# Patient Record
Sex: Male | Born: 1945 | Race: White | Hispanic: No | Marital: Married | Smoking: Former smoker
Health system: Southern US, Community
[De-identification: ages and names within clinical notes are randomized; demographics above are authoritative.]

## PROBLEM LIST (undated history)

## (undated) DIAGNOSIS — I2510 Atherosclerotic heart disease of native coronary artery without angina pectoris: Secondary | ICD-10-CM

## (undated) DIAGNOSIS — I10 Essential (primary) hypertension: Secondary | ICD-10-CM

## (undated) DIAGNOSIS — M889 Osteitis deformans of unspecified bone: Secondary | ICD-10-CM

## (undated) DIAGNOSIS — R42 Dizziness and giddiness: Secondary | ICD-10-CM

## (undated) DIAGNOSIS — Z860100 Personal history of colon polyps, unspecified: Secondary | ICD-10-CM

## (undated) DIAGNOSIS — D649 Anemia, unspecified: Secondary | ICD-10-CM

## (undated) DIAGNOSIS — K529 Noninfective gastroenteritis and colitis, unspecified: Secondary | ICD-10-CM

## (undated) DIAGNOSIS — K219 Gastro-esophageal reflux disease without esophagitis: Secondary | ICD-10-CM

## (undated) DIAGNOSIS — M549 Dorsalgia, unspecified: Secondary | ICD-10-CM

## (undated) DIAGNOSIS — M2550 Pain in unspecified joint: Secondary | ICD-10-CM

## (undated) DIAGNOSIS — E785 Hyperlipidemia, unspecified: Secondary | ICD-10-CM

## (undated) DIAGNOSIS — I219 Acute myocardial infarction, unspecified: Secondary | ICD-10-CM

## (undated) DIAGNOSIS — Z8709 Personal history of other diseases of the respiratory system: Secondary | ICD-10-CM

## (undated) DIAGNOSIS — J45909 Unspecified asthma, uncomplicated: Secondary | ICD-10-CM

## (undated) DIAGNOSIS — K649 Unspecified hemorrhoids: Secondary | ICD-10-CM

## (undated) DIAGNOSIS — H269 Unspecified cataract: Secondary | ICD-10-CM

## (undated) DIAGNOSIS — J069 Acute upper respiratory infection, unspecified: Secondary | ICD-10-CM

## (undated) DIAGNOSIS — R0602 Shortness of breath: Secondary | ICD-10-CM

## (undated) DIAGNOSIS — M1990 Unspecified osteoarthritis, unspecified site: Secondary | ICD-10-CM

## (undated) DIAGNOSIS — J449 Chronic obstructive pulmonary disease, unspecified: Secondary | ICD-10-CM

## (undated) DIAGNOSIS — K5790 Diverticulosis of intestine, part unspecified, without perforation or abscess without bleeding: Secondary | ICD-10-CM

## (undated) HISTORY — PX: BACK SURGERY: SHX140

## (undated) HISTORY — PX: OTHER SURGICAL HISTORY: SHX169

## (undated) HISTORY — PX: CARPAL TUNNEL RELEASE: SHX101

## (undated) HISTORY — PX: ESOPHAGOGASTRODUODENOSCOPY: SHX1529

## (undated) HISTORY — PX: JOINT REPLACEMENT: SHX530

## (undated) HISTORY — PX: APPENDECTOMY: SHX54

## (undated) HISTORY — PX: ABDOMINAL EXPLORATION SURGERY: SHX538

## (undated) HISTORY — PX: CORONARY ANGIOPLASTY: SHX604

## (undated) HISTORY — PX: HERNIA REPAIR: SHX51

## (undated) HISTORY — DX: Atherosclerotic heart disease of native coronary artery without angina pectoris: I25.10

## (undated) HISTORY — PX: COLONOSCOPY: SHX174

## (undated) HISTORY — PX: DENTAL SURGERY: SHX609

---

## 2000-05-30 ENCOUNTER — Encounter: Payer: Self-pay | Admitting: Oral Surgery

## 2000-05-30 ENCOUNTER — Ambulatory Visit (HOSPITAL_COMMUNITY): Admission: RE | Admit: 2000-05-30 | Discharge: 2000-05-30 | Payer: Self-pay | Admitting: Oral Surgery

## 2000-11-27 ENCOUNTER — Ambulatory Visit (HOSPITAL_BASED_OUTPATIENT_CLINIC_OR_DEPARTMENT_OTHER): Admission: RE | Admit: 2000-11-27 | Discharge: 2000-11-27 | Payer: Self-pay | Admitting: Orthopedic Surgery

## 2001-06-02 ENCOUNTER — Ambulatory Visit (HOSPITAL_BASED_OUTPATIENT_CLINIC_OR_DEPARTMENT_OTHER): Admission: RE | Admit: 2001-06-02 | Discharge: 2001-06-02 | Payer: Self-pay | Admitting: Orthopedic Surgery

## 2002-01-07 ENCOUNTER — Encounter: Payer: Self-pay | Admitting: Orthopedic Surgery

## 2002-01-12 ENCOUNTER — Inpatient Hospital Stay (HOSPITAL_COMMUNITY): Admission: RE | Admit: 2002-01-12 | Discharge: 2002-01-16 | Payer: Self-pay | Admitting: Orthopedic Surgery

## 2002-01-12 HISTORY — PX: TOTAL KNEE ARTHROPLASTY: SHX125

## 2002-01-20 ENCOUNTER — Emergency Department (HOSPITAL_COMMUNITY): Admission: EM | Admit: 2002-01-20 | Discharge: 2002-01-20 | Payer: Self-pay | Admitting: Emergency Medicine

## 2002-01-20 ENCOUNTER — Encounter: Payer: Self-pay | Admitting: Emergency Medicine

## 2002-09-17 DIAGNOSIS — I219 Acute myocardial infarction, unspecified: Secondary | ICD-10-CM

## 2002-09-17 HISTORY — DX: Acute myocardial infarction, unspecified: I21.9

## 2003-08-07 ENCOUNTER — Inpatient Hospital Stay (HOSPITAL_COMMUNITY): Admission: EM | Admit: 2003-08-07 | Discharge: 2003-08-10 | Payer: Self-pay | Admitting: Emergency Medicine

## 2003-09-01 ENCOUNTER — Ambulatory Visit (HOSPITAL_COMMUNITY): Admission: RE | Admit: 2003-09-01 | Discharge: 2003-09-02 | Payer: Self-pay | Admitting: *Deleted

## 2004-09-22 ENCOUNTER — Ambulatory Visit: Payer: Self-pay | Admitting: *Deleted

## 2005-05-23 ENCOUNTER — Ambulatory Visit: Payer: Self-pay | Admitting: Cardiology

## 2005-05-29 ENCOUNTER — Ambulatory Visit: Payer: Self-pay

## 2006-05-22 ENCOUNTER — Ambulatory Visit: Payer: Self-pay | Admitting: Cardiology

## 2006-05-23 ENCOUNTER — Ambulatory Visit: Payer: Self-pay | Admitting: Cardiology

## 2007-05-21 ENCOUNTER — Ambulatory Visit: Payer: Self-pay | Admitting: Cardiology

## 2007-05-29 ENCOUNTER — Ambulatory Visit: Payer: Self-pay | Admitting: Cardiology

## 2007-06-03 ENCOUNTER — Ambulatory Visit: Payer: Self-pay

## 2007-06-03 ENCOUNTER — Ambulatory Visit: Payer: Self-pay | Admitting: Cardiology

## 2007-06-09 ENCOUNTER — Inpatient Hospital Stay (HOSPITAL_BASED_OUTPATIENT_CLINIC_OR_DEPARTMENT_OTHER): Admission: RE | Admit: 2007-06-09 | Discharge: 2007-06-09 | Payer: Self-pay | Admitting: Cardiology

## 2007-06-09 ENCOUNTER — Ambulatory Visit: Payer: Self-pay | Admitting: Cardiology

## 2007-06-16 ENCOUNTER — Ambulatory Visit: Payer: Self-pay | Admitting: Cardiology

## 2008-05-19 ENCOUNTER — Ambulatory Visit: Payer: Self-pay | Admitting: Cardiology

## 2009-03-17 ENCOUNTER — Telehealth: Payer: Self-pay | Admitting: Cardiology

## 2009-03-24 ENCOUNTER — Encounter (INDEPENDENT_AMBULATORY_CARE_PROVIDER_SITE_OTHER): Payer: Self-pay | Admitting: *Deleted

## 2009-04-07 ENCOUNTER — Telehealth: Payer: Self-pay | Admitting: Cardiology

## 2009-04-08 ENCOUNTER — Encounter: Payer: Self-pay | Admitting: Cardiology

## 2009-05-09 DIAGNOSIS — M129 Arthropathy, unspecified: Secondary | ICD-10-CM | POA: Insufficient documentation

## 2009-05-09 DIAGNOSIS — K219 Gastro-esophageal reflux disease without esophagitis: Secondary | ICD-10-CM | POA: Insufficient documentation

## 2009-06-06 ENCOUNTER — Ambulatory Visit: Payer: Self-pay | Admitting: Cardiology

## 2009-09-15 ENCOUNTER — Telehealth: Payer: Self-pay | Admitting: Cardiology

## 2009-10-31 ENCOUNTER — Ambulatory Visit: Payer: Self-pay | Admitting: Cardiology

## 2009-10-31 ENCOUNTER — Encounter (HOSPITAL_COMMUNITY): Admission: RE | Admit: 2009-10-31 | Discharge: 2009-11-30 | Payer: Self-pay | Admitting: Cardiology

## 2009-11-01 ENCOUNTER — Encounter (INDEPENDENT_AMBULATORY_CARE_PROVIDER_SITE_OTHER): Payer: Self-pay | Admitting: *Deleted

## 2010-05-04 ENCOUNTER — Telehealth (INDEPENDENT_AMBULATORY_CARE_PROVIDER_SITE_OTHER): Payer: Self-pay | Admitting: *Deleted

## 2010-06-12 ENCOUNTER — Telehealth (INDEPENDENT_AMBULATORY_CARE_PROVIDER_SITE_OTHER): Payer: Self-pay | Admitting: *Deleted

## 2010-07-10 ENCOUNTER — Ambulatory Visit: Payer: Self-pay | Admitting: Cardiology

## 2010-07-10 DIAGNOSIS — I2510 Atherosclerotic heart disease of native coronary artery without angina pectoris: Secondary | ICD-10-CM

## 2010-10-19 NOTE — Assessment & Plan Note (Signed)
Summary: W2N   Primary Provider:  Dr. Wyvonnia Lora   History of Present Illness: [name] comes in today for further evaluation and management of his coronary artery disease.  He developed severe bronchitis and had to be admitted to [facility] in February. This was shortly after he had a stress test here. His stress test was remarkable for good LV function, no ischemia, and no infarction. His ejection fraction was 61%. He had a gastrointestinal bug the night before and got nauseated and possibly aspirated after the study. Chest x-ray our showed no infiltrate on admission to [facility].  He is having no angina or ischemic symptoms. He may have had total knee replacement in the future.  He's had some aching and thinks it may be simvastatin. Hip problem the past with Lipitor. He would rather stay on the medication this time.  He is very compliant with his medications. His wife confirms this.  Preventive Screening-Counseling & Management  Alcohol-Tobacco     Smoking Status: quit  Current Medications (verified): 1)  Metoprolol Tartrate 25 Mg Tabs (Metoprolol Tartrate) .... Take 1/2 Tablet By Mouth Twice A Day 2)  Nitroglycerin 0.4 Mg Subl (Nitroglycerin) .... Place 1 Tablet Under Tongue As Directed 3)  Prilosec 40 Mg Cpdr (Omeprazole) .... Take 1 Tablet By Mouth Once A Day 4)  Aspirin Ec 325 Mg Tbec (Aspirin) .... Take One Tablet By Mouth Daily 5)  Multivitamins  Tabs (Multiple Vitamin) .... Take 1 Tablet By Mouth Once A Day 6)  Cozaar 50 Mg Tabs (Losartan Potassium) .... Take 1 Tablet By Mouth Once A Day' 7)  Simvastatin 40 Mg Tabs (Simvastatin) .... Take 1 Tablet By Mouth Once Daily  Allergies (verified): 1)  ! * Codiene  Past History:  Past Medical History: Last updated: May 14, 2009 Current Problems:  GERD (ICD-530.81) ARTHRITIS (ICD-716.90)  Past Surgical History: Last updated: 2009/05/14 myocardial infarction cath with stenting   Family History: Last  updated: 2009-05-14 Father:deceased cause unknown Mother:deceased cause unknown  Social History: Last updated: 14-May-2009 Full Time Married  Tobacco Use - No.  Alcohol Use - no Regular Exercise - no Drug Use - no  Risk Factors: Exercise: no (May 14, 2009)  Risk Factors: Smoking Status: quit (07/10/2010)  Social History: Smoking Status:  quit  Review of Systems       negative other than history of present illness  Vital Signs:  Patient profile:   65 year old male Height:      70 inches Weight:      208 pounds BMI:     29.95 O2 Sat:      95 % on Room air Pulse rate:   63 / minute BP sitting:   136 / 78  (left arm)  Vitals Entered ByLarita Fife Via LPN (July 10, 2010 1:51 PM)  O2 Flow:  Room air  Physical Exam  General:  Well developed, well nourished, in no acute distress. Head:  normocephalic and atraumatic Eyes:  PERRLA/EOM intact; conjunctiva and lids normal. Neck:  Neck supple, no JVD. No masses, thyromegaly or abnormal cervical nodes. Chest Wall:  no deformities or breast masses noted Lungs:  Clear bilaterally to auscultation and percussion. Heart:  PMI nondisplaced, normal S1-S2, no murmur or gallop. Carotids equal bilaterally without bruit Abdomen:  positive bowel sounds, soft, no bruit Msk:  Back normal, normal gait. Muscle strength and tone normal. Pulses:  pulses normal in all 4 extremities Extremities:  No clubbing or cyanosis. Neurologic:  Alert and oriented x 3. Skin:  Intact without lesions or rashes. Psych:  Normal affect.   Problems:  Medical Problems Added: 1)  Dx of Cad, Native Vessel  (ICD-414.01)  Impression & Recommendations:  Problem # 1:  CAD, NATIVE VESSEL (ICD-414.01) continue medical therapy, followup in a year. His updated medication list for this problem includes:    Metoprolol Tartrate 25 Mg Tabs (Metoprolol tartrate) .Marland Kitchen... Take 1/2 tablet by mouth twice a day    Nitroglycerin 0.4 Mg Subl (Nitroglycerin) .Marland Kitchen... Place 1 tablet  under tongue as directed    Aspirin Ec 325 Mg Tbec (Aspirin) .Marland Kitchen... Take one tablet by mouth daily  Patient Instructions: 1)  Your physician recommends that you schedule a follow-up appointment in: 12 months 2)  Your physician recommends that you continue on your current medications as directed. Please refer to the Current Medication list given to you today. Prescriptions: METOPROLOL TARTRATE 25 MG TABS (METOPROLOL TARTRATE) Take 1/2 tablet by mouth twice a day  #30 x 11   Entered by:   Larita Fife Via LPN   Authorized by:   Gaylord Shih, MD, [facility]   Signed by:   Larita Fife Via LPN on 16/06/9603   Method used:   Electronically to        [facility] [address] (retail)       [address]       [place], [place]  [address]       Ph: [phone] or [phone]       Fax: [phone]   RxID:   [id number] SIMVASTATIN 40 MG TABS (SIMVASTATIN) take 1 tablet by mouth once daily  #30 x 11   Entered by:   Larita Fife Via LPN   Authorized by:   Gaylord Shih, MD, [facility]   Signed by:   Larita Fife Via LPN on 40/34/7425   Method used:   Electronically to        [facility] [address] (retail)       [address]       [place], [place]  [address]       Ph: [phone] or [phone]       Fax: [phone]   RxID:   [id number]   Prevention & Chronic Care Immunizations   Influenza vaccine: Not documented    Tetanus booster: Not documented    Pneumococcal vaccine: Not documented    H. zoster vaccine: Not documented  Colorectal Screening   Hemoccult: Not documented    Colonoscopy: Not documented  Other Screening   PSA: Not documented   Smoking status: quit  (07/10/2010)    Screening comments: quit in 2000 at age 52  Lipids   Total Cholesterol: Not documented   LDL: Not documented   LDL Direct: Not documented   HDL: Not documented   Triglycerides: Not documented

## 2010-10-19 NOTE — Progress Notes (Signed)
Summary: simvastatin refill  Phone Note Call from Patient Call back at Home Phone [phone] Call back at [phone]   Caller: pt Reason for Call: Refill Medication Summary of Call: pt uses [facility] and needs his simvastatin 40mg  called in, has been trying to get from pharmacy since monday. Initial call taken by: Faythe Ghee,  May 04, 2010 2:50 PM    Prescriptions: SIMVASTATIN 40 MG TABS (SIMVASTATIN) take 1 tablet by mouth once daily  #30 x 3   Entered by:   Teressa Lower RN   Authorized by:   Gaylord Shih, MD, [facility]   Signed by:   Teressa Lower RN on 05/04/2010   Method used:   Electronically to        [facility] [address] (retail)       [address]       [place], [place]  [address]       Ph: [phone] or [phone]       Fax: [phone]   RxID:   [id number]

## 2010-10-19 NOTE — Progress Notes (Signed)
  Phone Note Call from Patient   Caller: [name] [name],wife Reason for Call: Talk to Nurse Summary of Call: [name] [name] said the pharmacy has not heard back form their request to refill "Metoprolot tarrate 25mg .  [name] [name] said that he has refills left but the date ran out.  He uses [facility] at [place], [phone].  [name] [name] can be reached at [phone]. [name] [name] can be reached at [phone].    Prescriptions: METOPROLOL TARTRATE 25 MG TABS (METOPROLOL TARTRATE) Take 1/2 tablet by mouth twice a day  #30 x 1   Entered by:   Teressa Lower RN   Authorized by:   Gaylord Shih, MD, [facility]   Signed by:   Teressa Lower RN on 06/12/2010   Method used:   Electronically to        [facility] [address] (retail)       [address]       [place], [place]  [address]       Ph: [phone] or [phone]       Fax: [phone]   RxID:   [id number]

## 2010-10-19 NOTE — Letter (Signed)
Summary: [place] [facility] at [facility]  [address] [place] [address]   Phone: [phone]  Fax: [phone]      November 01, 2009 MRN: [id number]   [name] [name] [address] [place], [place]  [address]   Dear Mr. Mabey,  Your test ordered by Selena Batten has been reviewed by your physician (or physician assistant) and was found to be normal or stable. Your physician (or physician assistant) felt no changes were needed at this time.  ____ Echocardiogram  __x__ Cardiac Stress Test  ____ Lab Work  ____ Peripheral vascular study of arms, legs or neck  ____ CT scan or X-ray  ____ Lung or Breathing test  ____ Other:  No change in medical treatment at this time, excellent per Dr. Juanito Doom.  Thank you, Caspian Deleonardis Allyne Gee RN    Chewton Bing, MD, Lenise Arena.C.Gaylord Shih, MD, F.A.C.C Lewayne Bunting, MD, F.A.C.C Nona Dell, MD, F.A.C.C Charlton Haws, MD, Lenise Arena.C.C

## 2010-11-24 ENCOUNTER — Ambulatory Visit (INDEPENDENT_AMBULATORY_CARE_PROVIDER_SITE_OTHER): Payer: Self-pay | Admitting: Emergency Medicine

## 2010-11-24 DIAGNOSIS — N452 Orchitis: Secondary | ICD-10-CM

## 2011-01-30 NOTE — Assessment & Plan Note (Signed)
[facility]                            CARDIOLOGY OFFICE NOTE   [name] [name], [name] [name]                       MRN:          [id number]  DATE:06/16/2007                            DOB:          May 28, 1946    [name] [name] comes in today post catheterization.  He was having some  dyspnea on exertion and some chest tightness.  He had a low risk Myoview  with what appeared to be some question of inferior wall ischemia.  He  had previous stenting of his circumflex and LAD.  He underwent an  outpatient cardiac cath which showed patent stents both in the  circumflex and the LAD.  He had nonobstructive plaque otherwise.  His EF  was 60% with minimal mitral regurgitation.  He feels much better knowing  that his coronaries are doing well.   MEDICATIONS:  Unchanged.   PHYSICAL EXAMINATION:  VITAL SIGNS:  His blood pressure today is 140/72,  pulse is 72 and regular, his weight is 202.  HEENT:  Unchanged.  NECK:  Carotids are full without bruits.  There is no JVD.  LUNGS:  Clear.  HEART:  Reveals a poorly appreciated PMI.  He has a normal S1 S2.  ABDOMEN:  Soft.  Cath site is stable.  EXTREMITIES:  Reveal no edema.  Pulses are intact.  NEUROLOGIC:  Intact.   [name] [name] is stable from a cardiovascular standpoint.  We made no  changes in his medical program.  We will see him back in a year.     Thomas C. Daleen Squibb, MD, [facility]  Electronically Signed    TCW/MedQ  DD: 06/16/2007  DT: 06/16/2007  Job #: [id number]   cc:   Wyvonnia Lora

## 2011-01-30 NOTE — Assessment & Plan Note (Signed)
[facility]                            CARDIOLOGY OFFICE NOTE   VIRAJ, LIBY                       MRN:          [id number]  DATE:05/21/2007                            DOB:          1945/11/18    SUBJECTIVE:  Mr. Meddings returns today for followup.  He is concerned  about the following issues:  He has had increasing dyspnea on exertion  over the last several months.  His wife has noted it and now he is  noting it.  He has had no angina per se.   PAST MEDICAL HISTORY:  1. Coronary artery disease with anterior wall infarction in November      2004.  At that time he had a drug-eluting stent to the distal LAD,      and then he had a subsequent procedure and sent to the circumflex.      His last stress test was in 2006, which showed an ejection fraction      of 63%, a small apical infarction, no ischemia.  2. Hyperlipidemia, followed by Dr. Wyvonnia Lora.  3. Hypertension, followed by Dr. Margo Common.  4. It should be noted that he had a heavy smoking history until about      eight years ago.   MEDICATIONS:  1. Aspirin 325 mg daily.  2. Nexium 40 mg daily.  3. Vytorin 10/40 mg daily.  4. Altace 2.5 mg daily.  5. Metoprolol 12.5 mg twice daily.  6. Multivitamin daily.  7. Calcium and vitamin D daily.   He has his blood work checked by Dr. Margo Common.   PHYSICAL EXAMINATION:  VITAL SIGNS:  Blood pressure 116/71, pulse 56 and  regular.  His electrocardiogram shows sinus bradycardia with no other  changes.  Weight is 202 pounds, which is stable.  HEENT:  Normocephalic and atraumatic.  Pupils equal, round, reactive to  light and accommodation.  Extraocular movements intact.  Sclerae clear.  Facial symmetry is normal.  NECK:  Supple, carotids upstrokes equal bilaterally without bruits.  No  jugular venous distention.  CHEST:  Lungs clear.  HEART:  His PMI is not displaced.  He has a normal S1 and S2.  ABDOMEN:  Soft, with good bowel sounds.  There is no  midline bruit.  There is no hepatosplenomegaly.  EXTREMITIES:  No clubbing, cyanosis or edema.  Pulses brisk.  NEUROLOGIC:  Intact exam.  SKIN:  Unremarkable.   ASSESSMENT:  Increasing dyspnea on exertion.  Some of this may be  chronic obstructive pulmonary disease, but we need to rule out  obstructive coronary disease.  We will do a stress Myoview as well.   PLAN:  1. Exercise rest stress Myoview.  2. If this shows any significant ischemia,  he will be referred for a      cardiac catheterization.  3. He is to continue the current medications.     Thomas C. Daleen Squibb, MD, [facility]  Electronically Signed    TCW/MedQ  DD: 05/21/2007  DT: 05/21/2007  Job #: [id number]   cc:   Wyvonnia Lora

## 2011-01-30 NOTE — Cardiovascular Report (Signed)
NAME:  MURDOCK, JELLISON NO.:  [id number]   MEDICAL RECORD NO.:  [id number]          PATIENT TYPE:  OIB   LOCATION:  1961                         FACILITY:  [facility]   PHYSICIAN:  Jonelle Sidle, MD DATE OF BIRTH:  [birth year]   DATE OF PROCEDURE:  DATE OF DISCHARGE:                            CARDIAC CATHETERIZATION   INDICATIONS:  [name] [name] is a 65 year old male with hypertension,  hyperlipidemia, and coronary artery disease, status post previous  anterior wall myocardial infarction in 2004, status post drug-eluting  stent placement to the left anterior descending, and subsequently status  post stent placement to the proximal circumflex.  He has had recent  dyspnea on exertion as well as chest pain and was referred for a Myoview  study which was mildly abnormal, indicating an overall ejection fraction  of 61%, with some ventricular ectopy and possible inferior ischemia.  After reviewing the potential risks and benefits, the patient is  referred now for diagnostic cardiac catheterization to clearly outline  the coronary anatomy, assess stent patency, and evaluate for any  potential revascularizations options.  Informed consent was obtained.   PROCEDURES PERFORMED:  1. Left heart catheterization  2. Selective coronary angiography.  3. Left ventriculography.   ACCESS AND EQUIPMENT:  The area about the right femoral artery was  anesthetized with 1% lidocaine, and a 4-French sheath was placed in the  right femoral artery via the modified Seldinger technique.  Standard  preformed 4-French JL-4 and 3D RC catheters were used for selective  coronary angiography, and an angled pigtail catheter was used for left  heart catheterization and left ventriculography.  A total of 95 cc of  Omnipaque were used.  The patient tolerated the procedure well, without  immediate complications.  All exchanges were made over a wire.   HEMODYNAMICS:  Aorta 107/52 mmHg.  Left ventricle  111/10 mmHg.   ANGIOGRAPHIC FINDINGS:  1. The left main coronary artery is free of significant flow-limiting      coronary atherosclerosis and gives rise to the left anterior      descending and circumflex vessels.  2. The left anterior descending is a medium-caliber vessel that tapers      towards the distal aspect.  There is mild 20% atherosclerotic      plaque within the proximal portion of the vessel.  A stent is      visualized within the midportion of the vessel, around the takeoff      of a large diagonal branch and also the septal perforator.  There      is mild atherosclerosis within the stent to approximately 20%,      although no flow-limiting stenosis is noted.  The ostium of the      diagonal branch is stenosed at approximately 50%.  There is also      distally within the diagonal approximately 20% stenosis.  The      origin of the septal perforator is pinched in the area of the      stent, likely an old finding.  3. The circumflex vessel is medium in caliber..  There are  three      obtuse marginal branches.  A stent is visualized in the proximal      portion of the vessel.  There is 20%-30% stenosis noted in the      midportion of the circumflex distal to the stent.  There is      approximately 30% stenosis also within the third obtuse marginal      proximal portion.  No flow-limiting stenoses are noted.  4. The right coronary artery is a medium to large-caliber dominant      vessel, with large posterior descending branch.  Minor luminal      irregularities are noted throughout the vessel predominately in the      mid and distal portion.  These areas are in the 20%-30% range.      There are no critical flow-limiting stenoses noted.   Ventriculography is performed in the RAO projection and reveals an  ejection fraction of approximately 60%.  There is 1+ mitral  regurgitation noted.   DIAGNOSES:  1. Coronary disease as outlined.  Stent sites within the mid-left       anterior descending and proximal circumflex are patent.  There is      approximately 20% stenosis within the left anterior descending      stent, although no critical flow-limiting stenoses are noted.  Mild      atherosclerotic plaque is noted within the right coronary artery.  2. Left ventricular ejection fraction approximately 60%, with 1+      mitral regurgitation, and a ventricular end-diastolic pressure of      10 mmHg.   DISCUSSION:  I reviewed the results with the patient, his family, and  also with Dr. Daleen Squibb by phone.  No obstructive culprit stenoses are noted  as a clear explanation of the patient's symptoms.  No clear need for  revascularization at this time.  I would anticipate aggressive medical  therapy and close outpatient followup.  He will be scheduled to see Dr.  Daleen Squibb back in the office.      Jonelle Sidle, MD  Electronically Signed     SGM/MEDQ  D:  06/09/2007  T:  06/09/2007  Job:  [id number]   cc:   Thomas C. Daleen Squibb, MD, [facility]  Wyvonnia Lora

## 2011-01-30 NOTE — Assessment & Plan Note (Signed)
[facility]                            CARDIOLOGY OFFICE NOTE   [name], [name]                       MRN:          [id number]  DATE:06/03/2007                            DOB:          [birth year]    HISTORY AND PHYSICAL EXAMINATION FOR OUTPATIENT JV CATH LAB   HISTORY OF PRESENT ILLNESS:  Chest tightness.   [name] [name] returned today after being evaluated here on May 21, 2007 for dyspnea on exertion. He was admitted to [facility] last Thursday with chest tightness. He ruled out for myocardial  infarction. His Nexium was increased to 40 mg two times a day and his  aspirin was changed to Plavix. He was told to followup with me.   We performed a stress Myoview on June 03, 2007. This demonstrated  him to exercise for 9 minutes. MET level achieved was 10.1, percent  maximum heart rate was 98%. He had nonspecific ST segment changes. He  had a three beat run and nonsustained VTach. His images were reviewed  closely with Dr.  Myrtis Ser who felt like he had an apical defect which had  been there before, but now it appeared that he had some inferior  ischemia as well. His overall EF is 61%. We compared these to previous  images.   PAST MEDICAL HISTORY:  1. Coronary artery disease with an anterior wall infarction in      November 2004. He had a drug-eluting stent to the distal left      anterior descending artery and then he had a subsequent procedure      with a stent to the circumflex.  2. Hyperlipidemia.  3. Hypertension.  4. Heavy smoking history until about 8 years ago.   MEDICATIONS:  1. Aspirin 325 a day.  2. Nexium 40 mg a day.  3. Vytorin 10/40 daily.  4. Altace 2.5 mg a day.  5. Metoprolol 12.5 mg p.o. b.i.d.  6. Multivitamin daily.  7. Calcium and vitamin D daily.   ALLERGIES:  CODEINE.   SOCIAL HISTORY:  He lives in [place]. He is married. He does not drink  alcohol.   FAMILY HISTORY:  Is significant for  coronary disease in a brother and  his mother.   PHYSICAL EXAMINATION:  A very pleasant gentleman in no acute distress.  His blood pressure is 118/78, pulse 70 and regular. Weight is 196.  HEENT: Normocephalic, atraumatic. PERRLA. Extra-ocular movements intact.  Sclerae are clear. Facial symmetry is normal.  NECK: Supple. Carotid upstrokes are equal bilaterally without bruits.  There is no JVD.  LUNGS:  Are clear.  HEART: Reveals a nondisplaced PMI. He has a normal S1, S2.  regular rate  and rhythm. There is no gallop, rub, or murmur.  ABDOMEN: Soft with good bowel sounds. There is no midline bruit. There  is no hepatosplenomegaly.  EXTREMITIES: Reveals no cyanosis, clubbing or edema. Pulses are brisk.  NEURO: Intact.  SKIN: Unremarkable.   Electrocardiogram shows sinus bradycardia, otherwise normal.   ASSESSMENT:  Chest tightness and dyspnea on exertion with a finding of  inferior wall ischemia  though minimal on recent Myoview. Rule out  progressive disease, obstructive disease.   PLAN:  Outpatient JV cath. Indications, risks, and potential benefits  have been discussed with the patient. He agrees to proceed.   His pre-cath lab values show a hemoglobin  of 12.7, platelets 398,000.  PT and PTT are normal. I do not have a Chem-7 at this time.     Thomas C. Daleen Squibb, MD, [facility]  Electronically Signed    TCW/MedQ  DD: 06/04/2007  DT: 06/04/2007  Job #: [id number]   cc:   [name] [name]

## 2011-01-30 NOTE — Assessment & Plan Note (Signed)
[facility]                       [place] CARDIOLOGY OFFICE NOTE   JOSEPHINE, RUDNICK                       MRN:          [id number]  DATE:06/06/2009                            DOB:          Mar 11, 1946    Mr. Skowron comes in today for followup.   PROBLEM LIST:  1. Coronary artery disease status post anterior wall infarct in      November 2004.  A drug-eluting stent to the left anterior      descending was placed at that time with also a subsequent planted      stent to the circumflex.  His ejection fraction is 63% with      excellent left ventricular recovery.  His last catheterization was      June 09, 2007, showed patent stents with ejection fraction of      61%.  2. Hypertension.  He continues to have a tickling in his throat with a      cough.  He stopped his ramipril as I suggested last year; but      because his pressure went up, he went back on it.  He is not sure      his cough was a lot better.  We will certainly like to try a      generic angiotensin receptor blocker now that they are available.  3. Hyperlipidemia, followed by Dr. Margo Common   He is currently having no angina or ischemic symptoms.  He is really  plagued with his knees and may have to have repeat knee surgery.   CURRENT MEDICATIONS:  1. Metoprolol tartrate 25 mg twice a day.  2. Nitroglycerin p.r.n.  3. Vytorin 10/40 once a day.  4. Prilosec 40 mg once a day.  5. Aspirin 325 mg once a day.  6. Multivitamins.  7. Calcarb.  8. Ramipril 2.5 mg per day.   PHYSICAL EXAMINATION:  VITAL SIGNS:  His blood pressure is 131/73, his  pulse is 62 and regular, sats 96%, he is 70 inches, weighs 197 pounds.  HEENT:  Unremarkable.  NECK:  Carotid upstrokes were equal bilaterally without bruits.  No JVD.  Thyroid is not enlarged.  Neck is supple.  LUNGS:  Clear to auscultation and percussion without rhonchi or wheezes.  HEART:  A nondisplaced PMI, normal S1 and S2.  No  gallop.  ABDOMEN:  Soft, good bowel sounds.  EXTREMITIES:  There is no edema.  Pulses are present.  NEURO:  Intact.   Looking through his med list, it looks like he may already been on  Cozaar 50 mg a day.   EKG shows normal sinus rhythm with PACs.  There is no acute change.   ASSESSMENT AND PLAN:  Mr. Bramel is doing well from our standpoint.  If  he needs general anesthesia for knee replacement, I will recommend a  stress Myoview prior to that procedure.  Otherwise, we will see him back  in a year.  I have reinforced taking losartan 50 mg p.o. daily and  stopping his ACE inhibitor.  i hope his cough will get better.   I have made no  other changes in his program.      Maisie Fus C. Daleen Squibb, MD, [facility]  Electronically Signed    TCW/MedQ  DD: 06/06/2009  DT: 06/07/2009  Job #: [id number]   cc:   Wyvonnia Lora

## 2011-01-30 NOTE — Assessment & Plan Note (Signed)
[facility] HEALTHCARE                       [place] CARDIOLOGY OFFICE NOTE   [name] [name], [name] [name]                       MRN:          [id number]  DATE:05/19/2008                            DOB:          Oct 29, 1945    [name] [name] comes in for followup today.  He has been feeling remarkably  well.  He has had no angina or dyspnea on exertion.   PAST MEDICAL HISTORY:  His past medical history is significant for the  following;  Coronary artery disease status post anterior wall infarct in November  2004.  At that time, he had a drug-eluting stent to the distal LAD and a  subsequent stent to the circumflex.  His EF improved to normal at 63%.  He had a catheterization last year because of increasing dyspnea on  exertion.  There was also a question of some inferior wall ischemia on a  low-risk Myoview.  His cath showed patent stents with an EF of 61% on  June 09, 2007.   He has been having a tickling in his throat and a cough.  Some of this  may be allergies his wife says.  He has been on ramipril for a long time  at 2.5 mg a day.  His blood pressure is also low today at 97, but he  says it usually runs around 110-120.   CURRENT MEDICATIONS:  1. Enteric-coated aspirin 325 a day.  2. Metoprolol 12.5 b.i.d.  3. Multivitamin.  4. Calcium.  5. Vitamin D.  6. Nexium 40 mg a day.  7. Vytorin 10/40.  8. Ramipril 2.5 mg daily.  9. Nitroglycerin p.r.n.   PHYSICAL EXAMINATION:  VITAL SIGNS:  His blood pressure is 98/64; his  pulse is 68 and regular; and his weight is 197, down 5.  HEENT:  Normocephalic and atraumatic.  PERRLA.  Extraocular movements  are intact.  Sclerae are clear.  Facial symmetry is normal.  NECK:  Carotid upstrokes were equal bilaterally without bruits.  No JVD.  Thyroid is not enlarged.  Neck is supple.  LUNGS:  Clear to auscultation and percussion.  HEART:  Soft S1 and S2 with nondisplaced PMI.  ABDOMEN:  Soft, good bowel sounds.  No  midline bruit.  No hepatomegaly.  EXTREMITIES:  No cyanosis, clubbing, or edema.  Pulses are intact.  NEURO:  Intact.   I had a long talk with [name] [name].  We will stop the ramipril for a  couple of weeks to see if this tickling in his throat and cough  disappears.  If it does and his pressures running 120 on the average  systolically, we will continue off the ramipril.  If he does not improve  his cough, I told him to go back on it and to watch his blood pressure.   We have made no other changes.  We will see him back again in a year.     Thomas C. Daleen Squibb, MD, [facility]  Electronically Signed    TCW/MedQ  DD: 05/19/2008  DT: 05/20/2008  Job #: [id number]   cc:   Wyvonnia Lora

## 2011-02-02 NOTE — Cardiovascular Report (Signed)
NAME:  [name] [name], [name] [name]                        ACCOUNT NO.:  [id number]   MEDICAL RECORD NO.:  [id number]                   PATIENT TYPE:  OIB   LOCATION:  2858                                 FACILITY:  [facility]   PHYSICIAN:  Veneda Melter, M.D.                   DATE OF BIRTH:  03-07-1946   DATE OF PROCEDURE:  09/01/2003  DATE OF DISCHARGE:                              CARDIAC CATHETERIZATION   PROCEDURES PERFORMED:  Percutaneous transluminal coronary angioplasty and  stent placement to AV circumflex.   DIAGNOSES:  Coronary atherosclerotic disease.   HISTORY:  Mr. Pavey is a 65 year old white male with a known history of  coronary artery disease who previously suffered anterior wall myocardial  infarction on August 07, 2003 with acute stent placement to the mid LAD.  The patient had high grade residual disease in the left circumflex system  for which he presents for staged intervention.   TECHNIQUE:  Informed consent was obtained.  The patient brought to the  catheterization lab.  A 6 French sheath was placed in the right femoral  artery using the modified Seldinger technique.  A 6 [place] Voda left 3.5  guide catheter was used to engage the left coronary artery and selective  guide shots obtained.  This confirmed patency of the distal LAD stent.  The  AV circumflex began as a large caliber vessel and had high grade narrowing  of 70% prior to a small first marginal branch.  The distal circumflex artery  then had severe disease of 70% after a second marginal branch.  The second  marginal branch had an ostial narrowing of 90%.  The patient was enrolled in  the CP study and anticoagulated per protocol.  A 0.014-inch Forte wire was  advanced in the proximal circumflex and used to size lesion length and  advance distally.  A 3.0 x 8-mm Cypher drug-eluting stent was introduced and  carefully positioned in the proximal AV circumflex and deployed at 10  atmospheres for 30 seconds.  Repeat  angiography showed slight distal  movement of the stent with residual moderate stenosis proximally.  An  attempt was then made to position the second 3.0 x 8-mm stent.  However,  this would not advance through the first stent and a 3.0 x 12-mm Quantum  Maverick balloon was used to post dilate the initial stent at 14 atmospheres  for 30 seconds.  The second 3.0 x 8-mm Cypher stent was then introduced and  could be advanced into the proximal AV circumflex.  It was overlapped to the  previously placed stent and deployed at 10 atmospheres for 30 seconds.  The  3.0 x 12-mm Quantum Maverick balloon was then used to post dilate the  proximal stent at 16 atmospheres for 30 seconds.  Repeat angiography after  administration of intracoronary nitroglycerin showed an excellent result  with now full coverage of the lesion.  No residual stenosis  and TIMI-3 flow  distally.  There was no distal vessel damage.  The guide catheter was then  removed and the sheath secured in position.  The patient tolerated procedure  well and was transferred to the floor in stable condition.   FINAL RESULTS:  Successful percutaneous transluminal coronary angioplasty  and stent placement in proximal AV circumflex with reduction of 70%  narrowing to 0% with placement of a 3.0 x 8-mm Cypher drug-eluting stent  followed by a second 3.0 x 8-mm Cypher stent.   ASSESSMENT AND PLAN:  Mr. Mich is a 65 year old gentleman with severe two-  vessel coronary artery disease who has undergone percutaneous intervention  of the LAD as well as proximal AV circumflex.  He has residual disease in  the distal circumflex which will be treated medically.  Should he have  symptoms or ischemia, percutaneous intervention may be considered.                                               Veneda Melter, M.D.    NG/MEDQ  D:  09/01/2003  T:  09/01/2003  Job:  [id number]   cc:   Wyvonnia Lora  [address]  [place]  [place] [address]  Fax: [phone]

## 2011-02-02 NOTE — Discharge Summary (Signed)
[place]  Patient:    [name] [name], [name] [name] Visit Number: [id number] MRN: [id number]          Service Type: EMS Location: [place] Attending Physician:  Cathren Laine Dictated by:   Dorthula Matas, P.A.-C. Admit Date:  01/20/2002 Discharge Date: 01/20/2002                             Discharge Summary  PRIMARY DIAGNOSIS FOR THIS ADMISSION:  End-stage degenerative joint disease of the left knee.  PROCEDURE WHILE IN HOSPITAL:  Left total knee arthroplasty.  INTRODUCTION AND HISTORY OF PRESENT ILLNESS:  The patient is a 65 year old man with end-stage arthritis of the left knee, who has failed conservative treatment with anti-inflammatory medication and physical therapy.  He has had two knee arthroscopies with positive bare bone arthritis medial compartment at time of arthroscopy.  The patient has continued to have significant pain, swelling and effusion in the left knee for well over a year.  After discussing treatment options, risks versus benefits, he wishes to proceed with a total knee arthroplasty.  ALLERGIES:  CODEINE.  MEDICATIONS AT TIME OF ADMISSION:  Bextra, Nexium and iron supplement.  SURGICAL HISTORY:  Back surgeries in 1982 and 1992.  Hernia repair in 1992. Teeth implants 2002.  Knee scopes in 2002. No difficulties with GET.  PAST MEDICAL HISTORY:  Usual childhood diseases.  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  SOCIAL HISTORY:  No tobacco times two years, previous 40 year history. Positive ethanol, two beers per year.  No IV drug abuse.  He is married and lives with his wife.  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  FAMILY HISTORY:  Mother alive at age 68, positive heart disease, diabetes and cancer.  Father deceased at age 17 with a history of COPD.  Also brother died at age 74 with an MI.  REVIEW OF SYSTEMS:  Positive glasses.  Negative dentures.  Positive  shortness of breath with exertion. Positive chest pain on occasion and the first thing in the morning and positive hemorrhoids.  Denies all others.  PHYSICAL EXAMINATION:  The patient is afebrile. Pulse is 78, respiratory rate 16, blood pressure 140/80.  He is a 5 feet 11 inches, 195 pound male. Head is normocephalic.  Ears:  TMs are clear.  Pupils are equal, round, and react to light and accommodation.  Neck is supple.  Full range of motion.  Throat is benign.  Nose is clear as well.  Chest is clear to auscultation and percussion.  Heart is regular rate and rhythm.  Abdomen is soft and nontender. No masses.  Bowel sounds are 2+.  Extremities:  Left knee range of motion 5 to 110 degrees with positive effusion, 1+, positive crepitus, stable ligaments, 5 degrees of varus deformity, otherwise neurovascularly intact.  Skin is intact.  X-ray show bone on bone changes of the left knee medial compartment.  PREOPERATIVE LABORATORY DATA:  Including CBC, CMET, chest x-ray, PT and PTT are within normal limits with the exception of EKG which showed a sinus bradycardia.  HOSPITAL COURSE:  On the day of admission, the patient was taken to the operating room at [facility] where he underwent a left total knee arthroplasty using Osteonics Scorpio components #9 left femur, #11 tibial base plate, 10 mm Scorpio spacer, #9 modified medial patella button.  All components cemented  with double batch cement, with Zinacef.  Medium Hemovac drain double arm was placed into the wound.  The patient was placed on preoperative antibiotics for the first 48 hours.  He was placed on Coumadin prophylaxis with a target INR of 1.5 to 2 per pharmacy protocol.  He was placed on CPM for early mobilization and physical therapy.  He was placed on a PCA for pain control.  First postoperative day, the patient was awake and alert in moderate pain.  Temperature maximum was 100.7. Neurovascularly intact distally,  150 cc output from the drain per shift.  Tolerating CPM without difficulty, otherwise stable.  Continued CMP use five hours a day and weight bearing as tolerated with physical therapy.  Postoperative day two, the patient was without complaint.  He had nausea and vomiting the previous day but none that particular day.  He was out of bed to the hall with PT the preceding day.  Temperature maximum 99.1, hemoglobin 10.3, INR 1.2.  Hemovac output 25 cc.  Discontinued without difficulty.  Calf was soft and nontender. He was otherwise neurovascularly intact.  He continued physical therapy.  PCA was discontinued.  Foley was discontinued.  Postoperative day three, the patient was awake and alert.  Moderate pain.  Taking p.o. pain pills without difficulty.  Vital signs were stable.  Range of motion 0 to 65 degrees. Otherwise neurovascularly intact and was voiding without difficulty.  He was improving in physical therapy but had not met all physical therapy goals and was discharged the following day.  Postoperative day four, the patient without complaint.  PT goals had been met.  He was afebrile. Vital signs were stable. INR of 1.7.  Dressing was dry. Wound was benign.  Calf was soft.  He was otherwise medically stable and improved compared to prior to surgery and he was discharged home to the care of his family.  DISCHARGE MEDICATIONS:  Tylox and Coumadin for a total of two weeks postoperatively.  He will resume his home medications.  DISCHARGE INSTRUCTIONS:  He will call for an appointment in approximately one week with Dr. Turner Daniels.  Sooner if he should have any increase in temperature, any increase in drainage or pain is not well controlled by oral pain medications.  DIET:  Regular.  ACTIVITY:  Weight bearing as tolerated with a walker.  Home Health PT for CMP and home health nursing for blood draws. Dictated by:   Dorthula Matas, P.A.-C. Attending Physician:  Cathren Laine DD:   01/26/02 TD:  01/28/02 Job: [id number]

## 2011-02-02 NOTE — H&P (Signed)
NAME:  [name] [name], [name] [name]                        ACCOUNT NO.:  [id number]   MEDICAL RECORD NO.:  [id number]                   PATIENT TYPE:  INP   LOCATION:  1827                                 FACILITY:  [facility]   PHYSICIAN:  Creta Levin, M.D. [facility]      DATE OF BIRTH:  06-16-1946   DATE OF ADMISSION:  08/07/2003  DATE OF DISCHARGE:                                HISTORY & PHYSICAL   CHIEF COMPLAINT:  Chest pain.   HISTORY OF PRESENT ILLNESS:  [name] [name] is a 65 year old male with no  significant past medical history who complains of 10/10 chest tightness that  began at approximately 3 p.m. today.  He states that he was working on his  son's house and attributed the pain to working with a posthole digger  yesterday.  His pain was associated with some shortness of breath and some  nausea.  It was also associated with a little bit of diaphoresis.  He had  one episode of chest pain yesterday, but that resolved spontaneously.   PAST MEDICAL HISTORY:  1. Arthritis.  2. Gastroesophageal reflux.   ALLERGIES:  CODEINE.   MEDICATIONS:  1. Bextra.  2. Nexium.   SOCIAL HISTORY:  He lives in [place].  Works in the Engineer, technical sales.  He is a former smoker but quit 3-4 years ago.  He does not drink  alcohol.  He does not use drugs.   FAMILY HISTORY:  Significant for coronary disease in his mother, which  started in her 36s.  He also has a brother who reportedly had an MI and died  in his 71s.   REVIEW OF SYSTEMS:  Significant for some dyspnea on exertion over the past  few months.  He also had some nausea, dyspnea, and diaphoresis, as mentioned  above.  The remainder of his 10-point review is negative except for some  joint pain.   PHYSICAL EXAMINATION:  VITAL SIGNS:  He is afebrile with a blood pressure of  110/59, heart rate 64 and regular.  He is saturating at 98% on room air.  GENERAL:  He is in minimal distress.  HEENT:  Unremarkable.  NECK:  No jugular venous  distention, bruits, or thyromegaly.  CARDIOVASCULAR:  Regular rate and rhythm without gallop, murmur or rub.  LUNGS:  Clear.  ABDOMEN:  Soft, nontender, nondistended with normal bowel sounds.  RECTAL:  Heme-negative stool.  EXTREMITIES:  No clubbing, cyanosis or edema.  NEUROLOGIC:  Nonfocal.   Chest x-ray is pending.  His ECG initially revealed a normal sinus rhythm  with only subtle ST/J-point elevation in the anterior leads.  A subsequent  ECG shows inversion of the distal T waves in his anterior leads, suspicious  for an evolving anterior MI.   LABORATORY DATA:  CK-MB was 23.8.  I do not know what the total CK was.  The  troponin I was 0.83.  His PTT is 31.  His PT is 12.8 with  an INR of 0.9.  The rest of his labs are pending.   ASSESSMENT/PLAN:  Acute anterior myocardial infarction:  Patient was given  aspirin and heparin as well as nitroglycerin and morphine in the emergency  department.  His pain has been reduced from 10/10 to 3/10.  I am giving him  a little bit of metoprolol and taking him to the cath lab.  I will hold off  on giving him Plavix or a 2B3A inhibitor until we define his anatomy.  In  addition to the above medical regimen, I am adding Lipitor 80 mg and  Captopril 6.25 mg q.8h.  The rest of this clinical course will be determined  based on his anatomy.                                                Creta Levin, M.D. [facility]    RPK/MEDQ  D:  08/07/2003  T:  08/08/2003  Job:  [id number]

## 2011-02-02 NOTE — Assessment & Plan Note (Signed)
[facility] HEALTHCARE                              CARDIOLOGY OFFICE NOTE   [name] [name], FAIVRE                       MRN:          [id number]  DATE:05/22/2006                            DOB:          March 06, 1946    Mr. Alexa returns today for further management of his coronary artery  disease.   See my note from May 23, 2005.  At that time, he had a stress Myoview  which showed apical thinning versus an infarct with EF of 60% with no  ischemia.  A remarkable recovery from an anterior wall infarct that he  suffered in 2004.   He is having no symptoms of angina.  He has no functional limitations.  His  blood work is being followed by Dr. Margo Common in [place].   His biggest problem has been persistent vertigo to the point he is falling.  He has had some hearing tests apparently which were normal.  He denies any  headaches or any other neurological symptoms.  Meclizine seems to help but  does not totally alleviate it.  He has not had a CT scan of the head yet.   His medications today are:  1. Aspirin 325 a day.  2. Nexium 40 mg a day.  3. Vytorin 10/40 a day.  4. Altace 2.5 daily.  5. Metoprolol 12.5 a day.  6. Multivitamin daily.  7. Calcium and D one daily.   His blood pressure is 122/72, his pulse is 53 and sinus brady.  His weight  is 201.  Carotids are equal bilaterally without bruits.  There is no JVD.  Thyroid is not enlarged.  Trachea is midline.  LUNGS:  Clear.  HEART:  Reveals a regular rate and rhythm.  ABDOMEN:  Exam is soft with good bowel sounds.  EXTREMITIES:  Reveal no clubbing, cyanosis or edema.  Pulses are intact.  He  became quite dizzy just changing positions in the room.  He had some  nystagmus.  His neuro exam otherwise is intact.   EKG shows sinus bradycardia with poor R-wave progression which is unchanged.   ASSESSMENT AND PLAN:  Mr. Fana coronary artery disease is stable.  His  blood work is being followed by Dr.  Margo Common.  I am concerned about the  persistent vertigo.  Though it may be benign positional, I think a CT of his  head with contrast is indicated.  He would like to have that done here.  I  will arrange and follow up on it myself.   I will plan on seeing him back in a year.                               Thomas C. Daleen Squibb, MD, [facility]    TCW/MedQ  DD:  05/22/2006  DT:  05/22/2006  Job #:  [id number]   cc:   Wyvonnia Lora

## 2011-02-02 NOTE — Op Note (Signed)
[place]  Patient:    [name] [name], [name] [name] Visit Number: [id number] MRN: [id number]          Service Type: DSU Location: [facility] Attending Physician:  Alinda Deem Dictated by:   Alinda Deem, M.D. Proc. Date: 06/02/01 Admit Date:  06/02/2001                             Operative Report  PREOPERATIVE DIAGNOSIS:  Recurrent left knee medial meniscal tear.  POSTOPERATIVE DIAGNOSES: 1. Recurrent left knee medial meniscal tear. 2. Chondromalacia, focal grade 4, to the medial femoral and medial tibial    condyle, global grade 3; focal grade 3 to the trochlea.  PROCEDURES: 1. Left knee partial arthroscopic medial meniscectomy. 2. Debridement of chondromalacia.  SURGEON:  Alinda Deem, M.D.  FIRST ASSISTANT:  Dorthula Matas, P.A.-C.  ANESTHESIA:  Local with IV sedation.  ESTIMATED BLOOD LOSS:  Minimal.  FLUID REPLACEMENT:  700 cc of crystalloid.  DRAINS:  None.  TOURNIQUET TIME:  None.  INDICATIONS FOR PROCEDURE:  A 65 year old man with symptomatic medial joint line pain of his left knee.  Previously he has had a partial medial meniscectomy and has had recurrent increasing pain over the last few months, failing conservative treatment.  He now desires elective redo arthroscopic evaluation and treatment of his left knee.  DESCRIPTION OF PROCEDURE:  The patient identified by arm band, taken to the operating room at [facility] day surgery center.  Appropriate anesthetic monitors were attached and local anesthesia with IV sedation was induced into the left lower extremity.  A lateral post was applied to the table and the left lower extremity prepped and draped in the usual sterile fashion from the ankle to the mid-thigh.  Utilizing the old inferomedial and inferolateral portals, incisions were made with a #11 blade, allowing introduction of the arthroscope through the inferolateral portal and the outflow through the  inferomedial portal.  Small bits of articular cartilage were noted to be floating in the joint fluid, and these were continuously taken through the outflow.  The patella had very mild grade 1 chondromalacia and was not addressed.  The trochlea had an area of grade 3 focal chondromalacia, which was smoothed with a 4.2 [employer].  Moving into the medial compartment, complex degenerative tearing of the medial meniscus was identified and debrided back to stable margins with a 4.2 mm [employer], as well as focal grade 4 chondromalacia to the far medial side of the medial femoral condyle, especially posteriorly, and the medial side of the tibial condyle.  Global grade 3 chondromalacia was also debrided to the medial compartment.   Moving into the notch, the ACL and PCL were intact on the lateral side.  The articular and meniscal cartilages were in good condition and required some very light incidental debridement.  The scope was taken through the gutters, which were cleared of small bits of articular cartilage, as well as into the posterior compartment going lateral to the PCL.  At this point the knee was washed out with normal saline solution, the arthroscopic instruments removed, and a dressing of Xeroform, 4 x 4 dressing sponges, Webril, and an Ace wrap applied.  The patient was then awakened and taken to the recovery room without difficulty. Dictated by:   Alinda Deem, M.D. Attending Physician:  Alinda Deem DD:  06/02/01 TD:  06/02/01 Job: [id number]

## 2011-02-02 NOTE — Procedures (Signed)
[facility]  Patient:    [name] [name], [name] [name]                     MRN: [id number] Proc. Date: 11/27/00 Adm. Date:  [id number] Attending:  Alinda Deem                           Procedure Report  PREOPERATIVE DIAGNOSIS:  Left knee medial meniscus tear.  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  OPERATION PERFORMED:  Left knee partial arthroscopic medial meniscectomy, debridement of chondromalacia of the trochlea, medial femoral condyle, medial tibial condyle.  SURGEON:  Alinda Deem, M.D.  ASSISTANT:  None.  ANESTHESIA:  General LMA.  ESTIMATED BLOOD LOSS:  Minimal.  FLUID REPLACEMENT:  500 cc crystalloid.  DRAINS:  None.  TOURNIQUET TIME:  INDICATIONS FOR PROCEDURE:  The patient is a 65 year old man with a clinical exam consistent with medial meniscus tear who has failed conservative treatment with anti-inflammatory medicines, physical therapy, exercises as well as observation.  Because of persistent pain, he desires arthroscopic evaluation of his left knee.  DESCRIPTION OF PROCEDURE:  The patient was identified by arm band and taken to the operating room at [facility] where the appropriate anesthetic monitors were attached and general LMA anesthesia induced with the patient in the supine position.  A lateral post was applied to the table and the left lower extremity prepped and draped in the usual sterile fashion from the ankle to the midthigh.  The skin along the inferomedial and inferolateral aspects of the peripatellar region was then infiltrated with 2 to 3 cc of 0.5% Marcaine with epinephrine solution and standard inferomedial and inferolateral peripatellar portals were then made with a #11 blade followed by introduction of the small trocar.  The arthroscope was then  inserted through the inferolateral portal, the outflow through the inferomedial portal and diagnostic arthroscopy undertaken.  We immediately identified the linear crack in the trochlea along the track of the patellar facet just to the lateral side of the trochlea which required debridement back to stable margins with a 3.5 mm gator sucker shaver.  Moving to the medial compartment, we identified a complex tear of the medial meniscus which was was removed with the straight biters and a 3.5 gator sucker shaver as well as chondromalacia globally of the medial femoral condyle and focally of the medial tibial condyle.  The ACL and the PCL were intact.  The lateral compartment was in excellent condition.  The gutters were cleared.  The scope was taken medial and lateral to the PCL and also cleared.  At this point the knee was washed out with normal saline solution.  The arthroscopic instruments removed.  A dressing of Xeroform, 4 x 8 dressing sponges, Webril and an Ace wrap applied.  The patient was awakened and taken to the recovery room without difficulty. DD:  11/27/00 TD:  11/27/00 Job: [id number]

## 2011-02-02 NOTE — Discharge Summary (Signed)
NAME:  [name] [name], [name] [name] NO.:  [id number]   MEDICAL RECORD NO.:  [id number]                   PATIENT TYPE:  INP   LOCATION:  2018                                 FACILITY:  [facility]   PHYSICIAN:  Veneda Melter, M.D.                   DATE OF BIRTH:  July 13, 1946   DATE OF ADMISSION:  08/07/2003  DATE OF DISCHARGE:  08/10/2003                           DISCHARGE SUMMARY - REFERRING   PROCEDURES:  Emergency coronary artery stenting, August 07, 2003.   REASON FOR ADMISSION:  Please refer to dictated admission note.   LABORATORY DATA:  Hemoglobin 11.3, hematocrit 34.1, platelets 264 at  discharge.  Sodium 144, potassium 4.2, BUN 14, creatinine 1.1, glucose 112  at discharge.  Cardiac enzymes:  Peak CPK 302/40.9 (15.5%);  troponin-I 2.68  on admission.  Lipid profile:  Total cholesterol 192, triglycerides 78, HDL  42, LDL 134 (cholesterol/HDL ratio of 4.6).   Admission chest x-ray:  No acute disease.   HOSPITAL COURSE:  The patient was taken directly from [facility] Emergency  Room to the cardiac catheterization lab for treatment of evolving anterior  wall myocardial infarction.   Procedure performed by Dr. Chales Abrahams (see report for full details) revealed  severe two vessel coronary artery disease notable for a 95% distal LAD, 80%  proximal AV/CFX, 80% proximal AV/CFX, 80% OM2, and 70% OM3 disease.  Left  ventricular function was preserved (greater than 55%).   Dr. Urban Gibson procedure was successful in stenting (Cypher) of the 95% distal  LAD lesion to 0% residual stenosis.  There were no noted complications.  Dr.  Chales Abrahams recommended stage PCI of the AV/CFX in four to six weeks.   Postoperative course was stable.  Medications were adjusted secondary to  development of hypotension.  ACE inhibitor was discontinued, and beta  blocker was down titrated by time of discharge.  Asymptomatic sinus  bradycardia was also noted.   The patient was cleared for discharge on  hospital day #3, hemodynamically  stable condition, with a discharge blood pressure of 106/91.  He was  maintaining normal sinus at 61 BPM.  The patient was referred to cardiac  rehab and expressed an interest to continue the outpatient program at  [facility].   DISCHARGE MEDICATIONS:  1. Lopressor 25 mg b.i.d.  2. Plavix 75 mg daily.  3. Coated aspirin 325 mg daily.  4. Lipitor 40 mg daily.  5. Nexium one tablet daily.  6. Nitrostat 0.4 mg p.r.n.   ACTIVITY:  No strenuous activity or return to work until seen by physician.  No driving for one week.   DIET:  Maintain low fat, low cholesterol diet.   Call the office if there is any swelling/bleeding of the groin.   FOLLOWUP:  The patient is scheduled to follow up with Dr. Chales Abrahams on Tuesday,  September 01, 2003, at 2:15 p.m.   DISCHARGE DIAGNOSES:  1. Status post anterior  myocardial infarction.     a. Emergent stent (Cypher) 95% distal left anterior descending.     b. Residual 80% AV/circumflex.  Stage percutaneous intervention in four        to six weeks.     c. Preserved left ventricular function.  2. Hypotension.  3. Sinus bradycardia.  4. Dyslipidemia.  5. Gastroesophageal reflux disease.  6. History of tobacco.      Gene Serpe, P.A. [facility]                      Veneda Melter, M.D.    GS/MEDQ  D:  08/10/2003  T:  08/10/2003  Job:  [id number]   cc:   Wyvonnia Lora  [address]  [place]  [place] [address]  Fax: [phone]

## 2011-02-02 NOTE — Op Note (Signed)
. [facility]  Patient:    [name] [name], [name] [name] Visit Number: [id number] MRN: [id number]          Service Type: SUR Location: [id number] Attending Physician:  Alinda Deem Dictated by:   Alinda Deem, M.D. Proc. Date: 01/12/02 Admit Date:  01/12/2002                             Operative Report  PREOPERATIVE DIAGNOSIS:  Degenerative arthritis of the left knee.  POSTOPERATIVE DIAGNOSIS:  Degenerative arthritis of the left knee.  OPERATION PERFORMED:  Left total knee arthroplasty using Osteonic Scorpio components, a #9 left femur, 11 tibial base plate, 10 mm Scorpio spacer, a #9 modified medial patellar button, all components cemented double batch of Palacos cement with Zinacef.  SURGEON:  Alinda Deem, M.D.  ASSISTANT: 1. Harvie Junior, M.D. 2. Dorthula Matas, P.A.-C.  ANESTHESIA:  General endotracheal.  ESTIMATED BLOOD LOSS:  Minimal.  FLUID REPLACEMENT:  800 cc of crystalloid.  DRAINS:  None.  TOURNIQUET TIME:  One hour and 25 minutes.  INDICATIONS FOR PROCEDURE:  The patient is a 65 year old gentleman with end-stage arthritis of the left knee who has failed conservative management with anti-inflammatory medicine, physical therapy.  He has had a couple of knee arthroscopies and continues to have significant pain, swelling and effusion in his left knee after well over a year.  He has also failed cortisone injection.  The last time we scoped him he had bare bone arthritis of the medial compartment of his knee.  At age 65 he is a candidate for a total knee arthroplasty because of pain and lost function.  DESCRIPTION OF PROCEDURE:  The patient was identified by arm band and taken to the operating room at [facility] main hospital where the appropriate anesthetic monitors were attached and general endotracheal anesthesia induced with the patient in the supine position.  A lateral post was applied to the table as well as  a foot holder to the left side of the table and the left lower extremity had a tourniquet applied high to the left thigh.  He was then prepped and draped in the usual sterile fashion from the ankle to the tourniquet.  The limb was wrapped with an Esmarch bandage.  The tourniquet inflated to 350 mHg.  Anterior midline incision 18 cm in length centered over the patella was made, allowing a medial parapatellar incision into the knee joint.  Small bleeders were identified and cauterized with the electrocautery. The prepatellar fat pad was resected as well as the anterior one half of the menisci.  The patella was everted, the knee was hyperflexed exposing the cruciate ligaments which were then resected as well as the posterior horns of the menisci.  We elevated the superficial medial collateral ligament from anterior to posterior exposing the end stage arthritic bone-on-bone changes in the medial compartment.  The tibial spines were then removed with a power saw and the proximal tibia instrumented with the Osteonic step drill followed by the IM rod for a 0 degree posterior slope cutting guide which was then pinned into place allowing resection of 3 to 4 mm of bone medially and about 6 to 7 mm of bone laterally.  The patient was tight on the medial side consistent with a varus deformity of his knee.  We then entered the distal femur 2 mm anterior to the PCL origin with the step drill  followed by the 5 degree left cutting guide for a 10 mm distal femoral cut.  It was pinned into place and the distal femoral cut accomplished.  We then sized for a #9 left femoral component which the cutting guide was pinned into place in three degrees of external rotation because of the varus deformity.  We then performed our anterior, posterior and chamfer cuts without difficulty followed by the Scorpio box cutting guide and cuts.  The everted patella was then grasped with the patellar cutting guide.  The posterior  10 mm of the patella were resected without difficulty, sized for a #9 modified medial patellar button and drilled.  We then inserted a #9 left trial femoral component, an 11 tibial base plate with a 10 trial spacer.  The knee had excellent flexion, full extension and good varus and valgus stability.  The knee was then hyperflexed, the tibia translated anteriorly with a McHale and Hohmann retractors and the tibial base plate for an 11 was pinned into place and the Delta fit keel punch was performed without difficulty.  All bony surfaces were then waterpicked clean.  A double batch of Palacos polymethyl methacrylate cement was mixed with 1500 mg of Zinacef and applied to all bony metallic surfaces and in order, we hammered into place, the #11 tibial base plate.  Excess cement was removed.  The #9 left femoral Scorpio component.  Excess cement was removed and a #9 modified medial patellar button.  A 10 Scorpio spacer was then hammered into the tibial base plate and the knee reduced and held in about 5 degrees of flexion with compression applied to the foot.  The wound was then waterpicked clean.  We checked for patellar tracking, which was excellent. Medium Hemovac drains were placed deep in the wound.  The parapatellar arthrotomy was closed with running #1 Vicryl suture, the subcutaneous tissue with 2-0 undyed Vicryl sutures and the skin with skin staples. A dressing of Xeroform, 4 x 4 dressing sponges, Webril and an Ace wrap applied.  The patient was awakened and taken to the recovery room without difficulty. Dictated by:   Alinda Deem, M.D. Attending Physician:  Alinda Deem DD:  01/12/02 TD:  01/12/02 Job: [id number]

## 2011-02-02 NOTE — Cardiovascular Report (Signed)
NAME:  [name] [name], [name] [name] NO.:  [id number]   MEDICAL RECORD NO.:  [id number]                   PATIENT TYPE:  INP   LOCATION:  2927                                 FACILITY:  [facility]   PHYSICIAN:  Veneda Melter, M.D.                   DATE OF BIRTH:  18-Dec-1945   DATE OF PROCEDURE:  08/07/2003  DATE OF DISCHARGE:                              CARDIAC CATHETERIZATION   PROCEDURES PERFORMED:  1. Left heart catheterization.  2. Left ventriculogram.  3. Selective coronary angiography.  4. PTCA and stent placement distal left anterior descending.  5. AngioSeal right femoral artery.   DIAGNOSES:  1. Two vessel coronary artery disease.  2. Normal left ventricular systolic function.  3. Non ST elevation anterior myocardial infarction.   HISTORY:  [name] is a 65 year old gentleman without prior cardiac  history, but with a strong family history of coronary disease and tobacco  use who presents with substernal chest discomfort.  The patient has had  worsening pain since approximately 3 p.m.  Finally presented to [facility]  Emergency Room at 6 p.m.  He subsequently stabilized medically.  However, he  had recurrence of pain and ECGs showed evolving anterior wall myocardial  infarction and he had elevated cardiac enzymes.  He was referred for  catheterization.   TECHNIQUE:  Informed consent was obtained.  The patient was brought to the  catheterization laboratory.  A 6-French sheath placed in the right femoral  artery using modified Seldinger technique.  A 6-French JL4 and JR4 catheter  was then used to engage the left and right coronary arteries.  Selective  angiography performed in various projections using manual injections of  contrast.  A 6-French pigtail catheter was then advanced in the left  ventricle and left ventriculogram performed using power injections of  contrast.   FINDINGS:  1. Left main trunk:  Large caliber vessel with mild irregularities.  2.  LAD:  This begins as a large caliber vessel and tapers in the distal     section.  Provides two diagonal branches.  The proximal LAD has mild     disease of 30%.  The mid LAD has eccentric plaque of 50%.  The distal LAD     has a high grade narrowing of 95% medially after the second diagonal     branch with TIMI 2 flow distally.  The first diagonal branch has an     ostial narrowing of 50-60%.  The second diagonal branch has an ostial     narrowing of 30-40% and is a large caliber vessel.  3. Left circumflex artery:  This is a medium caliber vessel.  Provides     trivial first marginal branch in the mid section and two marginal     branches distally.  The proximal AV circumflex has high grade narrowing     of 70-80%.  There is then further narrowing of  50-60% after the first     marginal branch.  The first marginal branch has long tubular narrowing of     50% in the proximal segment.  The second marginal branch has an ostial     narrowing of 80%.  The third marginal branch has proximal narrowing of     70%.  4. Right coronary artery:  Dominant.  This is a large caliber vessel.     Provides the posterior descending artery and posterior ventricular branch     in terminal segment.  The right coronary artery has diffuse disease of 30-     40% in the mid and distal section.  5. LV:  Normal end-systolic and end-diastolic dimensions.  Overall left     ventricular function is well preserved.  Ejection fraction greater than     55%.  No mitral regurgitation.  There is akinesis of a small portion of     the mid anterior wall.  LV pressure is 110/10.  Aortic is 110/60.  LVEDP     equals 15.   DESCRIPTION OF PROCEDURE:  With these findings we elected to proceed with  percutaneous intervention to the distal LAD.  The patient was given heparin  systemically as well as Reopro to maintain an ACT of approximately 300  seconds using 600 mg of Plavix.  During the case he developed some itching  and was  given Benadryl and Solu-Medrol with improvement of this.  A 6-French  Q4 guide catheter was used to engage the left coronary artery and a 0.014  inch Asahi Prowater wire introduced.  This was used to cross the stenosis  and position the distal LAD.  A 2.5 x 15 mm Voyager balloon was introduced  and used to dilate the distal LAD lesion at 6 atmospheres for 120 seconds.  Repeat angiography showed an improvement in vessel lumen.  However, there  was severe residual plaque and it was felt that further intervention would  be required.  We introduced a 2.75 x 15 mm cutting balloon and a total of  six inflations were performed within the distal LAD at up to 10 atmospheres  for 60 seconds.  Repeat angiography showed modest improvement in vessel  lumen.  However, in the LAO cranial views there still appeared to be  residual narrowing of 70-80% at the origin of the stenosis.  Some plaque  shifting had occurred into the diagonal branch as well.  While we tried to  avoid stent placement, this became unavoidable.  We positioned a 2.5 x 18 mm  Cypher stent into the distal LAD and deployed this at 10 atmospheres for 30  seconds.  A 2.75 x 15 mm Quantum Maverick balloon was then used to post  dilate the stent.  Two inflations were performed up to 14 atmospheres for 30  seconds each.  Repeat angiography was then performed after the  administration of intracoronary nitroglycerin showing excellent result with  no residual stenosis in the distal LAD and improvement of TIMI grade 2 to  TIMI grade 3 flow in the distal LAD.  There was some plaque shift to the  origin of the second diagonal branch of 70-80%.  However, there was still  TIMI 3 flow in this vessel.  We decided to accept this result.  Final  angiography was performed in various projections showing no distal vessel  damage.  The guide catheter was then removed.  An AngioSeal closure device was then deployed to the right femoral artery due  to mild oozing  around the  sheath.  There was persistence of oozing and a FemStop was positioned until  hemostasis was achieved.  The patient then transferred to the CCU in stable  condition.  He tolerated the procedure well.   FINAL RESULTS:  Successful PTCA and stent placement in the distal LAD with  reduction of 95% narrowing to 0% with placement of a 2.5 x 18 mm Cypher drug-  eluting stent dilated to 2.75 mm and improvement of TIMI grade 2 to TIMI  grade 3 flow.   ASSESSMENT/PLAN:  Mr. Kushnir is a 65 year old gentleman with advanced two  vessel coronary artery disease.  He will be considered for staged  intervention in the proximal AV circumflex in four to six weeks' time.  He  will be placed on Plavix for a minimum of six months and aggressive risk  factor modification will be pursued.                                               Veneda Melter, M.D.    NG/MEDQ  D:  08/08/2003  T:  08/08/2003  Job:  [id number]   cc:   Wyvonnia Lora  [address]  [place]  [place] [address]  Fax: [phone]

## 2011-07-19 ENCOUNTER — Telehealth: Payer: Self-pay | Admitting: Cardiology

## 2011-07-19 NOTE — Telephone Encounter (Signed)
Pt needs rx called in to [facility] in [place] for losartan 50 mg , metoprolol 25mg  and simvastatin 40mg . Please call in for 90 day good for a year.   he has a refill left for simvastatin 40mg  but would like new called in so all refills will match up same time to be filled.

## 2011-07-20 MED ORDER — SIMVASTATIN 40 MG PO TABS: 40.0000 mg | ORAL_TABLET | Freq: Every day | ORAL | Status: DC

## 2011-07-20 MED ORDER — LOSARTAN POTASSIUM 50 MG PO TABS: 50.0000 mg | ORAL_TABLET | Freq: Every day | ORAL | Status: DC

## 2011-07-20 MED ORDER — METOPROLOL TARTRATE 25 MG PO TABS: 12.5000 mg | ORAL_TABLET | Freq: Two times a day (BID) | ORAL | Status: DC

## 2011-07-31 ENCOUNTER — Ambulatory Visit (INDEPENDENT_AMBULATORY_CARE_PROVIDER_SITE_OTHER): Payer: Medicare Other | Admitting: Cardiology

## 2011-07-31 ENCOUNTER — Encounter: Payer: Self-pay | Admitting: Cardiology

## 2011-07-31 VITALS — BP 134/72 | HR 69 | Resp 16 | Ht 71.0 in | Wt 204.0 lb

## 2011-07-31 DIAGNOSIS — I2510 Atherosclerotic heart disease of native coronary artery without angina pectoris: Secondary | ICD-10-CM

## 2011-07-31 NOTE — Assessment & Plan Note (Signed)
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 His last chest x-ray showed COPD which he and his wife are very surprised about. He is not having any symptoms. His stress Myoview a little over a year ago. No change in medications and I will follow him in a year.

## 2011-07-31 NOTE — Patient Instructions (Signed)
Your physician recommends that you continue on your current medications as directed. Please refer to the Current Medication list given to you today.  Your physician recommends that you schedule a follow-up appointment in: 1 year  

## 2011-07-31 NOTE — Progress Notes (Signed)
HPI [name] [name] returns today for evaluation and management his coronary artery disease. He had an acute anterior [name] [name] myocardial infarction in 2004 with emergent PCI. He had full recovery of his myocardium. His last ejection fraction was at 60% or more. He also had a circumflex PCI subsequent to this.  Other than some dyspnea on exertion, is having no symptoms of angina or ischemia. He is a 40-pack-year smoking history and  he was a IT sales professional. He was told recently that he had COPD by primary care.  Denies orthopnea, PND, peripheral edema, palpitations, presyncope No past medical history on file.  Current Outpatient Prescriptions  Medication Sig Dispense Refill  . aspirin 325 MG tablet Take 325 mg by mouth daily.        Marland Kitchen losartan (COZAAR) 50 MG tablet Take 1 tablet (50 mg total) by mouth daily.  90 tablet  0  . metoprolol tartrate (LOPRESSOR) 25 MG tablet Take 0.5 tablets (12.5 mg total) by mouth 2 (two) times daily.  90 tablet  0  . Multiple Vitamin (MULTIVITAMIN PO) Take by mouth daily.        Marland Kitchen omeprazole (PRILOSEC) 20 MG capsule Take 40 mg by mouth daily.        . simvastatin (ZOCOR) 40 MG tablet Take 1 tablet (40 mg total) by mouth at bedtime.  90 tablet  0    Allergies  Allergen Reactions  . Codeine     No family history on file.  History   Social History  . Marital Status: Married    Spouse Name: N/A    Number of Children: N/A  . Years of Education: N/A   Occupational History  . Not on file.   Social History Main Topics  . Smoking status: Former Games developer  . Smokeless tobacco: Not on file  . Alcohol Use: Not on file  . Drug Use: Not on file  . Sexually Active: Not on file   Other Topics Concern  . Not on file   Social History Narrative  . No narrative on file    ROS ALL NEGATIVE EXCEPT THOSE NOTED IN HPI  PE  General Appearance: well developed, well nourished in no acute distress, overweight HEENT: symmetrical face, PERRLA, good dentition  Neck: no JVD,  thyromegaly, or adenopathy, trachea midline Chest: symmetric without deformity Cardiac: PMI non-displaced, RRR, normal S1, S2, no gallop or murmur Lung: clear to ausculation and percussion Vascular: all pulses full without bruits  Abdominal: nondistended, nontender, good bowel sounds, no HSM, no bruits Extremities: no cyanosis, clubbing or edema, no sign of DVT, no varicosities  Skin: normal color, no rashes Neuro: alert and oriented x 3, non-focal Pysch: normal affect  EKG Normal sinus rhythm with some PACs, otherwise normal EKG. BMET No results found for this basename: na, k, cl, co2, glucose, bun, creatinine, calcium, gfrnonaa, gfraa    Lipid Panel  No results found for this basename: chol, trig, hdl, cholhdl, vldl, ldlcalc    CBC No results found for this basename: wbc, rbc, hgb, hct, plt, mcv, mch, mchc, rdw, neutrabs, lymphsabs, monoabs, eosabs, basosabs

## 2011-08-27 ENCOUNTER — Emergency Department (HOSPITAL_COMMUNITY): Payer: Medicare Other

## 2011-08-27 ENCOUNTER — Inpatient Hospital Stay (HOSPITAL_COMMUNITY)
Admission: EM | Admit: 2011-08-27 | Discharge: 2011-08-30 | DRG: 287 | Disposition: A | Discharge status: Home / Self Care | Payer: Medicare Other | Attending: Cardiovascular Disease | Admitting: Cardiovascular Disease

## 2011-08-27 ENCOUNTER — Encounter (HOSPITAL_COMMUNITY)
Admission: EM | Disposition: A | Discharge status: Home / Self Care | Payer: Self-pay | Source: Home / Self Care | Attending: Cardiovascular Disease

## 2011-08-27 ENCOUNTER — Encounter (HOSPITAL_COMMUNITY): Payer: Self-pay

## 2011-08-27 ENCOUNTER — Other Ambulatory Visit: Payer: Self-pay

## 2011-08-27 DIAGNOSIS — R079 Chest pain, unspecified: Secondary | ICD-10-CM

## 2011-08-27 DIAGNOSIS — Z96659 Presence of unspecified artificial knee joint: Secondary | ICD-10-CM

## 2011-08-27 DIAGNOSIS — I252 Old myocardial infarction: Secondary | ICD-10-CM

## 2011-08-27 DIAGNOSIS — Z79899 Other long term (current) drug therapy: Secondary | ICD-10-CM

## 2011-08-27 DIAGNOSIS — E785 Hyperlipidemia, unspecified: Secondary | ICD-10-CM | POA: Diagnosis present

## 2011-08-27 DIAGNOSIS — Z7982 Long term (current) use of aspirin: Secondary | ICD-10-CM

## 2011-08-27 DIAGNOSIS — R0789 Other chest pain: Principal | ICD-10-CM

## 2011-08-27 DIAGNOSIS — K219 Gastro-esophageal reflux disease without esophagitis: Secondary | ICD-10-CM | POA: Insufficient documentation

## 2011-08-27 DIAGNOSIS — I2510 Atherosclerotic heart disease of native coronary artery without angina pectoris: Secondary | ICD-10-CM | POA: Insufficient documentation

## 2011-08-27 DIAGNOSIS — F17200 Nicotine dependence, unspecified, uncomplicated: Secondary | ICD-10-CM | POA: Diagnosis present

## 2011-08-27 DIAGNOSIS — Z9861 Coronary angioplasty status: Secondary | ICD-10-CM

## 2011-08-27 DIAGNOSIS — M1990 Unspecified osteoarthritis, unspecified site: Secondary | ICD-10-CM | POA: Diagnosis present

## 2011-08-27 HISTORY — DX: Acute myocardial infarction, unspecified: I21.9

## 2011-08-27 HISTORY — PX: LEFT HEART CATHETERIZATION WITH CORONARY ANGIOGRAM: SHX5451

## 2011-08-27 LAB — COMPREHENSIVE METABOLIC PANEL
ALT: 23 U/L (ref 0–53)
AST: 22 U/L (ref 0–37)
Albumin: 4 g/dL (ref 3.5–5.2)
CO2: 26 mEq/L (ref 19–32)
Calcium: 9.8 mg/dL (ref 8.4–10.5)
GFR calc non Af Amer: 83 mL/min — ABNORMAL LOW (ref >90)
Sodium: 139 mEq/L (ref 135–145)

## 2011-08-27 LAB — POCT ACTIVATED CLOTTING TIME: Activated Clotting Time: 155 seconds

## 2011-08-27 LAB — CBC
Hemoglobin: 13.7 g/dL (ref 13.0–17.0)
MCH: 29.4 pg (ref 26.0–34.0)
MCV: 87.6 fL (ref 78.0–100.0)
Platelets: 212 10*3/uL (ref 150–400)
RBC: 4.66 MIL/uL (ref 4.22–5.81)
RBC: 4.39 MIL/uL (ref 4.22–5.81)
WBC: 7.1 10*3/uL (ref 4.0–10.5)
WBC: 9.6 10*3/uL (ref 4.0–10.5)

## 2011-08-27 LAB — HEMOGLOBIN A1C
Hgb A1c MFr Bld: 6.1 % — ABNORMAL HIGH (ref <5.7)
Mean Plasma Glucose: 128 mg/dL — ABNORMAL HIGH (ref <117)

## 2011-08-27 LAB — CARDIAC PANEL(CRET KIN+CKTOT+MB+TROPI)
CK, MB: 2.5 ng/mL (ref 0.3–4.0)
Relative Index: INVALID (ref 0.0–2.5)
Relative Index: INVALID (ref 0.0–2.5)
Total CK: 74 U/L (ref 7–232)
Troponin I: <0.3 ng/mL (ref <0.30)
Troponin I: <0.3 ng/mL (ref <0.30)

## 2011-08-27 LAB — CREATININE, SERUM
Creatinine, Ser: 0.88 mg/dL (ref 0.50–1.35)
GFR calc Af Amer: >90 mL/min (ref >90)

## 2011-08-27 LAB — MRSA PCR SCREENING: MRSA by PCR: NEGATIVE

## 2011-08-27 LAB — D-DIMER, QUANTITATIVE: D-Dimer, Quant: 0.28 ug/mL-FEU (ref 0.00–0.48)

## 2011-08-27 LAB — MAGNESIUM: Magnesium: 2 mg/dL (ref 1.5–2.5)

## 2011-08-27 SURGERY — LEFT HEART CATHETERIZATION WITH CORONARY ANGIOGRAM
Anesthesia: LOCAL

## 2011-08-27 MED ORDER — MIDAZOLAM HCL 2 MG/2ML IJ SOLN
INTRAMUSCULAR | Status: AC
  Filled 2011-08-27: qty 2

## 2011-08-27 MED ORDER — NITROGLYCERIN 0.2 MG/ML ON CALL CATH LAB
INTRAVENOUS | Status: AC
  Filled 2011-08-27: qty 1

## 2011-08-27 MED ORDER — ACETAMINOPHEN 325 MG PO TABS
650.0000 mg | ORAL_TABLET | ORAL | Status: DC | PRN
  Filled 2011-08-27: qty 2

## 2011-08-27 MED ORDER — NITROGLYCERIN IN D5W 200-5 MCG/ML-% IV SOLN: 5.0000 ug/min | Freq: Once | INTRAVENOUS | Status: DC

## 2011-08-27 MED ORDER — PANTOPRAZOLE SODIUM 40 MG PO TBEC
80.0000 mg | DELAYED_RELEASE_TABLET | Freq: Every day | ORAL | Status: DC
  Administered 2011-08-27 – 2011-08-29 (×3): 80 mg via ORAL
  Filled 2011-08-27 (×2): qty 2
  Filled 2011-08-27: qty 1

## 2011-08-27 MED ORDER — HEPARIN SOD (PORCINE) IN D5W 100 UNIT/ML IV SOLN
16.0000 [IU]/kg/h | Freq: Once | INTRAVENOUS | Status: AC
  Administered 2011-08-27: 1450 [IU]/h via INTRAVENOUS

## 2011-08-27 MED ORDER — LIDOCAINE HCL (PF) 1 % IJ SOLN
INTRAMUSCULAR | Status: AC
  Filled 2011-08-27: qty 30

## 2011-08-27 MED ORDER — SIMVASTATIN 40 MG PO TABS
40.0000 mg | ORAL_TABLET | Freq: Every day | ORAL | Status: DC
  Administered 2011-08-27 – 2011-08-29 (×3): 40 mg via ORAL
  Filled 2011-08-27 (×4): qty 1

## 2011-08-27 MED ORDER — METOPROLOL TARTRATE 12.5 MG HALF TABLET
12.5000 mg | ORAL_TABLET | Freq: Two times a day (BID) | ORAL | Status: DC
  Administered 2011-08-27 – 2011-08-29 (×5): 12.5 mg via ORAL
  Filled 2011-08-27 (×7): qty 1

## 2011-08-27 MED ORDER — HEPARIN SOD (PORCINE) IN D5W 100 UNIT/ML IV SOLN: 16.0000 [IU]/kg/h | INTRAVENOUS | Status: DC

## 2011-08-27 MED ORDER — NITROGLYCERIN IN D5W 200-5 MCG/ML-% IV SOLN
INTRAVENOUS | Status: AC
  Administered 2011-08-27: 5 ug/min
  Filled 2011-08-27: qty 250

## 2011-08-27 MED ORDER — ZOLPIDEM TARTRATE 5 MG PO TABS: 5.0000 mg | ORAL_TABLET | Freq: Every evening | ORAL | Status: DC | PRN

## 2011-08-27 MED ORDER — SODIUM CHLORIDE 0.9 % IV SOLN: 1.0000 mL/kg/h | INTRAVENOUS | Status: DC

## 2011-08-27 MED ORDER — NITROGLYCERIN IN D5W 200-5 MCG/ML-% IV SOLN
10.0000 ug/min | INTRAVENOUS | Status: DC
  Administered 2011-08-27: 10 ug/min via INTRAVENOUS

## 2011-08-27 MED ORDER — MORPHINE SULFATE 4 MG/ML IJ SOLN
4.0000 mg | Freq: Once | INTRAMUSCULAR | Status: AC
  Administered 2011-08-27: 4 mg via INTRAVENOUS

## 2011-08-27 MED ORDER — MORPHINE SULFATE 4 MG/ML IJ SOLN
INTRAMUSCULAR | Status: AC
  Administered 2011-08-27: 4 mg
  Filled 2011-08-27: qty 1

## 2011-08-27 MED ORDER — ENOXAPARIN SODIUM 40 MG/0.4ML ~~LOC~~ SOLN
40.0000 mg | SUBCUTANEOUS | Status: DC
  Filled 2011-08-27: qty 0.4

## 2011-08-27 MED ORDER — ONDANSETRON HCL 4 MG/2ML IJ SOLN: 4.0000 mg | Freq: Four times a day (QID) | INTRAMUSCULAR | Status: DC | PRN

## 2011-08-27 MED ORDER — LOSARTAN POTASSIUM 50 MG PO TABS
50.0000 mg | ORAL_TABLET | Freq: Every day | ORAL | Status: DC
  Administered 2011-08-27 – 2011-08-30 (×4): 50 mg via ORAL
  Filled 2011-08-27 (×4): qty 1

## 2011-08-27 MED ORDER — HEPARIN (PORCINE) IN NACL 2-0.9 UNIT/ML-% IJ SOLN
INTRAMUSCULAR | Status: AC
  Filled 2011-08-27: qty 2000

## 2011-08-27 MED ORDER — ASPIRIN EC 81 MG PO TBEC: 81.0000 mg | DELAYED_RELEASE_TABLET | Freq: Every day | ORAL | Status: DC

## 2011-08-27 MED ORDER — HEPARIN SOD (PORCINE) IN D5W 100 UNIT/ML IV SOLN
4000.0000 [IU]/h | Freq: Once | INTRAVENOUS | Status: AC
  Administered 2011-08-27: 4000 [IU]/h via INTRAVENOUS

## 2011-08-27 MED ORDER — SODIUM CHLORIDE 0.9 % IJ SOLN
3.0000 mL | Freq: Two times a day (BID) | INTRAMUSCULAR | Status: DC
  Administered 2011-08-27 – 2011-08-29 (×4): 3 mL via INTRAVENOUS

## 2011-08-27 MED ORDER — ASPIRIN 81 MG PO CHEW: 324.0000 mg | CHEWABLE_TABLET | ORAL | Status: DC

## 2011-08-27 MED ORDER — ALPRAZOLAM 0.25 MG PO TABS
0.2500 mg | ORAL_TABLET | Freq: Two times a day (BID) | ORAL | Status: DC | PRN
  Administered 2011-08-27 – 2011-08-28 (×2): 0.25 mg via ORAL
  Filled 2011-08-27 (×2): qty 1

## 2011-08-27 MED ORDER — THERA M PLUS PO TABS
1.0000 | ORAL_TABLET | Freq: Every day | ORAL | Status: DC
  Administered 2011-08-28 – 2011-08-30 (×3): 1 via ORAL
  Filled 2011-08-27 (×3): qty 1

## 2011-08-27 MED ORDER — MORPHINE SULFATE 4 MG/ML IJ SOLN
INTRAMUSCULAR | Status: AC
  Administered 2011-08-27: 4 mg via INTRAVENOUS
  Filled 2011-08-27: qty 1

## 2011-08-27 MED ORDER — SODIUM CHLORIDE 0.9 % IV SOLN: 250.0000 mL | INTRAVENOUS | Status: DC | PRN

## 2011-08-27 MED ORDER — SODIUM CHLORIDE 0.9 % IJ SOLN: 3.0000 mL | INTRAMUSCULAR | Status: DC | PRN

## 2011-08-27 MED ORDER — SODIUM CHLORIDE 0.9 % IJ SOLN: 3.0000 mL | Freq: Two times a day (BID) | INTRAMUSCULAR | Status: DC

## 2011-08-27 MED ORDER — HEPARIN SOD (PORCINE) IN D5W 100 UNIT/ML IV SOLN
INTRAVENOUS | Status: AC
  Administered 2011-08-27: 1450 [IU]/h via INTRAVENOUS
  Filled 2011-08-27: qty 250

## 2011-08-27 MED ORDER — ASPIRIN 81 MG PO CHEW
81.0000 mg | CHEWABLE_TABLET | Freq: Every day | ORAL | Status: DC
  Administered 2011-08-27 – 2011-08-30 (×4): 81 mg via ORAL
  Filled 2011-08-27 (×4): qty 1

## 2011-08-27 MED ORDER — DIAZEPAM 5 MG PO TABS: 5.0000 mg | ORAL_TABLET | ORAL | Status: DC

## 2011-08-27 MED ORDER — MORPHINE SULFATE 2 MG/ML IJ SOLN
2.0000 mg | INTRAMUSCULAR | Status: DC | PRN
  Administered 2011-08-27 – 2011-08-28 (×3): 2 mg via INTRAVENOUS
  Filled 2011-08-27 (×3): qty 1

## 2011-08-27 MED ORDER — FENTANYL CITRATE 0.05 MG/ML IJ SOLN
INTRAMUSCULAR | Status: AC
  Filled 2011-08-27: qty 2

## 2011-08-27 MED ORDER — NITROGLYCERIN 0.4 MG SL SUBL
0.4000 mg | SUBLINGUAL_TABLET | SUBLINGUAL | Status: DC | PRN
  Administered 2011-08-28: 0.4 mg via SUBLINGUAL
  Filled 2011-08-27: qty 25

## 2011-08-27 NOTE — ED Notes (Signed)
Pt started with cp last pm and progressively got worse today with pressure and sob. Hx of MI with 3 stent placement in 2004.

## 2011-08-27 NOTE — ED Notes (Signed)
Pt states pain started last night as tightness, pain intensified this morning as stabbing pain. Pt reports have an MI in 2004. Labs drawn, PIV started and portable chest xray completed.  Dr Estell Harpin notified with EKG.

## 2011-08-27 NOTE — ED Notes (Signed)
Checked on bed request. Per Shaun at Bed Placement , there are no stepdown beds at this time.  Pts nurse informed.

## 2011-08-27 NOTE — Progress Notes (Signed)
ANTICOAGULATION CONSULT NOTE - Initial Consult  Pharmacy Consult for heparin Indication: chest pain/ACS  Allergies  Allergen Reactions  . Codeine     Patient Measurements: Height: 5\' 10"  (177.8 cm) Weight: 200 lb (90.719 kg) IBW/kg (Calculated) : 73  Adjusted Body Weight:78.3 Heparin dosing weight = 90.7kg  Vital Signs: Temp: 97.8 F (36.6 C) (12/10 1108) Temp src: Oral (12/10 1108) BP: 142/79 mmHg (12/10 1336) Pulse Rate: 56  (12/10 1336)  Labs:  Basename 08/27/11 1105  HGB 13.7  HCT 40.8  PLT 239  APTT --  LABPROT --  INR --  HEPARINUNFRC --  CREATININE 0.98  CKTOTAL 74  CKMB 2.7  TROPONINI <0.30   Estimated Creatinine Clearance: 82.9 ml/min (by C-G formula based on Cr of 0.98).  Medical History: Past Medical History  Diagnosis Date  . Myocardial infarct     Medications:  Prescriptions prior to admission  Medication Sig Dispense Refill  . aspirin EC 325 MG tablet Take 325 mg by mouth daily.        Marland Kitchen losartan (COZAAR) 50 MG tablet Take 1 tablet (50 mg total) by mouth daily.  90 tablet  0  . metoprolol tartrate (LOPRESSOR) 25 MG tablet Take 12.5 mg by mouth 2 (two) times daily.        . Multiple Vitamin (MULTIVITAMIN PO) Take by mouth daily.        Marland Kitchen omeprazole (PRILOSEC) 40 MG capsule Take 40 mg by mouth daily.        . simvastatin (ZOCOR) 40 MG tablet Take 1 tablet (40 mg total) by mouth at bedtime.  90 tablet  0  . nitroGLYCERIN (NITROSTAT) 0.4 MG SL tablet Place 0.4 mg under the tongue every 5 (five) minutes as needed. Chest pain        Assessment: 65 yo man w/ hx MI and stents x 3 in 2004 started on heparin in the ED by EDP.  He was given heparin 4000 unit bolus and heparin 16 units/kg/hr = 1450 units/hr at 1215.  No  Bleeding reported. Goal of Therapy:  Heparin level 0.3-0.7 units/ml   Plan:  Continue heparin at current rate of 1450 units/hr and check heparin level 8 hrs after bolus at 2000 tonight.  Len Childs T 08/27/2011,4:52 PM

## 2011-08-27 NOTE — ED Notes (Signed)
Pt states pain in chest is getting worse, " tightness/squeezing".

## 2011-08-27 NOTE — ED Notes (Signed)
[facility] here to transport Pt to [facility].  Report given

## 2011-08-27 NOTE — Op Note (Signed)
Cardiac Catheterization Procedure Note  Name: ALEXIS MIZUNO MRN: [id number] DOB: 1946-01-14  Procedure: Left Heart Cath, Selective Coronary Angiography, LV angiography  Indication: Chest pain and dyspnea   Procedural details: The right groin was prepped, draped, and anesthetized with 1% lidocaine. Using modified Seldinger technique, a 5 French sheath was introduced into the right femoral artery. Standard Judkins catheters were used for coronary angiography and left ventriculography. Catheter exchanges were performed over a guidewire. There were no immediate procedural complications. The patient was transferred to the post catheterization recovery area for further monitoring.  Procedural Findings: Hemodynamics:  AO 146/68 (94) LV 150/10/24   Coronary angiography: Coronary dominance: right  Left mainstem: No significant obstruction  Left anterior descending (LAD): Courses to the apex and has one major diagonal branch.  Prior to the diagonal takeoff there is an area of hypodensity in the LAO cranial view, with some narrowing, also seen on the 2008 study.  Multiple views of this area were obtained, including LAO caudal and RAO cranial.  RAO, and RAO caudal also did not suggest high grade obstruction.  As on the previous study, there was diagonal narrowing of probably 70-75% at the ostium, but unchanged.  The stent had about 30% narrowing, unchanged from 2008,  The distal LAD is without critical narrowing.    Left circumflex (LCx): The circumflex provides a small OM 1 without narrowing.  The stent beyond this is widely patent  (placed 2004).   Just after OM 2 is an area of narrowing of 50-60%, perhaps more prominent on the current study as the distal vessel is more widely dilated.  The OM3 has ostial 75%, previously noted as well.    Right coronary artery (RCA): The RCA is a large caliber, dominant vessel.  There is 20-30 mid eccentric plaque, and a distal 30-40% plaque, with mild PDA disease.     Left ventriculography: Left ventricular systolic function is normal, LVEF is estimated at 55-65%, there is no significant mitral regurgitation.   Final Conclusions:   1.  Continued patency of LAD and CFX stents. 2.  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 3.  Diffuse plaque proximal LAD and CFX after stent site as noted in the above text. 4.  Preserved LV function.  Recommendations:  1.  Check d-dimer and consider CT if abnormal 2.  Serial ECG and enzymes.  If neg, ambulate, and consider GXT. 3.  If recurrernt pain,  ? FFR and or IVUS of LAD, possible CFX if clearcut symptoms, enzymes, or ECG changes.  Films reviewed in detail with the patient's wife and family  (Familiar to me from care of her dad).     Shawnie Pons 08/27/2011, 8:05 PM

## 2011-08-27 NOTE — ED Notes (Signed)
[facility] called, placed on hold x 7 min. RN unavailable.

## 2011-08-27 NOTE — ED Provider Notes (Cosign Needed)
History   This chart was scribed for Benny Lennert, MD by Sofie Rower. The patient was seen in room APA09/APA09 and the patient's care was started at 11:47AM.    CSN: [id number] Arrival date & time: 08/27/2011 11:06 AM   First MD Initiated Contact with Patient 08/27/11 1143      Chief Complaint  Patient presents with  . Chest Pain    (Consider location/radiation/quality/duration/timing/severity/associated sxs/prior treatment) HPI  [name] [name] is a 65 y.o. male who presents to the Emergency Department complaining of moderate, constant, heavy chest pain located in the left side of the chest, onset last night and progressively worsening this morning. The patient rates the pain as a 5/10 at present. Associated symptoms include shortness of breath over the last two weeks and shakiness. Pt. Denies cough. Patient has a history of MI. The patient has taken two nitro's and an aspirin. PCP is Dr. Daleen Squibb.   Past Medical History  Diagnosis Date  . Myocardial infarct     Past Surgical History  Procedure Date  . Back surgery   . Hernia repair   . Dental surgery   . Coronary angioplasty with stent placement   . Appendectomy   . Knee surgery     left knee replacement  . Carpal tunnel release     No family history on file.  History  Substance Use Topics  . Smoking status: Former Games developer  . Smokeless tobacco: Not on file  . Alcohol Use: No      Review of Systems  10 Systems reviewed and are negative for acute change except as noted in the HPI.   Allergies  Codeine  Home Medications   Current Outpatient Rx  Name Route Sig Dispense Refill  . NITROGLYCERIN 0.4 MG SL SUBL Sublingual Place 0.4 mg under the tongue every 5 (five) minutes as needed.      . ASPIRIN 325 MG PO TABS Oral Take 325 mg by mouth daily.      Marland Kitchen LOSARTAN POTASSIUM 50 MG PO TABS Oral Take 1 tablet (50 mg total) by mouth daily. 90 tablet 0  . METOPROLOL TARTRATE 25 MG PO TABS Oral Take 0.5 tablets (12.5 mg  total) by mouth 2 (two) times daily. 90 tablet 0  . MULTIVITAMIN PO Oral Take by mouth daily.      Marland Kitchen OMEPRAZOLE 20 MG PO CPDR Oral Take 40 mg by mouth daily.      Marland Kitchen SIMVASTATIN 40 MG PO TABS Oral Take 1 tablet (40 mg total) by mouth at bedtime. 90 tablet 0    BP 150/69  Pulse 60  Temp(Src) 97.8 F (36.6 C) (Oral)  Resp 18  Ht 5\' 10"  (1.778 m)  Wt 200 lb (90.719 kg)  BMI 28.70 kg/m2  SpO2 98%  Physical Exam  Nursing note and vitals reviewed. Constitutional: He is oriented to person, place, and time. He appears well-developed and well-nourished.       Patient appears uncomfortable.  HENT:  Head: Normocephalic and atraumatic.  Eyes: Conjunctivae and EOM are normal. No scleral icterus.  Neck: Normal range of motion. Neck supple. No thyromegaly present.  Cardiovascular: Normal rate and regular rhythm.  Exam reveals no gallop and no friction rub.   No murmur heard. Pulmonary/Chest: No stridor. He has no wheezes. He has no rales. He exhibits no tenderness.  Abdominal: He exhibits no distension. There is no tenderness. There is no rebound.  Musculoskeletal: Normal range of motion. He exhibits no edema.  Lymphadenopathy:  He has no cervical adenopathy.  Neurological: He is alert and oriented to person, place, and time. Coordination normal.  Skin: Skin is warm and dry. No rash noted. No erythema.  Psychiatric: He has a normal mood and affect. His behavior is normal.    ED Course  Procedures (including critical care time)  DIAGNOSTIC STUDIES: Oxygen Saturation is 98% on room air, normal by my interpretation.    COORDINATION OF CARE:  11:48AM- EDP at bedside discusses treatment plan.   Results for orders placed during the hospital encounter of 08/27/11  CBC      Component Value Range   WBC 7.1  4.0 - 10.5 (K/uL)   RBC 4.66  4.22 - 5.81 (MIL/uL)   Hemoglobin 13.7  13.0 - 17.0 (g/dL)   HCT 13.0  86.5 - 78.4 (%)   MCV 87.6  78.0 - 100.0 (fL)   MCH 29.4  26.0 - 34.0 (pg)    MCHC 33.6  30.0 - 36.0 (g/dL)   RDW 69.6  29.5 - 28.4 (%)   Platelets 239  150 - 400 (K/uL)  CARDIAC PANEL(CRET KIN+CKTOT+MB+TROPI)      Component Value Range   Total CK 74  7 - 232 (U/L)   CK, MB 2.7  0.3 - 4.0 (ng/mL)   Troponin I <0.30  <0.30 (ng/mL)   Relative Index RELATIVE INDEX IS INVALID  0.0 - 2.5   COMPREHENSIVE METABOLIC PANEL      Component Value Range   Sodium 139  135 - 145 (mEq/L)   Potassium 4.2  3.5 - 5.1 (mEq/L)   Chloride 105  96 - 112 (mEq/L)   CO2 26  19 - 32 (mEq/L)   Glucose, Bld 105 (*) 70 - 99 (mg/dL)   BUN 11  6 - 23 (mg/dL)   Creatinine, Ser 1.32  0.50 - 1.35 (mg/dL)   Calcium 9.8  8.4 - 44.0 (mg/dL)   Total Protein 7.0  6.0 - 8.3 (g/dL)   Albumin 4.0  3.5 - 5.2 (g/dL)   AST 22  0 - 37 (U/L)   ALT 23  0 - 53 (U/L)   Alkaline Phosphatase 198 (*) 39 - 117 (U/L)   Total Bilirubin 0.3  0.3 - 1.2 (mg/dL)   GFR calc non Af Amer 83 (*) >90 (mL/min)   GFR calc Af Amer >90  >90 (mL/min)  POCT I-STAT TROPONIN I      Component Value Range   Troponin i, poc 0.00  0.00 - 0.08 (ng/mL)   Comment 3            Dg Chest Portable 1 View  08/27/2011  *RADIOLOGY REPORT*  Clinical Data: Chest pain  PORTABLE CHEST - 1 VIEW  Comparison: 08/07/2003  Findings: COPD with hyperinflation of the lungs.  Negative for pneumonia.  Negative for heart failure or effusion.  No mass lesion is identified  IMPRESSION: COPD.  No active cardiopulmonary disease.  Original Report Authenticated By: Camelia Phenes, M.D.    Date: 08/27/2011  Rate:65  Rhythm: sinus arrhythmia  QRS Axis: normal  Intervals: normal  ST/T Wave abnormalities: normal  Conduction Disutrbances:none  Narrative Interpretation:   Old EKG Reviewed: none available  CRITICAL CARE Performed by: Elizabelle Fite L   Total critical care time: 35  Critical care time was exclusive of separately billable procedures and treating other patients.  Critical care was necessary to treat or prevent imminent or life-threatening  deterioration.  Critical care was time spent personally by me on the following activities:  development of treatment plan with patient and/or surrogate as well as nursing, discussions with consultants, evaluation of patient's response to treatment, examination of patient, obtaining history from patient or surrogate, ordering and performing treatments and interventions, ordering and review of laboratory studies, ordering and review of radiographic studies, pulse oximetry and re-evaluation of patient's condition.     MDM  Chest pain,  Angina                        The chart was scribed for me under my direct supervision.  I personally performed the history, physical, and medical decision making and all procedures in the evaluation of this patient.Benny Lennert, MD 08/27/11 [phone]

## 2011-08-27 NOTE — H&P (Addendum)
History and Physical  Patient ID: [name] [name] Patient ID: [name] [name] MRN: [id number], DOB/AGE: 1945/10/16 65 y.o. Date of Encounter: 08/27/2011  Primary Physician: [name] Boston, MD Primary Cardiologist: Dr [name] [name] (last ofc visit 07-31-11)  Chief Complaint: chest pain  HPI: [name] is a 65 year old male with a history of coronary artery disease. He has noticed a several month history of increasing dyspnea on exertion. He feels that he does as much as he used to do but admits that it takes longer and he has to stop more frequently to rest. He feels that he gets short of breath with less exertion now than he used to but rarely gets chest pain.  Last p.m., he was at dinner and noticed some substernal chest pain he describes as a pressure. It reached a 4/10. He did not take any medications for the pain. When he went to bed the pain has decreased to a 2/10. He was able to sleep. Upon waking this a.m. he was not having any pain. However, after breakfast he developed substernal chest pain again. It reminded him of his MI pain in 2004. The pain continued so he took a sublingual nitroglycerin from his home and took his aspirin. His wife drove him to the hospital. En route he took another nitroglycerin. He was having ongoing chest pain/pressure as well as sharp pains that would reach a 10/10. The pressure would reach a 6 or 7/10. Upon arrival to any p.m. he received more nitroglycerin and was started on an IV drip. He also received IV heparin. His pain decreased to a 2/10. He was transported by [facility] to [facility]. In route he received 4 mg of morphine. Currently he is pain-free.  Past Medical History  Diagnosis Date  . Myocardial infarction November 2004  FINDINGS:  1. Left main trunk:  Large caliber vessel with mild irregularities.  2. LAD:  This begins as a large caliber vessel and tapers in the distal section.  Provides two diagonal branches.  The proximal LAD has mild disease of  30%.  The mid LAD has eccentric plaque of 50%.  The distal LAD has a high grade narrowing of 95% medially after the second diagonal branch with TIMI 2 flow distally.  The first diagonal branch has an ostial narrowing of 50-60%.  The second diagonal branch has an ostial narrowing of 30-40% and is a large caliber vessel.  3. Left circumflex artery:  This is a medium caliber vessel.  Provides trivial first marginal branch in the mid section and two marginal branches distally.  The proximal AV circumflex has high grade narrowing of 70-80%.  There is then further narrowing of 50-60% after the first marginal branch.  The first marginal branch has long tubular narrowing of 50% in the proximal segment.  The second marginal branch has an ostial     narrowing of 80%.  The third marginal branch has proximal narrowing of 70%.  4. Right coronary artery:  Dominant.  This is a large caliber vessel. Provides the posterior descending artery and posterior ventricular branch in terminal segment.  The right coronary artery has diffuse disease of 30-40% in the mid and distal section.  5. LV:  Normal end-systolic and end-diastolic dimensions.  Overall left ventricular function is well preserved.  Ejection fraction greater than 55%.  No mitral regurgitation.  There is akinesis of a small portion of the mid anterior wall.  LV pressure is 110/10.  Aortic is 110/60.  LVEDP equals 15.  He had successful PTCA and stent placement in the distal LAD with reduction of 95% narrowing to 0% with placement of a 2.5 x 18 mm Cypher drug-eluting stent dilated to 2.75 mm and improvement of TIMI grade 2 to TIMI  grade 3 flow.    December 2004 - staged intervention FINAL RESULTS:  Successful percutaneous transluminal coronary angioplasty and stent placement in proximal AV circumflex with reduction of 70% narrowing to 0% with placement of a 3.0 x 8-mm Cypher drug-eluting stent followed by a second 3.0 x 8-mm Cypher stent.    ANGIOGRAPHIC FINDINGS:    1. The left main coronary artery is free of significant flow-limiting       coronary atherosclerosis and gives rise to the left anterior       descending and circumflex vessels.   2. The left anterior descending is a medium-caliber vessel that tapers       towards the distal aspect.  There is mild 20% atherosclerotic       plaque within the proximal portion of the vessel.  A stent is       visualized within the midportion of the vessel, around the takeoff       of a large diagonal branch and also the septal perforator.  There       is mild atherosclerosis within the stent to approximately 20%,       although no flow-limiting stenosis is noted.  The ostium of the       diagonal branch is stenosed at approximately 50%.  There is also       distally within the diagonal approximately 20% stenosis.  The       origin of the septal perforator is pinched in the area of the       stent, likely an old finding.   3. The circumflex vessel is medium in caliber..  There are three       obtuse marginal branches.  A stent is visualized in the proximal       portion of the vessel.  There is 20%-30% stenosis noted in the       midportion of the circumflex distal to the stent.  There is       approximately 30% stenosis also within the third obtuse marginal       proximal portion.  No flow-limiting stenoses are noted.   4. The right coronary artery is a medium to large-caliber dominant       vessel, with large posterior descending branch.  Minor luminal       irregularities are noted throughout the vessel predominately in the       mid and distal portion.  These areas are in the 20%-30% range.       There are no critical flow-limiting stenoses noted.      Ventriculography is performed in the RAO projection and reveals an   ejection fraction of approximately 60%.  There is 1+ mitral   regurgitation noted.     OA    Dyslipidemia    Tobacco    Myoview Feb. 2011 - good LV function, no ischemia, and no  infarction. His ejection fraction was 61%.     GERD      Surgical History:  Past Surgical History  Procedure Date  . Back surgery   . Hernia repair   . Dental surgery   . Coronary angioplasty with stent placement   . Appendectomy   . Knee surgery  left knee replacement  . Carpal tunnel release      I have reviewed the patient's current medications. Scheduled:  . heparin  16 Units/kg/hr Intravenous Once  . heparin  4,000 Units/hr Intravenous Once  . morphine  4 mg Intravenous Once  . morphine      . nitroGLYCERIN  5-200 mcg/min Intravenous Once  . nitroGLYCERIN      . sodium chloride  3 mL Intravenous Q12H   Outpatient Meds:  NITROGLYCERIN 0.4 MG SL SUBL   Place 0.4 mg under the tongue every 5 (five) minutes as needed.  ASPIRIN 325 MG PO TABS .  LOSARTAN POTASSIUM 50 MG PO TABS  daily.  METOPROLOL TARTRATE 25 MG PO TABS Take 0.5 tablets (12.5 mg total) by mouth 2 (two) times daily.   MULTIVITAMIN PO daily.  OMEPRAZOLE 20 MG PO CPDR Take 40 mg by mouth daily.  SIMVASTATIN 40 MG PO TABS   Allergies:  Allergies  Allergen Reactions  . Codeine    History   Social History  . Marital Status: Married    Spouse Name: N/A    Number of Children: N/A  . Years of Education: N/A   Social History Main Topics  . Smoking status: Former Games developer, Quit about 2000  . Smokeless tobacco: Not on file  . Alcohol Use: No  . Drug Use: No  . Sexually Active: Not on file   Social History Narrative  .   He lives in [place].  Retired from the [employer] of [employer].  He is a former smoker but quit about 12 years ago.  He does not drink alcohol.  He does not use drugs.    Family history: Significant for coronary disease in his mother, which  started in her 65s. She is alive at age 41. He also has a brother who reportedly had an MI and died  in his 75s.  Review of Systems: Chronic dyspnea on exertion, worsened over the last 6 months. He occasionally has indigestion but denies  melena or hematemesis. He has not had any exertional chest pain. He denies wheezing or coughing.  All other systems reviewed and are otherwise negative except as noted above.  Physical Exam: Blood pressure 142/79, pulse 56, temperature 97.8 F (36.6 C), temperature source Oral, resp. rate 18, height 5\' 10"  (1.778 m), weight 200 lb (90.719 kg), SpO2 99.00%. General: Well developed, well nourished, in no acute distress. Head: Normocephalic, atraumatic, sclera non-icteric, no xanthomas, nares are without discharge.  Neck: Negative for carotid bruits. JVD not elevated. No thyromegally Lungs: Clear bilaterally to auscultation without wheezes, or rhonchi. A few dry rales are noted. Breathing is unlabored. Heart: RRR with S1 S2. No murmurs, rubs, or gallops appreciated. Heart sounds are faint Abdomen: Soft, non-tender, non-distended with normoactive bowel sounds. No hepatomegaly. No rebound/guarding. No obvious abdominal masses. Msk:  Strength and tone appear normal for age. Extremities: No clubbing or cyanosis. No edema.  Distal pedal pulses are 2+ and equal bilaterally. Neuro: Alert and oriented X 3. Moves all extremities spontaneously. No focal deficits noted. Psych:  Responds to questions appropriately with a normal affect. Skin: No rashes or lesions noted  Labs:  Lab Results  Component Value Date   WBC 7.1 08/27/2011   HGB 13.7 08/27/2011   HCT 40.8 08/27/2011   MCV 87.6 08/27/2011   PLT 239 08/27/2011    Lab 08/27/11 1105  NA 139  K 4.2  CL 105  CO2 26  BUN 11  CREATININE 0.98  CALCIUM 9.8  PROT 7.0  BILITOT 0.3  ALKPHOS 198*  ALT 23  AST 22  GLUCOSE 105*    Basename 08/27/11 1105  CKTOTAL 74  CKMB 2.7  TROPONINI <0.30   Radiology/Studies:  Dg Chest Portable 1 View 08/27/2011  *RADIOLOGY REPORT*  Clinical Data: Chest pain  PORTABLE CHEST - 1 VIEW  Comparison: 08/07/2003  Findings: COPD with hyperinflation of the lungs.  Negative for pneumonia.  Negative for heart  failure or effusion.  No mass lesion is identified   IMPRESSION: COPD.  No active cardiopulmonary disease.  Original Report Authenticated By: Camelia Phenes, M.D.    EKG: SR, 65bpm, No pathologic Qwaves, no ST/T wave changes  ASSESSMENT AND PLAN:  Mr. Primeau is a 65 year old male with a history of coronary artery disease who has had prolonged chest pain today. Initial cardiac enzymes are negative. We will continue the IV nitroglycerin and heparin. We will cycle cardiac enzymes. Currently his EKG is nonacute the abnormal and he is pain-free. The risks and benefits of cardiac catheterization were discussed with the patient, his wife and son. They indicate understanding and agree to proceed. We will schedule this for tomorrow and continue current medical therapy until then.  Signed, Bjorn Loser Barrett PA-C 08/27/2011, 2:53 PM  Attending Note:   The patient was seen and examined.  Agree with assessment and plan as noted above.  Hx of stenting of the LCx. Pt has had progressive declining exercise capacity with DOE.  He developed chest pain last night and presented to the [facility] ER this AM  The pains were described a squeezing , pressure like pain with intermittant sharp pains.  No pleuritic CP. Not dyspneic at rest.  Exam: see above, lungs are clear, cor: RR, no edema  ECG:  NSR, no ST or T wave changes.  Not uncommon with a LCx lesion.  Initial set of cardiac enzymes are negative.  Ordered repeat stat set now Patient is comfortable at present  Alvia Grove., MD, [facility] 08/27/2011, 4:05 PM  The patient is seen.  Old films were reviewed.  He has a headache but is on NTG at present.  We will stop that.  He has had some nausea.  Dr. Elease Hashimoto wanted him studied today however.  Diagnostic cath will be performed to exclude critical disease.  The patient is agreeable to proceed.    Shawnie Pons 6:43 PM 08/27/2011

## 2011-08-28 ENCOUNTER — Other Ambulatory Visit: Payer: Self-pay

## 2011-08-28 LAB — COMPREHENSIVE METABOLIC PANEL
AST: 20 U/L (ref 0–37)
Albumin: 3.5 g/dL (ref 3.5–5.2)
CO2: 25 mEq/L (ref 19–32)
Calcium: 8.8 mg/dL (ref 8.4–10.5)
Creatinine, Ser: 0.81 mg/dL (ref 0.50–1.35)
GFR calc non Af Amer: 90 mL/min — ABNORMAL LOW (ref >90)

## 2011-08-28 LAB — CARDIAC PANEL(CRET KIN+CKTOT+MB+TROPI)
CK, MB: 2.1 ng/mL (ref 0.3–4.0)
CK, MB: 2.2 ng/mL (ref 0.3–4.0)
CK, MB: 2.2 ng/mL (ref 0.3–4.0)
Relative Index: INVALID (ref 0.0–2.5)
Troponin I: <0.3 ng/mL (ref <0.30)

## 2011-08-28 LAB — LIPID PANEL: HDL: 31 mg/dL — ABNORMAL LOW (ref >39)

## 2011-08-28 LAB — GLUCOSE, CAPILLARY: Glucose-Capillary: 89 mg/dL (ref 70–99)

## 2011-08-28 MED ORDER — HEPARIN SOD (PORCINE) IN D5W 100 UNIT/ML IV SOLN
1300.0000 [IU]/h | INTRAVENOUS | Status: DC
  Administered 2011-08-28: 1300 [IU]/h via INTRAVENOUS
  Filled 2011-08-28: qty 250

## 2011-08-28 MED ORDER — GUAIFENESIN 100 MG/5ML PO SOLN
5.0000 mL | ORAL | Status: DC | PRN
  Administered 2011-08-28 – 2011-08-29 (×3): 100 mg via ORAL
  Filled 2011-08-28 (×3): qty 5

## 2011-08-28 MED ORDER — ENOXAPARIN SODIUM 40 MG/0.4ML ~~LOC~~ SOLN
40.0000 mg | SUBCUTANEOUS | Status: DC
  Administered 2011-08-28 – 2011-08-29 (×2): 40 mg via SUBCUTANEOUS
  Filled 2011-08-28 (×3): qty 0.4

## 2011-08-28 NOTE — Progress Notes (Signed)
Subjective:  Continues to have some chest pain.  It is perhaps slightly worse with a breath in, not worse with raising his arms.  About a 2 or 3, started when he exerted himself.    Objective:  Vital Signs in the last 24 hours: Temp:  [97.6 F (36.4 C)-98.3 F (36.8 C)] 98.3 F (36.8 C) (12/11 0800) Pulse Rate:  [49-65] 51  (12/11 0200) Resp:  [14-21] 16  (12/10 2300) BP: (103-150)/(54-83) 121/65 mmHg (12/11 0700) SpO2:  [94 %-99 %] 98 % (12/11 0700) FiO2 (%):  [100 %] 100 % (12/10 1336) Weight:  [90.719 kg (200 lb)] 200 lb (90.719 kg) (12/10 1113)  Intake/Output from previous day: 12/10 0701 - 12/11 0700 In: 363 [P.O.:360; I.V.:3] Out: -    Physical Exam: General: Well developed, well nourished, in no acute distress. Head:  Normocephalic and atraumatic. Lungs: Clear to auscultation and percussion. Heart: Normal S1 and S2.  No murmur, rubs or gallops.  Pulses: Pulses normal in all 4 extremities. Extremities: No clubbing or cyanosis. No edema. Neurologic: Alert and oriented x 3.    Lab Results:  [facility] 08/27/11 2100 08/27/11 1105  WBC 9.6 7.1  HGB 12.6* 13.7  PLT 212 239    Basename 08/27/11 2100 08/27/11 1105  NA -- 139  K -- 4.2  CL -- 105  CO2 -- 26  GLUCOSE -- 105*  BUN -- 11  CREATININE 0.88 0.98    Basename 08/27/11 1659 08/27/11 1105  TROPONINI <0.30 <0.30   Hepatic Function Panel  Basename 08/27/11 1105  PROT 7.0  ALBUMIN 4.0  AST 22  ALT 23  ALKPHOS 198*  BILITOT 0.3  BILIDIR --  IBILI --    Basename 08/28/11 0505  CHOL 127   No results found for this basename: PROTIME in the last 72 hours  Imaging: Dg Chest Portable 1 View  08/27/2011  *RADIOLOGY REPORT*  Clinical Data: Chest pain  PORTABLE CHEST - 1 VIEW  Comparison: 08/07/2003  Findings: COPD with hyperinflation of the lungs.  Negative for pneumonia.  Negative for heart failure or effusion.  No mass lesion is identified  IMPRESSION: COPD.  No active cardiopulmonary disease.   Original Report Authenticated By: Camelia Phenes, M.D.    EKG:  pending  Cardiac Studies:  Enzymes negative early.  Assessment/Plan:  Chest pain   -  Not sure of etiology.  Will review films again with colleagues and consider IVUS.  Recheck enzymes and redo ECG.  Also will get early echo.  D-dimer was negative, so doubt PE.  Old and new angios reviewed with family in detail, and not clear source.       Shawnie Pons, MD, [facility], FSCAI 08/28/2011, 9:13 AM

## 2011-08-28 NOTE — Progress Notes (Signed)
Pt ambulated to bathroom, bedrest complete. When pt gets back in bed. C/o midsternal chest pain 9/10. Worse with deep breathing. EKG done. No changes observed. Nitro sublingula 0.4mg  x1 given. O2 @ 2L [place] applied. Will notify MD on call.

## 2011-08-28 NOTE — Progress Notes (Addendum)
After nitro, pt states pain has decreased to 4/10. Offered 2nd nitro and pt refused. C/o HA at this time. Morphine 2 mg IV given. Will monitor.

## 2011-08-28 NOTE — Progress Notes (Signed)
Patient ID: [name] [name], male   DOB: [birth year], 65 y.o.   MRN: [id number] Patient continues to have some intermittent chest discomfort.  Repeat cardiac enzymes are negative.  LFTs are negative as well.  Exam unremarkable.  Repeat ECG is normal.  Films were reviewed with Dr. Excell Seltzer and then with Dr. Katrinka Blazing.  All agree at this point of continued medical therapy as there does not appear to be a clear cut culprit.  Will stop IV heparin with negative enzymes, and then resume low dose enox.   Try to ambulate tomorrow. As his brother had gallstones with similar findings, will order [place].   Discussed with patient.    Shawnie Pons 1:45 PM 08/28/2011

## 2011-08-28 NOTE — Progress Notes (Signed)
ANTICOAGULATION CONSULT NOTE - Follow Up Consult  Pharmacy Consult for IV Heparin Indication: chest pain/ACS  Allergies  Allergen Reactions  . Codeine     Patient Measurements: Height: 5\' 10"  (177.8 cm) Weight: 200 lb (90.719 kg) IBW/kg (Calculated) : 73  Heparin Dosing Weight: 90.7 kg  Vital Signs: Temp: 98.3 F (36.8 C) (12/11 0800) Temp src: Oral (12/11 0800) BP: 121/65 mmHg (12/11 0700) Pulse Rate: 51  (12/11 0200)  Labs:  Basename 08/27/11 2100 08/27/11 1700 08/27/11 1659 08/27/11 1105  HGB 12.6* -- -- 13.7  HCT 37.7* -- -- 40.8  PLT 212 -- -- 239  APTT -- -- -- --  LABPROT -- 13.8 -- --  INR -- 1.04 -- --  HEPARINUNFRC -- -- -- --  CREATININE 0.88 -- -- 0.98  CKTOTAL -- -- 56 74  CKMB -- -- 2.5 2.7  TROPONINI -- -- <0.30 <0.30   Estimated Creatinine Clearance: 92.3 ml/min (by C-G formula based on Cr of 0.88).   Medications:  Infusions:    . DISCONTD: sodium chloride    . DISCONTD: heparin 14.5 mL/hr (08/27/11 1800)  . DISCONTD: nitroGLYCERIN Stopped (08/27/11 1849)    Assessment: 65 year old male with persistent chest pain s/p cath to restart IV heparin per Dr. Riley Kill without a bolus and for lower goal of 0.3 to 0.5.  CBC stable, no bleeding reported. D-dimer negative. Rechecking cardiac enzymes.   Goal of Therapy:  Heparin level of 0.3 to 0.5 per Dr. Riley Kill   Plan:  1. Restart heparin at 1300 units/hr (13 ml/hr). 2. No bolus. 3. Will follow-up a 6 hour heparin level.  Fayne Norrie 08/28/2011,9:36 AM

## 2011-08-28 NOTE — Progress Notes (Signed)
MD on call aware of episode. No new orders obtained. Morphine affective for pain at this time. Will monitor.

## 2011-08-29 ENCOUNTER — Inpatient Hospital Stay (HOSPITAL_COMMUNITY): Payer: Medicare Other

## 2011-08-29 DIAGNOSIS — R072 Precordial pain: Secondary | ICD-10-CM

## 2011-08-29 DIAGNOSIS — R079 Chest pain, unspecified: Secondary | ICD-10-CM

## 2011-08-29 DIAGNOSIS — R0789 Other chest pain: Secondary | ICD-10-CM

## 2011-08-29 LAB — CBC
MCV: 86.9 fL (ref 78.0–100.0)
Platelets: 231 10*3/uL (ref 150–400)
RBC: 4.73 MIL/uL (ref 4.22–5.81)
WBC: 9.1 10*3/uL (ref 4.0–10.5)

## 2011-08-29 NOTE — Progress Notes (Signed)
CARDIAC REHAB PHASE I   PRE:  Rate/Rhythm: 89 SR  BP:  Supine:   Sitting: 122/75  Standing:    SaO2: 94 RA  MODE:  Ambulation: 700 ft   POST:  Rate/Rhythem: 78 SR  BP:  Supine:   Sitting: 125/71  Standing:    SaO2: 91 RA [id number]  Assisted X 1 to ambulate. Pt limps on right knee, states he needs knee replacement. Walked 700 ft, at end of walk c/o of chest pressure 2-3 that went away with rest. Back to recliner after walk with call light in reach. Completed discharge education with pt and wife.  He agrees to [employer]. CRP in [place], will send referral. Reported pt's c/o of cp to RN.  Beatrix Fetters

## 2011-08-29 NOTE — Progress Notes (Signed)
Subjective:  Patient is stable.  He had one brief episode of pain last pm, but none last night or this am.  Not worse with inspiration.    Objective:  Vital Signs in the last 24 hours: Temp:  [97.4 F (36.3 C)-98.4 F (36.9 C)] 98.4 F (36.9 C) (12/12 0400) Pulse Rate:  [63-68] 68  (12/11 2210) Resp:  [18-20] 20  (12/12 0400) BP: (108-145)/(38-77) 108/77 mmHg (12/12 0600) SpO2:  [92 %-97 %] 95 % (12/12 0600)  Intake/Output from previous day: 12/11 0701 - 12/12 0700 In: 1252 [P.O.:1200; I.V.:52] Out: 950 [Urine:950]   Physical Exam: General: Well developed, well nourished, in no acute distress. Head:  Normocephalic and atraumatic. Lungs: Clear to auscultation and percussion. Heart: Normal S1 and S2.  No murmur, rubs or gallops. Abdomen:  No tenderness.   Pulses: Pulses normal in all 4 extremities. Extremities: No clubbing or cyanosis. No edema. Neurologic: Alert and oriented x 3.    Lab Results:  Basename 08/29/11 0518 08/27/11 2100  WBC 9.1 9.6  HGB 13.5 12.6*  PLT 231 212    Basename 08/28/11 0950 08/27/11 2100 08/27/11 1105  NA 137 -- 139  K 3.6 -- 4.2  CL 103 -- 105  CO2 25 -- 26  GLUCOSE 145* -- 105*  BUN 10 -- 11  CREATININE 0.81 0.88 --    Basename 08/28/11 1740 08/28/11 1145  TROPONINI <0.30 <0.30   Hepatic Function Panel  Basename 08/28/11 0950  PROT 6.5  ALBUMIN 3.5  AST 20  ALT 21  ALKPHOS 170*  BILITOT 0.3  BILIDIR --  IBILI --    Basename 08/28/11 0505  CHOL 127   No results found for this basename: PROTIME in the last 72 hours  Imaging: Dg Chest Portable 1 View  08/27/2011  *RADIOLOGY REPORT*  Clinical Data: Chest pain  PORTABLE CHEST - 1 VIEW  Comparison: 08/07/2003  Findings: COPD with hyperinflation of the lungs.  Negative for pneumonia.  Negative for heart failure or effusion.  No mass lesion is identified  IMPRESSION: COPD.  No active cardiopulmonary disease.  Original Report Authenticated By: Camelia Phenes, M.D.    EKG:   No acute changes  Cardiac Studies:  Echo being ordered  Assessment/Plan Patient Active Hospital Problem List: Chest pain (08/29/2011)   Assessment: Symptoms are improved   Plan: US GB today, also 2D echo today.     Ambulate in hall.     Possibly home tomorrow.                      Patient Active Hospital Problem List: No active hospital problems.      Shawnie Pons, MD, [facility], FSCAI 08/29/2011, 8:31 AM

## 2011-08-29 NOTE — Progress Notes (Signed)
  Echocardiogram 2D Echocardiogram has been performed.  [name] [name] [name] 08/29/2011, 12:26 PM

## 2011-08-30 DIAGNOSIS — R079 Chest pain, unspecified: Secondary | ICD-10-CM

## 2011-08-30 LAB — CBC
Hemoglobin: 13.8 g/dL (ref 13.0–17.0)
MCH: 29.1 pg (ref 26.0–34.0)
MCV: 86.9 fL (ref 78.0–100.0)
RBC: 4.74 MIL/uL (ref 4.22–5.81)
WBC: 12.2 10*3/uL — ABNORMAL HIGH (ref 4.0–10.5)

## 2011-08-30 MED ORDER — ASPIRIN 81 MG PO CHEW: 81.0000 mg | CHEWABLE_TABLET | Freq: Every day | ORAL | Status: AC

## 2011-08-30 MED ORDER — METOPROLOL TARTRATE 25 MG PO TABS: 25.0000 mg | ORAL_TABLET | Freq: Two times a day (BID) | ORAL | Status: DC

## 2011-08-30 MED ORDER — METOPROLOL TARTRATE 25 MG PO TABS
25.0000 mg | ORAL_TABLET | Freq: Two times a day (BID) | ORAL | Status: DC
  Administered 2011-08-30: 25 mg via ORAL

## 2011-08-30 MED ORDER — LOSARTAN POTASSIUM 50 MG PO TABS: 50.0000 mg | ORAL_TABLET | Freq: Every day | ORAL | Status: DC

## 2011-08-30 MED ORDER — METOPROLOL TARTRATE 25 MG PO TABS: 12.5000 mg | ORAL_TABLET | Freq: Two times a day (BID) | ORAL | Status: DC

## 2011-08-30 NOTE — Progress Notes (Signed)
Patient ID: [name] [name], male   DOB: November 11, 1945, 65 y.o.   MRN: [id number] SUBJECTIVE: Has been ambulating in the unit without increase in CP. Still has intermittent chest ache but no increase with breating. Abdominal US negative for gallstones  Filed Vitals:   08/30/11 0500 08/30/11 0600 08/30/11 0700 08/30/11 0752  BP: 118/70 120/78 134/84   Pulse:      Temp:    98.1 F (36.7 C)  TempSrc:    Oral  Resp:      Height:      Weight: 88.2 kg (194 lb 7.1 oz)     SpO2: 94% 93% 94%     Intake/Output Summary (Last 24 hours) at 08/30/11 0755 Last data filed at 08/30/11 0752  Gross per 24 hour  Intake   1080 ml  Output   1700 ml  Net   -620 ml    LABS: Basic Metabolic Panel:  Basename 08/28/11 0950 08/27/11 2100 08/27/11 1700 08/27/11 1105  NA 137 -- -- 139  K 3.6 -- -- 4.2  CL 103 -- -- 105  CO2 25 -- -- 26  GLUCOSE 145* -- -- 105*  BUN 10 -- -- 11  CREATININE 0.81 0.88 -- --  CALCIUM 8.8 -- -- 9.8  MG -- -- 2.0 --  PHOS -- -- -- --   Liver Function Tests:  [facility] 08/28/11 0950 08/27/11 1105  AST 20 22  ALT 21 23  ALKPHOS 170* 198*  BILITOT 0.3 0.3  PROT 6.5 7.0  ALBUMIN 3.5 4.0   No results found for this basename: LIPASE:2,AMYLASE:2 in the last 72 hours CBC:  Basename 08/30/11 0538 08/29/11 0518  WBC 12.2* 9.1  NEUTROABS -- --  HGB 13.8 13.5  HCT 41.2 41.1  MCV 86.9 86.9  PLT 243 231   Cardiac Enzymes:  Basename 08/28/11 1740 08/28/11 1145 08/28/11 0950  CKTOTAL 49 48 49  CKMB 2.2 2.2 2.1  CKMBINDEX -- -- --  TROPONINI <0.30 <0.30 <0.30   BNP: No components found with this basename: POCBNP:3 D-Dimer:  Basename 08/27/11 1920  DDIMER 0.28   Hemoglobin A1C:  Basename 08/27/11 1700  HGBA1C 6.1*   Fasting Lipid Panel:  Basename 08/28/11 0505  CHOL 127  HDL 31*  LDLCALC 58  TRIG 132*  CHOLHDL 4.1  LDLDIRECT --   Thyroid Function Tests:  Basename 08/27/11 1700  TSH 1.008  T4TOTAL --  T3FREE --  THYROIDAB --   Anemia Panel: No  results found for this basename: VITAMINB12,FOLATE,FERRITIN,TIBC,IRON,RETICCTPCT in the last 72 hours  RADIOLOGY: US Abdomen Complete  08/29/2011  *RADIOLOGY REPORT*  Clinical Data:  Chest pain  ABDOMINAL ULTRASOUND COMPLETE  Comparison:  None.  Findings:  Gallbladder:  No gallstones, gallbladder Matti Killingsworth thickening, or pericholecystic fluid.  Common Bile Duct:  Within normal limits in caliber.  Liver: Diffusely increased in echogenicity without focal mass.  IVC:  Appears normal.  Pancreas:  Heterogeneous without definite focal mass.  Spleen:  Within normal limits in size and echotexture.  Right kidney:  Normal in size and parenchymal echogenicity.  No evidence of mass or hydronephrosis.  Left kidney:  Normal in size and parenchymal echogenicity.  No evidence of mass or hydronephrosis.  Abdominal Aorta:  No aneurysm identified.  IMPRESSION: Increased liver echogenicity likely due to steatosis.  Pancreas is heterogeneous without definite focal mass.  No gallstones.  Original Report Authenticated By: Donavan Burnet, M.D.   Dg Chest Portable 1 View  08/27/2011  *RADIOLOGY REPORT*  Clinical Data: Chest pain  PORTABLE CHEST - 1 VIEW  Comparison: 08/07/2003  Findings: COPD with hyperinflation of the lungs.  Negative for pneumonia.  Negative for heart failure or effusion.  No mass lesion is identified  IMPRESSION: COPD.  No active cardiopulmonary disease.  Original Report Authenticated By: Camelia Phenes, M.D.    PHYSICAL EXAM General: Well developed, well nourished, in no acute distress Head: Eyes PERRLA, No xanthomas.   Normal cephalic and atramatic  Lungs: Clear bilaterally to auscultation and percussion. Heart: HRRR S1 S2, Pulses are 2+ & equal.            No carotid bruit. No JVD.  No abdominal bruits. No femoral bruits. Abdomen: Bowel sounds are positive, abdomen soft and non-tender without masses or                  Hernia's noted. Msk:  Back normal, normal gait. Normal strength and tone for  age. Extremities: No clubbing, cyanosis or edema.  DP +1 Neuro: Alert and oriented X 3. Psych:  Good affect, responds appropriately  TELEMETRY: Reviewed telemetry pt in NSR  ASSESSMENT AND PLAN:  Active Problems:  CAD, NATIVE VESSEL  Chest pain  As reviewed by Dr Riley Kill, no obvious cause for discomfort. D Dimer also negative as is CXR. Will discharge home today with adjustment of meds and SL NTG. Will increase metoprolol to 25mg  po bid. Follow up in office with me in 2 weeks or so. Not high risk.  Valera Castle, MD 08/30/2011 7:55 AM

## 2011-08-30 NOTE — Discharge Summary (Signed)
Discharge Summary   Patient ID: [name] [name],  MRN: [id number], DOB/AGE: August 11, 1946 65 y.o.  Admit date: 08/27/2011 Discharge date: 08/30/2011  Discharge Diagnoses Principal Problem:  *Chest pain Active Problems:  CAD, NATIVE VESSEL  GERD   Allergies Allergies  Allergen Reactions  . Codeine     Procedures:   Abdominal ultrasound Findings:  Gallbladder: No gallstones, gallbladder wall thickening, or  pericholecystic fluid.  Common Bile Duct: Within normal limits in caliber.  Liver: Diffusely increased in echogenicity without focal mass.  IVC: Appears normal.  Pancreas: Heterogeneous without definite focal mass.  Spleen: Within normal limits in size and echotexture.  Right kidney: Normal in size and parenchymal echogenicity. No  evidence of mass or hydronephrosis.  Left kidney: Normal in size and parenchymal echogenicity. No  evidence of mass or hydronephrosis.  Abdominal Aorta: No aneurysm identified.  IMPRESSION:  Increased liver echogenicity likely due to steatosis.  Pancreas is heterogeneous without definite focal mass.  No gallstones.    Left heart cath, selective coronary angiography, LV angiography Procedural Findings:  Hemodynamics:  AO 146/68 (94)  LV 150/10/24  Coronary angiography:  Coronary dominance: right  Left mainstem: No significant obstruction  Left anterior descending (LAD): Courses to the apex and has one major diagonal branch. Prior to the diagonal takeoff there is an area of hypodensity in the LAO cranial view, with some narrowing, also seen on the 2008 study. Multiple views of this area were obtained, including LAO caudal and RAO cranial. RAO, and RAO caudal also did not suggest high grade obstruction. As on the previous study, there was diagonal narrowing of probably 70-75% at the ostium, but unchanged. The stent had about 30% narrowing, unchanged from 2008, The distal LAD is without critical narrowing.  Left circumflex (LCx): The  circumflex provides a small OM 1 without narrowing. The stent beyond this is widely patent (placed 2004). Just after OM 2 is an area of narrowing of 50-60%, perhaps more prominent on the current study as the distal vessel is more widely dilated. The OM3 has ostial 75%, previously noted as well.  Right coronary artery (RCA): The RCA is a large caliber, dominant vessel. There is 20-30 mid eccentric plaque, and a distal 30-40% plaque, with mild PDA disease.  Left ventriculography: Left ventricular systolic function is normal, LVEF is estimated at 55-65%, there is no significant mitral regurgitation.  Final Conclusions:  1. Continued patency of LAD and CFX stents.  2. 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  3. Diffuse plaque proximal LAD and CFX after stent site as noted in the above text.  4. Preserved LV function.  2D echocardiogram without contrast  Study Conclusions  Left ventricle: The cavity size was normal. Wall thickness was normal. Systolic function was normal. The estimated ejection fraction was in the range of 50% to 55%. Doppler parameters are consistent with abnormal left ventricular relaxation (grade 1 diastolic dysfunction).  Impressions:  - Extremely limited due to poor sound wave transmission; LV function ? low normal; cannot R/O wall motion abnormality; suggest MUGA or cardiac MRI if clinically indicated. Transthoracic echocardiography. M-mode, complete 2D, spectral Doppler, and color Doppler. Height: Height: 177.8cm. Height: 70in. Weight: Weight: 90.7kg. Weight: 199.5lb. Body mass index: BMI: 28.7kg/m^2. Body surface area: BSA: 2.68m^2. Blood pressure: 122/75. Patient status: Inpatient. Location: ICU/CCU ------------------------------------------------------------ Left ventricle: The cavity size was normal. Wall thickness was normal. Systolic function was normal. The estimated ejection fraction was in the range of 50% to 55%. Images were inadequate for  LV wall  motion assessment. Doppler parameters are consistent with abnormal left ventricular relaxation (grade 1 diastolic dysfunction). ------------------------------------------------------------ Aortic valve: Poorly visualized. Doppler: Transvalvular velocity was within the normal range. There was no stenosis. No regurgitation.  ------------------------------------------------------------ Aorta: Aortic root: The aortic root was normal in size.  ------------------------------------------------------------ Mitral valve: Structurally normal valve. Mobility was not restricted. Doppler: Transvalvular velocity was within the normal range. There was no evidence for stenosis. No regurgitation.  ------------------------------------------------------------ Left atrium: The atrium was normal in size.  ------------------------------------------------------------ Right ventricle: The cavity size was normal. Systolic function was normal.  ------------------------------------------------------------ Pulmonic valve: Doppler: Transvalvular velocity was within the normal range. There was no evidence for stenosis.  ------------------------------------------------------------ Tricuspid valve: Poorly visualized. Doppler: Transvalvular velocity was within the normal range. No Regurgitation.  ------------------------------------------------------------ Right atrium: The atrium was normal in size.  ------------------------------------------------------------ Pericardium: There was no pericardial effusion.  ------------------------------------------------------------ Systemic veins: Inferior vena cava: The vessel was normal in size.   History of Present Illness: [name] [name] is a 65 yo Caucasian male with PMHx significant for CAD (s/p [name] [name] DES to prox LAD and LCx in 11/04 and 12/04, respectively; patent stents on repeat cath in 2008) admitted to [facility] with chest pain.   Patient reports a several month history of  increasing dyspnea on exertion and decreased exercise tolerance; however reports that he rarely gets chest pain. On 12/9, 12/10 he experienced intermittent substernal chest pain described as pressure initially rated at a 4/10 progressing to 10/10. After eating breakfast on 12/10 he developed substernal chest pain again compared to MI pain in 2004. The pain persisted and he took a nitroglycerin and daily full-dose aspirin. His wife drove him to [facility] ED where he took another nitroglycerin in transit without relief. Upon arrival he was started on nitroglycerin and heparin IV. His pain was moderately relieved, was found to be stable and he was transported to [facility].  Hospital Course:   Upon arrival to [facility] ED, initial cardiac enzymes were found to be negative with no acute changes on EKG. Morphine was given which relieved his pain. Given his cardiac history diagnostic cath was scheduled for the following morning.  Left cardiac catheterization revealed the above results, most notably patency of the LAD and circumflex stents, ostial disease of OM 3 and diagonal with diffuse plaque in the proximal LAD and circumflex, preserved LV function. Recommendation was made to check d-dimer to rule out PE, enzymes returned and and serial EKGs ordered.  During admission the patient continued to have some atypical chest pain rated at a 2 or 3/10. No pattern was made as to if his pain was exacerbated by exertion, as it occurred at rest also. Chest x-ray was unremarkable revealing only hyperinflation consistent with COPD. D-dimer negative, CEs neg x 5, no acute EKG or telemetry changes. LFTs and abdominal ultrasound unremarkable except for liver steatosis. 2-D echocardiogram revealed the above results most notably LVEF 50-55% and no wall thickness.  As there is no clear etiology to his chest pain, he was continued on medical therapy. Today he ambulated in the unit without chest pain; however, intermittent chest  discomfort persists. He is stable at baseline will be discharged home today with medication adjustments. He will followup in 2-4 weeks to reevaluate his chest pain and to assess need for outpatient stress testing.   Discharge Vitals:  Blood pressure 134/84, pulse 68, temperature 98.1 F (36.7 C), temperature source Oral, resp. rate 22, height 5\' 10"  (1.778 m), weight 88.2 kg (  194 lb 7.1 oz), SpO2 94.00%.   Labs: Recent Labs  Basename 08/30/11 0538 08/29/11 0518   WBC 12.2* 9.1   HGB 13.8 13.5   HCT 41.2 41.1   MCV 86.9 86.9   PLT 243 231   No results found for this basename: VITAMINB12,FOLATE,FERRITIN,TIBC,IRON,RETICCTPCT in the last 72 hours Recent Labs  [facility] 08/27/11 1920   DDIMER 0.28    Lab 08/28/11 0950 08/27/11 2100 08/27/11 1105  NA 137 -- 139  K 3.6 -- 4.2  CL 103 -- 105  CO2 25 -- 26  BUN 10 -- 11  CREATININE 0.81 0.88 0.98  CALCIUM 8.8 -- 9.8  PROT 6.5 -- --  BILITOT 0.3 -- --  ALKPHOS 170* -- --  ALT 21 -- --  AST 20 -- --  AMYLASE -- -- --  LIPASE -- -- --  GLUCOSE 145* -- 105*   Recent Labs  Basename 08/27/11 1700   HGBA1C 6.1*   Recent Labs  [facility] 08/28/11 1740 08/28/11 1145 08/28/11 0950   CKTOTAL 49 48 49   CKMB 2.2 2.2 2.1   CKMBINDEX -- -- --   TROPONINI <0.30 <0.30 <0.30   No components found with this basename: POCBNP Recent Labs  Basename 08/28/11 0505   CHOL 127   HDL 31*   LDLCALC 58   TRIG 189*   CHOLHDL 4.1   LDLDIRECT --    Basename 08/27/11 1700  TSH 1.008  T4TOTAL --  T3FREE --  THYROIDAB --    Disposition:  Discharge Orders    Future Appointments: Provider: Department: Dept Phone: Center:   09/21/2011 1:00 PM Joni Reining, NP [facility] [phone] [id number]      Discharge Medications:  Current Discharge Medication List    START taking these medications   Details  aspirin 81 MG chewable tablet Chew 1 tablet (81 mg total) by mouth daily. Qty: 30 tablet, Refills: 3      CONTINUE these  medications which have CHANGED   Details  !! losartan (COZAAR) 50 MG tablet Take 1 tablet (50 mg total) by mouth daily. Qty: 30 tablet, Refills: 1    metoprolol tartrate (LOPRESSOR) 25 MG tablet Take 1 tablet (25 mg total) by mouth 2 (two) times daily. Qty: 60 tablet, Refills: 3     !! - Potential duplicate medications found. Please discuss with provider.    CONTINUE these medications which have NOT CHANGED   Details  !! losartan (COZAAR) 50 MG tablet Take 1 tablet (50 mg total) by mouth daily. Qty: 90 tablet, Refills: 0    Multiple Vitamin (MULTIVITAMIN PO) Take by mouth daily.      omeprazole (PRILOSEC) 40 MG capsule Take 40 mg by mouth daily.      simvastatin (ZOCOR) 40 MG tablet Take 1 tablet (40 mg total) by mouth at bedtime. Qty: 90 tablet, Refills: 0    nitroGLYCERIN (NITROSTAT) 0.4 MG SL tablet Place 0.4 mg under the tongue every 5 (five) minutes as needed. Chest pain     !! - Potential duplicate medications found. Please discuss with provider.    STOP taking these medications     aspirin EC 325 MG tablet Comments:  Reason for Stopping:       aspirin 325 MG tablet Comments:  Reason for Stopping:           Outstanding Labs/Studies: None reported  Duration of Discharge Encounter: 40 minutes including physician time.  Signed, R. Hurman Horn, PA-C 08/30/2011, 9:36 AM

## 2011-08-31 ENCOUNTER — Telehealth: Payer: Self-pay | Admitting: Cardiology

## 2011-09-01 ENCOUNTER — Telehealth: Payer: Self-pay | Admitting: Cardiology

## 2011-09-01 NOTE — Telephone Encounter (Signed)
See my note.  Patient called.  He is better.

## 2011-09-03 ENCOUNTER — Telehealth: Payer: Self-pay | Admitting: Cardiology

## 2011-09-03 NOTE — Telephone Encounter (Signed)
Called Dr. Margo Common, and also spoke with patient.  Dr. Margo Common will call patient, and have him back in to the office to be seen.  We talked about various studies.  I then spoke with the patient, and he is up and about and doing better.  He takes Tylenol PM at night and has had less shoulder and back pain.  He does still have some chest pain, not bad, at rest, which seems better when he is up and around.  Will continue to monitor status, and Dr. Margo Common will get back with [place] about his status.  He will call the patient for early follow up.  I informed patient.  TS

## 2011-09-21 ENCOUNTER — Ambulatory Visit: Payer: Medicare Other | Admitting: Adult Health

## 2011-09-25 ENCOUNTER — Encounter: Payer: Self-pay | Admitting: Adult Health

## 2011-09-25 ENCOUNTER — Ambulatory Visit (INDEPENDENT_AMBULATORY_CARE_PROVIDER_SITE_OTHER): Payer: Medicare Other | Admitting: Adult Health

## 2011-09-25 VITALS — BP 109/76 | HR 74 | Ht 70.0 in | Wt 200.0 lb

## 2011-09-25 DIAGNOSIS — I2510 Atherosclerotic heart disease of native coronary artery without angina pectoris: Secondary | ICD-10-CM

## 2011-09-25 DIAGNOSIS — I25811 Atherosclerosis of native coronary artery of transplanted heart without angina pectoris: Secondary | ICD-10-CM

## 2011-09-25 DIAGNOSIS — K219 Gastro-esophageal reflux disease without esophagitis: Secondary | ICD-10-CM

## 2011-09-25 MED ORDER — NITROGLYCERIN 0.4 MG SL SUBL: 0.4000 mg | SUBLINGUAL_TABLET | SUBLINGUAL | Status: DC | PRN

## 2011-09-25 MED ORDER — SIMVASTATIN 40 MG PO TABS: 40.0000 mg | ORAL_TABLET | Freq: Every day | ORAL | Status: DC

## 2011-09-25 MED ORDER — OMEPRAZOLE 40 MG PO CPDR: 40.0000 mg | DELAYED_RELEASE_CAPSULE | Freq: Every day | ORAL | Status: DC

## 2011-09-25 MED ORDER — LOSARTAN POTASSIUM 50 MG PO TABS: 50.0000 mg | ORAL_TABLET | Freq: Every day | ORAL | Status: DC

## 2011-09-25 MED ORDER — METOPROLOL SUCCINATE ER 25 MG PO TB24: 25.0000 mg | ORAL_TABLET | Freq: Every day | ORAL | Status: DC

## 2011-09-25 NOTE — Patient Instructions (Signed)
**Note De-Identified [name] [name] Obfuscation** Your physician has recommended you make the following change in your medication: start taking Toprol 25 mg daily and stop taking Lopressor  Your physician recommends that you schedule a follow-up appointment in: 6 months

## 2011-09-25 NOTE — Assessment & Plan Note (Signed)
He is given Rx for prilosec as he states it worked better for him than protonix. He is on ECASA. He will follow-up with Dr. Margo Common for further recommendations concerning GI evaluation with colonoscopy.

## 2011-09-25 NOTE — Assessment & Plan Note (Signed)
Cardiac cath completed on Aug 27, 2011 demonstrated patent stents in the LAD and Cx and diffuse non-obstructive disease elsewhere. He has had no recurrence of chest pain since being treated for transverse colon colitis. He is not short of breath. He has some complaints of dizziness when he moves from sitting to standing. Orthostatic BP's are not significant with Lying 117/ 75 HR 66, and standing 109/76 with HR of 74.  I have changed his lopressor 25 mg BID to metoprolol XL 25 mg at HS.  This should help with the symptoms. He is given instructions to be careful moving from sitting to standing position and to sit down immediately for severe dizziness.  I have given him refills on all of his cardiac medications. He will follow-up with Dr. Daleen Squibb in [place] in 6 months.

## 2011-09-25 NOTE — Progress Notes (Signed)
HPI: Mr. Dudash is a very pleasant 66 y/o patient of Dr. Daleen Squibb we are seeing post hospitalization for recurrent chest pain in December of 2012. He has a history of CAD with stents to the LAD and CX. Cardiac cath by Dr. Riley Kill showed continued patency of the LAD and CX stents, ostial disease of the OM3, and diagonal, diffuse plaque of the proximal LAD and CX after stent site with preserved LV fx.   Echo confirmed EF and also demonstrated diastolic dysfunction. He was discharged on Aug 30, 2011 but continued to have chest discomfort which now extended into his abdomen, with severe cramping, bloating and excruciating pain. He presented to [facility] on Jan 3 and had a CT scan completed of the abdomen.Findings suggested severe transverse colon colitis. He was placed on Cipro, Flagyl and protonix. He has begun to feel better and pain is no long severe, only some soreness. He is to follow-up with Dr. Margo Common next week, and see GI the following week to plan a colonoscopy. His only complaint is mild dizziness when he gets up suddenly.  Allergies  Allergen Reactions  . Codeine     Current Outpatient Prescriptions  Medication Sig Dispense Refill  . aspirin 81 MG chewable tablet Chew 1 tablet (81 mg total) by mouth daily.  30 tablet  3  . ciprofloxacin (CIPRO) 500 MG tablet Take 500 mg by mouth 2 (two) times daily.        Marland Kitchen losartan (COZAAR) 50 MG tablet Take 1 tablet (50 mg total) by mouth daily.  90 tablet  2  . Multiple Vitamin (MULTIVITAMIN PO) Take by mouth daily.        . nitroGLYCERIN (NITROSTAT) 0.4 MG SL tablet Place 1 tablet (0.4 mg total) under the tongue every 5 (five) minutes as needed. Chest pain  90 tablet  0  . simvastatin (ZOCOR) 40 MG tablet Take 1 tablet (40 mg total) by mouth at bedtime.  90 tablet  2  . DISCONTD: simvastatin (ZOCOR) 40 MG tablet Take 1 tablet (40 mg total) by mouth at bedtime.  90 tablet  0  . metoprolol succinate (TOPROL XL) 25 MG 24 hr tablet Take 1 tablet (25  mg total) by mouth at bedtime.  90 tablet  2  . omeprazole (PRILOSEC) 40 MG capsule Take 1 capsule (40 mg total) by mouth daily.  90 capsule  2    Past Medical History  Diagnosis Date  . Myocardial infarct     Past Surgical History  Procedure Date  . Back surgery   . Hernia repair   . Dental surgery   . Coronary angioplasty with stent placement   . Appendectomy   . Knee surgery     left knee replacement  . Carpal tunnel release     GNF:AOZHYQ of systems complete and found to be negative unless listed above PHYSICAL EXAM BP 109/76  Pulse 74  Ht 5\' 10"  (1.778 m)  Wt 200 lb (90.719 kg)  BMI 28.70 kg/m2  SpO2 95%  General: Well developed, well nourished, in no acute distress Head: Eyes PERRLA, No xanthomas.   Normal cephalic and atramatic  Lungs: Clear bilaterally to auscultation and percussion. Heart: HRRR S1 S2, without MRG.  Pulses are 2+ & equal.            No carotid bruit. No JVD.  No abdominal bruits. No femoral bruits. Abdomen: Bowel sounds are positive, abdomen soft and non-tender without masses or  Hernia's noted. Msk:  Back normal, normal gait. Normal strength and tone for age. Extremities: No clubbing, cyanosis or edema.  DP +1 Neuro: Alert and oriented X 3. Psych:  Good affect, responds appropriately   EKG:NSR rate of 65 bpm  ASSESSMENT AND PLAN

## 2012-06-04 ENCOUNTER — Other Ambulatory Visit: Payer: Self-pay | Admitting: Cardiology

## 2012-06-04 MED ORDER — METOPROLOL SUCCINATE ER 25 MG PO TB24: 25.0000 mg | ORAL_TABLET | Freq: Every day | ORAL | Status: DC

## 2012-06-04 MED ORDER — SIMVASTATIN 40 MG PO TABS: 40.0000 mg | ORAL_TABLET | Freq: Every day | ORAL | Status: DC

## 2012-06-04 MED ORDER — LOSARTAN POTASSIUM 50 MG PO TABS: 50.0000 mg | ORAL_TABLET | Freq: Every day | ORAL | Status: DC

## 2012-06-04 NOTE — Telephone Encounter (Signed)
Patient does not want extended release anymore.  Would like to go back to previous dose. / tg

## 2012-08-06 ENCOUNTER — Encounter: Payer: Self-pay | Admitting: Cardiology

## 2012-08-06 ENCOUNTER — Telehealth: Payer: Self-pay | Admitting: Cardiology

## 2012-08-06 ENCOUNTER — Ambulatory Visit (INDEPENDENT_AMBULATORY_CARE_PROVIDER_SITE_OTHER): Payer: Medicare Other | Admitting: Cardiology

## 2012-08-06 VITALS — BP 118/62 | HR 78 | Ht 70.0 in | Wt 209.0 lb

## 2012-08-06 DIAGNOSIS — E785 Hyperlipidemia, unspecified: Secondary | ICD-10-CM | POA: Insufficient documentation

## 2012-08-06 DIAGNOSIS — I10 Essential (primary) hypertension: Secondary | ICD-10-CM | POA: Insufficient documentation

## 2012-08-06 DIAGNOSIS — R079 Chest pain, unspecified: Secondary | ICD-10-CM

## 2012-08-06 DIAGNOSIS — I2510 Atherosclerotic heart disease of native coronary artery without angina pectoris: Secondary | ICD-10-CM

## 2012-08-06 MED ORDER — METOPROLOL TARTRATE 25 MG PO TABS: 25.0000 mg | ORAL_TABLET | Freq: Two times a day (BID) | ORAL | Status: DC

## 2012-08-06 MED ORDER — OMEPRAZOLE 40 MG PO CPDR: 40.0000 mg | DELAYED_RELEASE_CAPSULE | Freq: Every day | ORAL | Status: DC

## 2012-08-06 MED ORDER — NITROGLYCERIN 0.4 MG SL SUBL: 0.4000 mg | SUBLINGUAL_TABLET | SUBLINGUAL | Status: DC | PRN

## 2012-08-06 NOTE — Telephone Encounter (Signed)
Message left for a return call

## 2012-08-06 NOTE — Addendum Note (Signed)
Addended by: Derry Lory A on: 08/06/2012 10:08 AM   Modules accepted: Orders

## 2012-08-06 NOTE — Patient Instructions (Addendum)
Your physician recommends that you schedule a follow-up appointment in: ONE YEAR WITH TW  Your physician has recommended you make the following change in your medication:   1) STOP Metoprolol XL 25mg  one time daily 2) START Metoprolol  (NON XL) 25mg  twice daily  We will send the new prescriptions, refill on Nitro and Prilosec today

## 2012-08-06 NOTE — Telephone Encounter (Signed)
Patient's wife would like to speak with nurse regarding Metoprolol. / tg

## 2012-08-06 NOTE — Progress Notes (Signed)
HPI [name] returns today for evaluation and management of his coronary artery disease, hyperlipidemia, hypertension and COPD. He denies any chest pain or shortness of breath and certainly no angina. His wife says he gets short of breath just cleaning up the shower. He denies this. He smoked for 40 years.  Denies orthopnea, PND or edema. He denies palpitations presyncope or syncope. He seems to be compliant with his medications. He is paying too much for generic Toprol. He wants to change this.  Past Medical History  Diagnosis Date  . Myocardial infarct     Current Outpatient Prescriptions  Medication Sig Dispense Refill  . aspirin 81 MG chewable tablet Chew 1 tablet (81 mg total) by mouth daily.  30 tablet  3  . ciprofloxacin (CIPRO) 500 MG tablet Take 500 mg by mouth 2 (two) times daily.        Marland Kitchen losartan (COZAAR) 50 MG tablet Take 1 tablet (50 mg total) by mouth daily.  90 tablet  2  . metoprolol succinate (TOPROL XL) 25 MG 24 hr tablet Take 1 tablet (25 mg total) by mouth at bedtime.  90 tablet  2  . Multiple Vitamin (MULTIVITAMIN PO) Take by mouth daily.        . nitroGLYCERIN (NITROSTAT) 0.4 MG SL tablet Place 1 tablet (0.4 mg total) under the tongue every 5 (five) minutes as needed. Chest pain  90 tablet  0  . omeprazole (PRILOSEC) 40 MG capsule Take 1 capsule (40 mg total) by mouth daily.  90 capsule  2  . simvastatin (ZOCOR) 40 MG tablet Take 1 tablet (40 mg total) by mouth at bedtime.  90 tablet  2    Allergies  Allergen Reactions  . Codeine     No family history on file.  History   Social History  . Marital Status: Married    Spouse Name: N/A    Number of Children: N/A  . Years of Education: N/A   Occupational History  . Not on file.   Social History Main Topics  . Smoking status: Former Games developer  . Smokeless tobacco: Not on file  . Alcohol Use: No  . Drug Use: No  . Sexually Active: Not on file   Other Topics Concern  . Not on file   Social History  Narrative  . No narrative on file    ROS ALL NEGATIVE EXCEPT THOSE NOTED IN HPI  PE  General Appearance: well developed, well nourished in no acute distress, overweight HEENT: symmetrical face, PERRLA, good dentition  Neck: no JVD, thyromegaly, or adenopathy, trachea midline Chest: symmetric without deformity Cardiac: PMI non-displaced, RRR, normal S1, S2, no gallop or murmur Lung: Good breath sounds throughout with no rhonchi or wheezes Vascular: all pulses full without bruits  Abdominal: nondistended, nontender, good bowel sounds, no HSM, no bruits Extremities: no cyanosis, clubbing or edema, no sign of DVT, no varicosities  Skin: normal color, no rashes Neuro: alert and oriented x 3, non-focal Pysch: normal affect  EKG Normal sinus rhythm, normal EKG  BMET    Component Value Date/Time   NA 137 08/28/2011 0950   K 3.6 08/28/2011 0950   CL 103 08/28/2011 0950   CO2 25 08/28/2011 0950   GLUCOSE 145* 08/28/2011 0950   BUN 10 08/28/2011 0950   CREATININE 0.81 08/28/2011 0950   CALCIUM 8.8 08/28/2011 0950   GFRNONAA 90* 08/28/2011 0950   GFRAA >90 08/28/2011 0950    Lipid Panel     Component Value Date/Time  CHOL 127 08/28/2011 0505   TRIG 189* 08/28/2011 0505   HDL 31* 08/28/2011 0505   CHOLHDL 4.1 08/28/2011 0505   VLDL 38 08/28/2011 0505   LDLCALC 58 08/28/2011 0505    CBC    Component Value Date/Time   WBC 12.2* 08/30/2011 0538   RBC 4.74 08/30/2011 0538   HGB 13.8 08/30/2011 0538   HCT 41.2 08/30/2011 0538   PLT 243 08/30/2011 0538   MCV 86.9 08/30/2011 0538   MCH 29.1 08/30/2011 0538   MCHC 33.5 08/30/2011 0538   RDW 12.9 08/30/2011 0538

## 2012-08-06 NOTE — Assessment & Plan Note (Signed)
Stable. Continue secondary preventative therapy. Patient changed to metoprolol tartrate for affordability. He will discuss with his pharmacy. Nitroglycerin refilled as well.

## 2013-02-20 NOTE — Telephone Encounter (Signed)
See notes

## 2013-02-23 ENCOUNTER — Other Ambulatory Visit: Payer: Self-pay | Admitting: Cardiology

## 2013-08-12 ENCOUNTER — Other Ambulatory Visit: Payer: Self-pay | Admitting: Cardiology

## 2013-09-04 ENCOUNTER — Encounter: Payer: Self-pay | Admitting: *Deleted

## 2013-09-04 ENCOUNTER — Telehealth: Payer: Self-pay | Admitting: *Deleted

## 2013-09-04 ENCOUNTER — Encounter: Payer: Self-pay | Admitting: Cardiology

## 2013-09-04 ENCOUNTER — Ambulatory Visit (INDEPENDENT_AMBULATORY_CARE_PROVIDER_SITE_OTHER): Payer: Medicare Other | Admitting: Cardiology

## 2013-09-04 VITALS — BP 141/74 | HR 63 | Ht 70.0 in | Wt 209.0 lb

## 2013-09-04 DIAGNOSIS — R0600 Dyspnea, unspecified: Secondary | ICD-10-CM

## 2013-09-04 DIAGNOSIS — R0609 Other forms of dyspnea: Secondary | ICD-10-CM

## 2013-09-04 DIAGNOSIS — I10 Essential (primary) hypertension: Secondary | ICD-10-CM

## 2013-09-04 DIAGNOSIS — Z01810 Encounter for preprocedural cardiovascular examination: Secondary | ICD-10-CM

## 2013-09-04 DIAGNOSIS — I2510 Atherosclerotic heart disease of native coronary artery without angina pectoris: Secondary | ICD-10-CM

## 2013-09-04 NOTE — Telephone Encounter (Signed)
Pt came in clinic for apt with Dr Wyline Mood pt noted he carried a card with information about his stent placements in 2004 and no one ever asked for it so he took it out of his wallet and can no longer find it, he was asked recently about this card by a MRI tech, pt wanting to know how he can get a card advised that I will have to research this and get back with them, pt understood

## 2013-09-04 NOTE — Telephone Encounter (Signed)
Message sent to our pacemaker nurse to advise if there is anyway to get the card to the pt, will advise pt once received answer

## 2013-09-04 NOTE — Patient Instructions (Addendum)
Your physician recommends that you schedule a follow-up appointment in: MONTH  Your physician has requested that you have a lexiscan myoview. For further information please visit [email or web address] Please follow instruction sheet, as given.  WE WILL CALL YOU WITH YOUR TEST RESULTS/INSTRUCTIONS/NEXT STEPS ONCE RECEIVED BY THE PROVIDER  PLEASE BE ADVISED YOU WILL STILL NEED TO KEEP YOUR FOLLOW UP APPOINTMENT TO DISCUSS FURTHER DETAILS OF YOUR TEST RESULTS/NEXT STEPS/FUTURE PLAN OF CARE WITH YOUR PROVIDER DESPITE THE FACT THAT YOU MAY HAVE NORMAL TEST RESULTS

## 2013-09-04 NOTE — Progress Notes (Signed)
Clinical Summary Mr. Zelek is a 67 y.o.male former patient of Dr Daleen Squibb, this is our first visit together. He was seen for the following medical problems.  1. CAD - prior STEMI in 2004, received stent to LAD and LCX - 08/2011 Echo LVEF 50-55% - denies any chest pain. Describes some SOB and DOE with activities. SOB at walking up one flight of stairs which is stable.  - no orthopnea, no PND, no LE edema - compliant with meds: ASA, metorpolol, losartan, simva  2. Hyperlipidemia - compliant with zocor, no recent panel in our system.  - followed by PCP Dr Margo Common  3. HTN - compliant with meds - does not check bp regularly  4. Dizziness - describes some dizziness with sitting up or standing quickly. Lasts for just a few seconds. Has meclizine but has not had to take.   5. Knee replacement - highest level of activity  Past Medical History  Diagnosis Date  . Myocardial infarct      Allergies  Allergen Reactions  . Codeine      Current Outpatient Prescriptions  Medication Sig Dispense Refill  . ciprofloxacin (CIPRO) 500 MG tablet Take 500 mg by mouth 2 (two) times daily.        Marland Kitchen losartan (COZAAR) 50 MG tablet TAKE ONE TABLET BY MOUTH DAILY  90 tablet  2  . metoprolol tartrate (LOPRESSOR) 25 MG tablet TAKE ONE TABLET BY MOUTH TWICE DAILY  60 tablet  0  . Multiple Vitamin (MULTIVITAMIN PO) Take by mouth daily.        . nitroGLYCERIN (NITROSTAT) 0.4 MG SL tablet Place 1 tablet (0.4 mg total) under the tongue every 5 (five) minutes as needed. Chest pain  90 tablet  1  . omeprazole (PRILOSEC) 40 MG capsule Take 1 capsule (40 mg total) by mouth daily.  90 capsule  2  . simvastatin (ZOCOR) 40 MG tablet TAKE ONE TABLET BY MOUTH AT BEDTIME  90 tablet  2   No current facility-administered medications for this visit.     Past Surgical History  Procedure Laterality Date  . Back surgery    . Hernia repair    . Dental surgery    . Coronary angioplasty with stent placement    .  Appendectomy    . Knee surgery      left knee replacement  . Carpal tunnel release       Allergies  Allergen Reactions  . Codeine       No family history on file.   Social History Mr. Witter reports that he has quit smoking. He does not have any smokeless tobacco history on file. Mr. Bozzi reports that he does not drink alcohol.   Review of Systems CONSTITUTIONAL: No weight loss, fever, chills, weakness or fatigue.  HEENT: Eyes: No visual loss, blurred vision, double vision or yellow sclerae.No hearing loss, sneezing, congestion, runny nose or sore throat.  SKIN: No rash or itching.  CARDIOVASCULAR: per HPI RESPIRATORY: No shortness of breath, cough or sputum.  GASTROINTESTINAL: No anorexia, nausea, vomiting or diarrhea. No abdominal pain or blood.  GENITOURINARY: No burning on urination, no polyuria NEUROLOGICAL: No headache, dizziness, syncope, paralysis, ataxia, numbness or tingling in the extremities. No change in bowel or bladder control.  MUSCULOSKELETAL: chronic knee pain.  LYMPHATICS: No enlarged nodes. No history of splenectomy.  PSYCHIATRIC: No history of depression or anxiety.  ENDOCRINOLOGIC: No reports of sweating, cold or heat intolerance. No polyuria or polydipsia.  Marland Kitchen  Physical Examination p 63 bp 141/74 Wt 209 lbs BMI 30 Gen: resting comfortably, no acute distress HEENT: no scleral icterus, pupils equal round and reactive, no palptable cervical adenopathy,  CV: RRR, no m/r/g, no JVD, no carotid bruits Resp: Clear to auscultation bilaterally GI: abdomen is soft, non-tender, non-distended, normal bowel sounds, no hepatosplenomegaly MSK: extremities are warm, no edema.  Skin: warm, no rash Neuro:  no focal deficits Psych: appropriate affect   Diagnostic Studies 08/2003 Cath FINDINGS:  1. Left main trunk: Large caliber vessel with mild irregularities.  2. LAD: This begins as a large caliber vessel and tapers in the distal  section. Provides two  diagonal branches. The proximal LAD has mild  disease of 30%. The mid LAD has eccentric plaque of 50%. The distal LAD  has a high grade narrowing of 95% medially after the second diagonal  branch with TIMI 2 flow distally. The first diagonal branch has an  ostial narrowing of 50-60%. The second diagonal branch has an ostial  narrowing of 30-40% and is a large caliber vessel.  3. Left circumflex artery: This is a medium caliber vessel. Provides  trivial first marginal branch in the mid section and two marginal  branches distally. The proximal AV circumflex has high grade narrowing  of 70-80%. There is then further narrowing of 50-60% after the first  marginal branch. The first marginal branch has long tubular narrowing of  50% in the proximal segment. The second marginal branch has an ostial  narrowing of 80%. The third marginal branch has proximal narrowing of  70%.  4. Right coronary artery: Dominant. This is a large caliber vessel.  Provides the posterior descending artery and posterior ventricular branch  in terminal segment. The right coronary artery has diffuse disease of 30-  40% in the mid and distal section.  5. LV: Normal end-systolic and end-diastolic dimensions. Overall left  ventricular function is well preserved. Ejection fraction greater than  55%. No mitral regurgitation. There is akinesis of a small portion of  the mid anterior wall. LV pressure is 110/10. Aortic is 110/60. LVEDP  equals 15.  DESCRIPTION OF PROCEDURE: With these findings we elected to proceed with  percutaneous intervention to the distal LAD. The patient was given heparin  systemically as well as Reopro to maintain an ACT of approximately 300  seconds using 600 mg of Plavix. During the case he developed some itching  and was given Benadryl and Solu-Medrol with improvement of this. A 6-French  Q4 guide catheter was used to engage the left coronary artery and a 0.014  inch Asahi Prowater wire introduced.  This was used to cross the stenosis  and position the distal LAD. A 2.5 x 15 mm Voyager balloon was introduced  and used to dilate the distal LAD lesion at 6 atmospheres for 120 seconds.  Repeat angiography showed an improvement in vessel lumen. However, there  was severe residual plaque and it was felt that further intervention would  be required. We introduced a 2.75 x 15 mm cutting balloon and a total of  six inflations were performed within the distal LAD at up to 10 atmospheres  for 60 seconds. Repeat angiography showed modest improvement in vessel  lumen. However, in the LAO cranial views there still appeared to be  residual narrowing of 70-80% at the origin of the stenosis. Some plaque  shifting had occurred into the diagonal branch as well. While we tried to  avoid stent placement, this became unavoidable. We positioned a  2.5 x 18 mm  Cypher stent into the distal LAD and deployed this at 10 atmospheres for 30  seconds. A 2.75 x 15 mm Quantum Maverick balloon was then used to post  dilate the stent. Two inflations were performed up to 14 atmospheres for 30  seconds each. Repeat angiography was then performed after the  administration of intracoronary nitroglycerin showing excellent result with  no residual stenosis in the distal LAD and improvement of TIMI grade 2 to  TIMI grade 3 flow in the distal LAD. There was some plaque shift to the  origin of the second diagonal branch of 70-80%. However, there was still  TIMI 3 flow in this vessel. We decided to accept this result. Final  angiography was performed in various projections showing no distal vessel  damage. The guide catheter was then removed. An AngioSeal closure device  was then deployed to the right femoral artery due to mild oozing around the  sheath. There was persistence of oozing and a FemStop was positioned until  hemostasis was achieved. The patient then transferred to the CCU in stable  condition. He tolerated the  procedure well.  FINAL RESULTS: Successful PTCA and stent placement in the distal LAD with  reduction of 95% narrowing to 0% with placement of a 2.5 x 18 mm Cypher drug-  eluting stent dilated to 2.75 mm and improvement of TIMI grade 2 to TIMI  grade 3 flow.  08/2011 Echo LVEF 50-55%, grade I diastolic dysfunction, difficult study.   09/04/13 EKG Normal sinus rhythm, no ischemic changes  Assessment and Plan  1. CAD - no current symptoms - continue secondary prevention  2. Hyperlipidemia - followed by his PCP Dr Margo Common, do not have recent panel in our system - consider changing to high dose statin (atorva 80 or crestor 20) in setting of known CAD, as suggested by most recent guidelines.  3. HTN - at goal, continue current meds  4. Dizziness - being treated for vertigo. Describes some symptoms suggestive of orthostatic dizziness. Reports does drink many fluids daily, encouraged increased hydration  5. Knee replacement pre op eval - patient is being considered for an elective intermediate risk surgery. He has no actively acute cardiac conditions. I am not able to accurately assess his functional capacity by history because he is physically limited by knee pain. He does describe some DOE with mild levels of activity - will obtain lexiscan MPI to further evaluate and risk stratify.     Antoine Poche, M.D., F.A.C.C.

## 2013-09-07 NOTE — Telephone Encounter (Signed)
Incoming message from pacemaker nurse via staff message as follows:  [name] Dales, LPN            I would have the patient call the 800# for whatever company made the stent and they can replace the card     Noted pt chart for stent documentation as follows: A 3.0 x 8-mm Cypher drug-eluting stent this nurse then looked into the cypher website and noted the only contact number noted was for the FDA, contacted our cath lab to discuss and was advised by cath rep Rob that the card is given to the pt the day of the surgery per the plastic card comes with the device inserted itself and is packaged with the device therefore there is no way to get the exact card again and the report he was given by the MRI tech will be efficient enough, called the pt to advise, pt understood

## 2013-09-22 ENCOUNTER — Other Ambulatory Visit (HOSPITAL_COMMUNITY): Payer: Medicare Other

## 2013-09-22 ENCOUNTER — Encounter (HOSPITAL_COMMUNITY): Admission: RE | Admit: 2013-09-22 | Payer: Medicare Other | Source: Ambulatory Visit

## 2013-09-22 ENCOUNTER — Encounter (HOSPITAL_COMMUNITY): Payer: Medicare Other

## 2013-10-06 ENCOUNTER — Encounter (HOSPITAL_COMMUNITY): Payer: Self-pay

## 2013-10-06 ENCOUNTER — Encounter (HOSPITAL_COMMUNITY)
Admission: RE | Admit: 2013-10-06 | Discharge: 2013-10-06 | Disposition: A | Discharge status: Home / Self Care | Payer: Medicare Other | Source: Ambulatory Visit | Attending: Cardiology | Admitting: Cardiology

## 2013-10-06 DIAGNOSIS — Z01810 Encounter for preprocedural cardiovascular examination: Secondary | ICD-10-CM | POA: Insufficient documentation

## 2013-10-06 DIAGNOSIS — I259 Chronic ischemic heart disease, unspecified: Secondary | ICD-10-CM | POA: Insufficient documentation

## 2013-10-06 DIAGNOSIS — R0600 Dyspnea, unspecified: Secondary | ICD-10-CM

## 2013-10-06 DIAGNOSIS — R0989 Other specified symptoms and signs involving the circulatory and respiratory systems: Secondary | ICD-10-CM

## 2013-10-06 DIAGNOSIS — R0609 Other forms of dyspnea: Secondary | ICD-10-CM

## 2013-10-06 DIAGNOSIS — I2510 Atherosclerotic heart disease of native coronary artery without angina pectoris: Secondary | ICD-10-CM | POA: Insufficient documentation

## 2013-10-06 DIAGNOSIS — I10 Essential (primary) hypertension: Secondary | ICD-10-CM | POA: Insufficient documentation

## 2013-10-06 DIAGNOSIS — E785 Hyperlipidemia, unspecified: Secondary | ICD-10-CM | POA: Insufficient documentation

## 2013-10-06 MED ORDER — TECHNETIUM TC 99M SESTAMIBI - CARDIOLITE
30.0000 | Freq: Once | INTRAVENOUS | Status: AC | PRN
  Administered 2013-10-06: 10:00:00 30 via INTRAVENOUS

## 2013-10-06 MED ORDER — TECHNETIUM TC 99M SESTAMIBI - CARDIOLITE
30.0000 | Freq: Once | INTRAVENOUS | Status: DC | PRN
Start: 2013-10-06 — End: 2013-10-12

## 2013-10-06 MED ORDER — TECHNETIUM TC 99M SESTAMIBI GENERIC - CARDIOLITE
10.0000 | Freq: Once | INTRAVENOUS | Status: AC | PRN
  Administered 2013-10-06: 10 via INTRAVENOUS

## 2013-10-06 MED ORDER — SODIUM CHLORIDE 0.9 % IJ SOLN
INTRAMUSCULAR | Status: AC
  Administered 2013-10-06: 10 mL via INTRAVENOUS
  Filled 2013-10-06: qty 10

## 2013-10-06 MED ORDER — REGADENOSON 0.4 MG/5ML IV SOLN
INTRAVENOUS | Status: AC
  Administered 2013-10-06: 0.4 mg via INTRAVENOUS
  Filled 2013-10-06: qty 5

## 2013-10-06 NOTE — Progress Notes (Signed)
Stress Lab Nurses Notes - Jacinto Keil 10/06/2013 Reason for doing test: CAD and Surgical Clearance Type of test: Wille Glaser Nurse performing test: Gerrit Halls, RN Nuclear Medicine Tech: Madelaine Etienne Echo Tech: Not Applicable MD performing test: Myles Gip Family MD: Dr. Scotty Court Test explained and consent signed: yes IV started: 22g jelco, Saline lock flushed, No redness or edema and Saline lock started in radiology Symptoms: Stomach pressure Treatment/Intervention: None Reason test stopped: protocol completed After recovery IV was: Discontinued via X-ray tech and No redness or edema Patient to return to [place]. Med at : 10:15 Patient discharged: Home Patient's Condition upon discharge was: stable Comments: During test BP 158/89 & HR 108. Recovery BP 152/87 & HR 74.  Symptoms resolved in recovery. Geanie Cooley T

## 2013-10-14 ENCOUNTER — Encounter: Payer: Self-pay | Admitting: Cardiology

## 2013-10-14 ENCOUNTER — Ambulatory Visit (INDEPENDENT_AMBULATORY_CARE_PROVIDER_SITE_OTHER): Payer: Medicare Other | Admitting: Cardiology

## 2013-10-14 VITALS — BP 153/66 | HR 68 | Ht 70.0 in | Wt 212.0 lb

## 2013-10-14 DIAGNOSIS — I2510 Atherosclerotic heart disease of native coronary artery without angina pectoris: Secondary | ICD-10-CM

## 2013-10-14 DIAGNOSIS — Z01810 Encounter for preprocedural cardiovascular examination: Secondary | ICD-10-CM

## 2013-10-14 MED ORDER — METOPROLOL TARTRATE 25 MG PO TABS: 12.5000 mg | ORAL_TABLET | Freq: Two times a day (BID) | ORAL | Status: DC

## 2013-10-14 NOTE — Patient Instructions (Signed)
Your physician wants you to follow-up in: 6 MONTHS You will receive a reminder letter in the mail two months in advance. If you don't receive a letter, please call our office to schedule the follow-up appointment. 

## 2013-10-14 NOTE — Progress Notes (Signed)
Clinical Summary [name] is a 68 y.o.male seen today for follow up.   1. CAD  - prior STEMI in 2004, received stent to LAD and LCX  - 08/2011 Echo LVEF 50-55%  - denies any chest pain. Describes some SOB and DOE with activities. SOB at walking up one flight of stairs which is stable.  - no orthopnea, no PND, no LE edema  - compliant with meds: ASA, metorpolol, losartan, simva  - since last visit, in setting of some SOB/DOE with activities and planned elective surgery we obtained a Lexiscan which showed no ischemia, low risk.    Past Medical History  Diagnosis Date  . Myocardial infarct      Allergies  Allergen Reactions  . Codeine      Current Outpatient Prescriptions  Medication Sig Dispense Refill  . alendronate (FOSAMAX) 10 MG tablet       . aspirin 81 MG tablet Take 81 mg by mouth daily.      . Cholecalciferol (VITAMIN D3) 400 UNIT/ML LIQD Take by mouth daily.      Marland Kitchen losartan (COZAAR) 50 MG tablet TAKE ONE TABLET BY MOUTH DAILY  90 tablet  2  . metoprolol tartrate (LOPRESSOR) 25 MG tablet Take 12.5 mg by mouth 2 (two) times daily.      . Multiple Vitamin (MULTIVITAMIN PO) Take by mouth daily.        . nitroGLYCERIN (NITROSTAT) 0.4 MG SL tablet Place 1 tablet (0.4 mg total) under the tongue every 5 (five) minutes as needed. Chest pain  90 tablet  1  . omeprazole (PRILOSEC) 40 MG capsule Take 1 capsule (40 mg total) by mouth daily.  90 capsule  2  . simvastatin (ZOCOR) 40 MG tablet TAKE ONE TABLET BY MOUTH AT BEDTIME  90 tablet  2   No current facility-administered medications for this visit.     Past Surgical History  Procedure Laterality Date  . Back surgery    . Hernia repair    . Dental surgery    . Coronary angioplasty with stent placement    . Appendectomy    . Knee surgery      left knee replacement  . Carpal tunnel release       Allergies  Allergen Reactions  . Codeine       No family history on file.   Social History [name] [name]  reports that he has quit smoking. He does not have any smokeless tobacco history on file. [name] [name] reports that he does not drink alcohol.   Review of Systems CONSTITUTIONAL: No weight loss, fever, chills, weakness or fatigue.  HEENT: Eyes: No visual loss, blurred vision, double vision or yellow sclerae.No hearing loss, sneezing, congestion, runny nose or sore throat.  SKIN: No rash or itching.  CARDIOVASCULAR:  RESPIRATORY: No shortness of breath, cough or sputum.  GASTROINTESTINAL: No anorexia, nausea, vomiting or diarrhea. No abdominal pain or blood.  GENITOURINARY: No burning on urination, no polyuria NEUROLOGICAL: No headache, dizziness, syncope, paralysis, ataxia, numbness or tingling in the extremities. No change in bowel or bladder control.  MUSCULOSKELETAL: No muscle, back pain, joint pain or stiffness.  LYMPHATICS: No enlarged nodes. No history of splenectomy.  PSYCHIATRIC: No history of depression or anxiety.  ENDOCRINOLOGIC: No reports of sweating, cold or heat intolerance. No polyuria or polydipsia.  Marland Kitchen   Physical Examination There were no vitals filed for this visit. Filed Weights   10/14/13 1356  Weight: 212 lb (96.163 kg)  Gen: resting comfortably, no acute distress HEENT: no scleral icterus, pupils equal round and reactive, no palptable cervical adenopathy,  CV Resp: Clear to auscultation bilaterally GI: abdomen is soft, non-tender, non-distended, normal bowel sounds, no hepatosplenomegaly MSK: extremities are warm, no edema.  Skin: warm, no rash Neuro:  no focal deficits Psych: appropriate affect   Diagnostic Studies 08/2003 Cath  FINDINGS:  1. Left main trunk: Large caliber vessel with mild irregularities.  2. LAD: This begins as a large caliber vessel and tapers in the distal  section. Provides two diagonal branches. The proximal LAD has mild  disease of 30%. The mid LAD has eccentric plaque of 50%. The distal LAD  has a high grade narrowing of 95%  medially after the second diagonal  [name] [name] with TIMI 2 flow distally. The first diagonal [name] [name] has an  ostial narrowing of 50-60%. The second diagonal Torrey Horseman has an ostial  narrowing of 30-40% and is a large caliber vessel.  3. Left circumflex artery: This is a medium caliber vessel. Provides  trivial first marginal [name] [name] in the mid section and two marginal  branches distally. The proximal AV circumflex has high grade narrowing  of 70-80%. There is then further narrowing of 50-60% after the first  marginal [name] [name]. The first marginal [name] [name] has long tubular narrowing of  50% in the proximal segment. The second marginal [name] [name] has an ostial  narrowing of 80%. The third marginal [name] [name] has proximal narrowing of  70%.  4. Right coronary artery: Dominant. This is a large caliber vessel.  Provides the posterior descending artery and posterior ventricular [name] [name]  in terminal segment. The right coronary artery has diffuse disease of 30-  40% in the mid and distal section.  5. LV: Normal end-systolic and end-diastolic dimensions. Overall left  ventricular function is well preserved. Ejection fraction greater than  55%. No mitral regurgitation. There is akinesis of a small portion of  the mid anterior wall. LV pressure is 110/10. Aortic is 110/60. LVEDP  equals 15.  DESCRIPTION OF PROCEDURE: With these findings we elected to proceed with  percutaneous intervention to the distal LAD. The patient was given heparin  systemically as well as Reopro to maintain an ACT of approximately 300  seconds using 600 mg of Plavix. During the case he developed some itching  and was given Benadryl and Solu-Medrol with improvement of this. A 6-French  Q4 guide catheter was used to engage the left coronary artery and a 0.014  inch Asahi Prowater wire introduced. This was used to cross the stenosis  and position the distal LAD. A 2.5 x 15 mm Voyager balloon was introduced  and used to dilate the distal LAD lesion at  6 atmospheres for 120 seconds.  Repeat angiography showed an improvement in vessel lumen. However, there  was severe residual plaque and it was felt that further intervention would  be required. We introduced a 2.75 x 15 mm cutting balloon and a total of  six inflations were performed within the distal LAD at up to 10 atmospheres  for 60 seconds. Repeat angiography showed modest improvement in vessel  lumen. However, in the LAO cranial views there still appeared to be  residual narrowing of 70-80% at the origin of the stenosis. Some plaque  shifting had occurred into the diagonal Gatha Mcnulty as well. While we tried to  avoid stent placement, this became unavoidable. We positioned a 2.5 x 18 mm  Cypher stent into the distal LAD and deployed this at 10 atmospheres for  30  seconds. A 2.75 x 15 mm Quantum Maverick balloon was then used to post  dilate the stent. Two inflations were performed up to 14 atmospheres for 30  seconds each. Repeat angiography was then performed after the  administration of intracoronary nitroglycerin showing excellent result with  no residual stenosis in the distal LAD and improvement of TIMI grade 2 to  TIMI grade 3 flow in the distal LAD. There was some plaque shift to the  origin of the second diagonal Eddie Payette of 70-80%. However, there was still  TIMI 3 flow in this vessel. We decided to accept this result. Final  angiography was performed in various projections showing no distal vessel  damage. The guide catheter was then removed. An AngioSeal closure device  was then deployed to the right femoral artery due to mild oozing around the  sheath. There was persistence of oozing and a FemStop was positioned until  hemostasis was achieved. The patient then transferred to the CCU in stable  condition. He tolerated the procedure well.  FINAL RESULTS: Successful PTCA and stent placement in the distal LAD with  reduction of 95% narrowing to 0% with placement of a 2.5 x 18 mm  Cypher drug-  eluting stent dilated to 2.75 mm and improvement of TIMI grade 2 to TIMI  grade 3 flow.   08/2011 Echo  LVEF 99991111, grade I diastolic dysfunction, difficult study.   09/04/13 EKG  Normal sinus rhythm, no ischemic changes   08/2013 Stress MPI IMPRESSION: Low risk Lexiscan Cardiolite. No diagnostic ST segment changes were noted. Rare PACs observed. Perfusion imaging is consistent with diaphragmatic attenuation affecting the inferior wall, no clear evidence of scar or ischemia. LV volumes are normal and LVEF is 62% without wall motion abnormality.    Assessment and Plan  1. CAD  - no current symptoms, recent negative Lexiscan  - continue secondary prevention   2 . Knee replacement pre op eval  - patient is being considered for an elective intermediate risk surgery. He has no actively acute cardiac conditions. I was not able to accurately assess his functional capacity by history because he is physically limited by knee pain. A Lexiscan MPI shows no evidence of ischemia, and is low risk. Recommend proceeding with surgery as planned without any further cardiac testing or interventions. Ok to hold ASA as needed, continue other medications in the perioperative period including beta blocker.       Arnoldo Lenis, M.D., F.A.C.C.

## 2013-11-03 ENCOUNTER — Other Ambulatory Visit: Payer: Self-pay | Admitting: Orthopedic Surgery

## 2013-11-04 ENCOUNTER — Encounter (HOSPITAL_COMMUNITY): Payer: Self-pay | Admitting: Pharmacy Technician

## 2013-11-09 ENCOUNTER — Ambulatory Visit (HOSPITAL_COMMUNITY)
Admission: RE | Admit: 2013-11-09 | Discharge: 2013-11-09 | Disposition: A | Discharge status: Home / Self Care | Payer: Medicare Other | Source: Ambulatory Visit | Attending: Orthopedic Surgery | Admitting: Orthopedic Surgery

## 2013-11-09 ENCOUNTER — Encounter (HOSPITAL_COMMUNITY): Payer: Self-pay

## 2013-11-09 ENCOUNTER — Encounter (HOSPITAL_COMMUNITY)
Admission: RE | Admit: 2013-11-09 | Discharge: 2013-11-09 | Disposition: A | Discharge status: Home / Self Care | Payer: Medicare Other | Source: Ambulatory Visit | Attending: Orthopedic Surgery | Admitting: Orthopedic Surgery

## 2013-11-09 DIAGNOSIS — Z01818 Encounter for other preprocedural examination: Secondary | ICD-10-CM | POA: Insufficient documentation

## 2013-11-09 DIAGNOSIS — Z01812 Encounter for preprocedural laboratory examination: Secondary | ICD-10-CM | POA: Insufficient documentation

## 2013-11-09 DIAGNOSIS — I10 Essential (primary) hypertension: Secondary | ICD-10-CM | POA: Insufficient documentation

## 2013-11-09 HISTORY — DX: Shortness of breath: R06.02

## 2013-11-09 HISTORY — DX: Unspecified asthma, uncomplicated: J45.909

## 2013-11-09 HISTORY — DX: Unspecified osteoarthritis, unspecified site: M19.90

## 2013-11-09 HISTORY — DX: Personal history of colon polyps, unspecified: Z86.0100

## 2013-11-09 HISTORY — DX: Hyperlipidemia, unspecified: E78.5

## 2013-11-09 HISTORY — DX: Essential (primary) hypertension: I10

## 2013-11-09 HISTORY — DX: Personal history of colonic polyps: Z86.010

## 2013-11-09 HISTORY — DX: Dizziness and giddiness: R42

## 2013-11-09 HISTORY — DX: Pain in unspecified joint: M25.50

## 2013-11-09 HISTORY — DX: Diverticulosis of intestine, part unspecified, without perforation or abscess without bleeding: K57.90

## 2013-11-09 HISTORY — DX: Gastro-esophageal reflux disease without esophagitis: K21.9

## 2013-11-09 HISTORY — DX: Personal history of other diseases of the respiratory system: Z87.09

## 2013-11-09 HISTORY — DX: Chronic obstructive pulmonary disease, unspecified: J44.9

## 2013-11-09 HISTORY — DX: Anemia, unspecified: D64.9

## 2013-11-09 HISTORY — DX: Unspecified cataract: H26.9

## 2013-11-09 HISTORY — DX: Atherosclerotic heart disease of native coronary artery without angina pectoris: I25.10

## 2013-11-09 HISTORY — DX: Dorsalgia, unspecified: M54.9

## 2013-11-09 HISTORY — DX: Acute upper respiratory infection, unspecified: J06.9

## 2013-11-09 HISTORY — DX: Noninfective gastroenteritis and colitis, unspecified: K52.9

## 2013-11-09 HISTORY — DX: Osteitis deformans of unspecified bone: M88.9

## 2013-11-09 HISTORY — DX: Unspecified hemorrhoids: K64.9

## 2013-11-09 LAB — TYPE AND SCREEN
ABO/RH(D): O POS
Antibody Screen: NEGATIVE

## 2013-11-09 LAB — URINALYSIS, ROUTINE W REFLEX MICROSCOPIC
Bilirubin Urine: NEGATIVE
Glucose, UA: NEGATIVE mg/dL
Hgb urine dipstick: NEGATIVE
Ketones, ur: NEGATIVE mg/dL
LEUKOCYTES UA: NEGATIVE
Nitrite: NEGATIVE
PH: 6.5 (ref 5.0–8.0)
PROTEIN: NEGATIVE mg/dL
Specific Gravity, Urine: 1.021 (ref 1.005–1.030)
Urobilinogen, UA: 0.2 mg/dL (ref 0.0–1.0)

## 2013-11-09 LAB — BASIC METABOLIC PANEL
BUN: 10 mg/dL (ref 6–23)
CHLORIDE: 103 meq/L (ref 96–112)
CO2: 26 meq/L (ref 19–32)
Calcium: 9.4 mg/dL (ref 8.4–10.5)
Creatinine, Ser: 0.95 mg/dL (ref 0.50–1.35)
GFR calc Af Amer: >90 mL/min (ref >90)
GFR calc non Af Amer: 83 mL/min — ABNORMAL LOW (ref >90)
GLUCOSE: 123 mg/dL — AB (ref 70–99)
POTASSIUM: 4.4 meq/L (ref 3.7–5.3)
Sodium: 142 mEq/L (ref 137–147)

## 2013-11-09 LAB — PROTIME-INR
INR: 1.02 (ref 0.00–1.49)
Prothrombin Time: 13.2 seconds (ref 11.6–15.2)

## 2013-11-09 LAB — CBC WITH DIFFERENTIAL/PLATELET
BASOS PCT: 0 % (ref 0–1)
Basophils Absolute: 0 10*3/uL (ref 0.0–0.1)
EOS ABS: 0.5 10*3/uL (ref 0.0–0.7)
Eosinophils Relative: 5 % (ref 0–5)
HEMATOCRIT: 40.8 % (ref 39.0–52.0)
HEMOGLOBIN: 14.1 g/dL (ref 13.0–17.0)
Lymphocytes Relative: 41 % (ref 12–46)
Lymphs Abs: 4.3 10*3/uL — ABNORMAL HIGH (ref 0.7–4.0)
MCH: 29.6 pg (ref 26.0–34.0)
MCHC: 34.6 g/dL (ref 30.0–36.0)
MCV: 85.7 fL (ref 78.0–100.0)
MONO ABS: 0.7 10*3/uL (ref 0.1–1.0)
MONOS PCT: 7 % (ref 3–12)
Neutro Abs: 5 10*3/uL (ref 1.7–7.7)
Neutrophils Relative %: 48 % (ref 43–77)
Platelets: 282 10*3/uL (ref 150–400)
RBC: 4.76 MIL/uL (ref 4.22–5.81)
RDW: 13.5 % (ref 11.5–15.5)
WBC: 10.6 10*3/uL — ABNORMAL HIGH (ref 4.0–10.5)

## 2013-11-09 LAB — SURGICAL PCR SCREEN
MRSA, PCR: NEGATIVE
Staphylococcus aureus: NEGATIVE

## 2013-11-09 LAB — ABO/RH: ABO/RH(D): O POS

## 2013-11-09 LAB — APTT: aPTT: 31 seconds (ref 24–37)

## 2013-11-09 MED ORDER — CHLORHEXIDINE GLUCONATE 4 % EX LIQD: 60.0000 mL | Freq: Once | CUTANEOUS | Status: DC

## 2013-11-09 NOTE — Progress Notes (Addendum)
  Cardiologist is Dr.Branch with clearance note in epic    Echo report in epic from 2012  Stress test report in epic from 2011 and 2015  Multiple heart cath reports in epic with most recent 2012  EKG in epic from 08-25-13  Medical Md is Dr.Tapper in [place]   Denies CXR in past yr

## 2013-11-09 NOTE — Pre-Procedure Instructions (Signed)
[name] [name]  11/09/2013   Your procedure is scheduled on:  Mon, Mar 2 @ 7:30 AM  Report to [facility] Stay Entrance A  at 5:30 AM.  Call this number if you have problems the morning of surgery: [phone]   Remember:   Do not eat food or drink liquids after midnight.   Take these medicines the morning of surgery with A SIP OF WATER: Metoprolol(Lopressor) and Omeprazole(Prilosec)               Stop taking your Aspirin. No Goody's,BC's,Aleve,Ibuprofen,Fish Oil,or any Herbal Medications   Do not wear jewelry  Do not wear lotions, powders, or colognes. You may wear deodorant.  Men may shave face and neck.  Do not bring valuables to the hospital.  [facility] is not responsible                  for any belongings or valuables.               Contacts, dentures or bridgework may not be worn into surgery.  Leave suitcase in the car. After surgery it may be brought to your room.  For patients admitted to the hospital, discharge time is determined by your                treatment team.               Special Instructions:  [facility] - Preparing for Surgery  Before surgery, you can play an important role.  Because skin is not sterile, your skin needs to be as free of germs as possible.  You can reduce the number of germs on you skin by washing with CHG (chlorahexidine gluconate) soap before surgery.  CHG is an antiseptic cleaner which kills germs and bonds with the skin to continue killing germs even after washing.  Please DO NOT use if you have an allergy to CHG or antibacterial soaps.  If your skin becomes reddened/irritated stop using the CHG and inform your nurse when you arrive at Short Stay.  Do not shave (including legs and underarms) for at least 48 hours prior to the first CHG shower.  You may shave your face.  Please follow these instructions carefully:   1.  Shower with CHG Soap the night before surgery and the                                morning of Surgery.  2.  If  you choose to wash your hair, wash your hair first as usual with your       normal shampoo.  3.  After you shampoo, rinse your hair and body thoroughly to remove the                      Shampoo.  4.  Use CHG as you would any other liquid soap.  You can apply chg directly       to the skin and wash gently with scrungie or a clean washcloth.  5.  Apply the CHG Soap to your body ONLY FROM THE NECK DOWN.        Do not use on open wounds or open sores.  Avoid contact with your eyes,       ears, mouth and genitals (private parts).  Wash genitals (private parts)       with your normal soap.  6.  Wash thoroughly, paying special attention to the area where your surgery        will be performed.  7.  Thoroughly rinse your body with warm water from the neck down.  8.  DO NOT shower/wash with your normal soap after using and rinsing off       the CHG Soap.  9.  Pat yourself dry with a clean towel.            10.  Wear clean pajamas.            11.  Place clean sheets on your bed the night of your first shower and do not        sleep with pets.  Day of Surgery  Do not apply any lotions/deoderants the morning of surgery.  Please wear clean clothes to the hospital/surgery center.     Please read over the following fact sheets that you were given: Pain Booklet, Coughing and Deep Breathing, Blood Transfusion Information, MRSA Information and Surgical Site Infection Prevention

## 2013-11-13 NOTE — H&P (Signed)
TOTAL KNEE ADMISSION H&P  Patient is being admitted for right total knee arthroplasty.  Subjective:  Chief Complaint:right knee pain.  HPI: [name] [name], 68 y.o. male, has a history of pain and functional disability in the right knee due to arthritis and has failed non-surgical conservative treatments for greater than 12 weeks to includeNSAID's and/or analgesics, corticosteriod injections, flexibility and strengthening excercises, use of assistive devices and activity modification.  Onset of symptoms was gradual, starting several years ago with gradually worsening course since that time. The patient noted no past surgery on the right knee(s).  Patient currently rates pain in the right knee(s) at 10 out of 10 with activity. Patient has night pain, worsening of pain with activity and weight bearing, pain that interferes with activities of daily living, pain with passive range of motion and joint swelling.  Patient has evidence of joint space narrowing by imaging studies.  There is no active infection.  Patient Active Problem List   Diagnosis Date Noted  . Hyperlipidemia LDL goal < 100 08/06/2012  . Unspecified essential hypertension 08/06/2012  . Chest pain 08/29/2011  . CAD, NATIVE VESSEL 07/10/2010  . GERD 05/09/2009  . ARTHRITIS 05/09/2009   Past Medical History  Diagnosis Date  . Hypertension     takes Losartan and Metoprolol daily  . Hyperlipidemia     takes Simvastatin nightly  . GERD (gastroesophageal reflux disease)     takes Omeprazole daily  . Coronary artery disease   . Myocardial infarct 2004  . Shortness of breath   . Asthma     with exertion  . History of bronchitis     many yrs ago  . URI (upper respiratory infection)     Dec 31,2014  . Vertigo     takes Meclizine daily as needed  . Arthritis   . Joint pain   . Back pain   . Paget disease of bone     takes Fosamax daily  . Hemorrhoids   . Colitis     just finished one antibiotic and Cipro will be finished  up  . Diverticulosis   . History of colon polyps   . Anemia   . Cataracts, bilateral     immature  . COPD (chronic obstructive pulmonary disease)     no meds required    Past Surgical History  Procedure Laterality Date  . Dental surgery    . Appendectomy    . Carpal tunnel release Right   . Abdominal exploration surgery    . Hernia repair      inguinal  . Joint replacement Left   . Bilateral knee arthroscopies      x 3  . Back surgery      x 2  . Coronary angioplasty      3 stents  . Colonoscopy    . Esophagogastroduodenoscopy      No prescriptions prior to admission   Allergies  Allergen Reactions  . Codeine Nausea And Vomiting    History  Substance Use Topics  . Smoking status: Former Research scientist (life sciences)  . Smokeless tobacco: Not on file     Comment: quit smoking in 2001  . Alcohol Use: No    No family history on file.   Review of Systems  Constitutional: Negative.   HENT: Negative.   Eyes: Negative.   Respiratory: Negative.   Cardiovascular: Negative.   Gastrointestinal: Negative.   Musculoskeletal: Positive for joint pain.  Skin: Negative.   Neurological: Negative.   Psychiatric/Behavioral: Negative.  Objective:  Physical Exam  Constitutional: He is oriented to person, place, and time. He appears well-developed and well-nourished.  HENT:  Head: Normocephalic and atraumatic.  Eyes: Pupils are equal, round, and reactive to light.  Neck: Normal range of motion. Neck supple.  Cardiovascular: Intact distal pulses.   Respiratory: Effort normal.  Musculoskeletal: He exhibits tenderness.  Neurological: He is alert and oriented to person, place, and time.  Skin: Skin is warm and dry.  Psychiatric: He has a normal mood and affect. His behavior is normal. Judgment and thought content normal.    Vital signs in last 24 hours:    Labs:   Estimated body mass index is 30.42 kg/(m^2) as calculated from the following:   Height as of 10/14/13: 5\' 10"  (1.778 m).    Weight as of 10/14/13: 96.163 kg (212 lb).   Imaging Review Plain radiographs demonstrate severe degenerative joint disease of the right knee(s). The overall alignment ismild valgus. The bone quality appears to be good for age and reported activity level.  Assessment/Plan:  End stage arthritis, right knee   The patient history, physical examination, clinical judgment of the provider and imaging studies are consistent with end stage degenerative joint disease of the right knee(s) and total knee arthroplasty is deemed medically necessary. The treatment options including medical management, injection therapy arthroscopy and arthroplasty were discussed at length. The risks and benefits of total knee arthroplasty were presented and reviewed. The risks due to aseptic loosening, infection, stiffness, patella tracking problems, thromboembolic complications and other imponderables were discussed. The patient acknowledged the explanation, agreed to proceed with the plan and consent was signed. Patient is being admitted for inpatient treatment for surgery, pain control, PT, OT, prophylactic antibiotics, VTE prophylaxis, progressive ambulation and ADL's and discharge planning. The patient is planning to be discharged to skilled nursing facility

## 2013-11-15 MED ORDER — CEFAZOLIN SODIUM-DEXTROSE 2-3 GM-% IV SOLR
2.0000 g | INTRAVENOUS | Status: AC
  Administered 2013-11-16: 2 g via INTRAVENOUS
  Filled 2013-11-15: qty 50

## 2013-11-16 ENCOUNTER — Inpatient Hospital Stay (HOSPITAL_COMMUNITY)
Admission: RE | Admit: 2013-11-16 | Discharge: 2013-11-20 | DRG: 470 | Disposition: A | Discharge status: Home Health | Payer: Medicare Other | Source: Ambulatory Visit | Attending: Orthopedic Surgery | Admitting: Orthopedic Surgery

## 2013-11-16 ENCOUNTER — Inpatient Hospital Stay (HOSPITAL_COMMUNITY): Payer: Medicare Other | Admitting: Anesthesiology

## 2013-11-16 ENCOUNTER — Encounter (HOSPITAL_COMMUNITY): Payer: Self-pay | Admitting: Surgery

## 2013-11-16 ENCOUNTER — Encounter (HOSPITAL_COMMUNITY): Payer: Medicare Other | Admitting: Anesthesiology

## 2013-11-16 ENCOUNTER — Encounter (HOSPITAL_COMMUNITY)
Admission: RE | Disposition: A | Discharge status: Home Health | Payer: Self-pay | Source: Ambulatory Visit | Attending: Orthopedic Surgery

## 2013-11-16 DIAGNOSIS — I10 Essential (primary) hypertension: Secondary | ICD-10-CM | POA: Diagnosis present

## 2013-11-16 DIAGNOSIS — M889 Osteitis deformans of unspecified bone: Secondary | ICD-10-CM | POA: Diagnosis present

## 2013-11-16 DIAGNOSIS — R079 Chest pain, unspecified: Secondary | ICD-10-CM

## 2013-11-16 DIAGNOSIS — R0789 Other chest pain: Secondary | ICD-10-CM | POA: Diagnosis present

## 2013-11-16 DIAGNOSIS — I2510 Atherosclerotic heart disease of native coronary artery without angina pectoris: Secondary | ICD-10-CM | POA: Diagnosis present

## 2013-11-16 DIAGNOSIS — K219 Gastro-esophageal reflux disease without esophagitis: Secondary | ICD-10-CM | POA: Diagnosis present

## 2013-11-16 DIAGNOSIS — M1710 Unilateral primary osteoarthritis, unspecified knee: Principal | ICD-10-CM | POA: Diagnosis present

## 2013-11-16 DIAGNOSIS — I252 Old myocardial infarction: Secondary | ICD-10-CM

## 2013-11-16 DIAGNOSIS — Z9861 Coronary angioplasty status: Secondary | ICD-10-CM

## 2013-11-16 DIAGNOSIS — J449 Chronic obstructive pulmonary disease, unspecified: Secondary | ICD-10-CM | POA: Diagnosis present

## 2013-11-16 DIAGNOSIS — Z87891 Personal history of nicotine dependence: Secondary | ICD-10-CM

## 2013-11-16 DIAGNOSIS — M1711 Unilateral primary osteoarthritis, right knee: Secondary | ICD-10-CM | POA: Diagnosis present

## 2013-11-16 DIAGNOSIS — Z885 Allergy status to narcotic agent status: Secondary | ICD-10-CM

## 2013-11-16 DIAGNOSIS — E785 Hyperlipidemia, unspecified: Secondary | ICD-10-CM | POA: Diagnosis present

## 2013-11-16 DIAGNOSIS — J4489 Other specified chronic obstructive pulmonary disease: Secondary | ICD-10-CM | POA: Diagnosis present

## 2013-11-16 DIAGNOSIS — R1310 Dysphagia, unspecified: Secondary | ICD-10-CM | POA: Diagnosis present

## 2013-11-16 HISTORY — PX: TOTAL KNEE ARTHROPLASTY: SHX125

## 2013-11-16 SURGERY — ARTHROPLASTY, KNEE, TOTAL
Anesthesia: General | Site: Knee | Laterality: Right

## 2013-11-16 MED ORDER — MIDAZOLAM HCL 5 MG/5ML IJ SOLN
INTRAMUSCULAR | Status: DC | PRN
  Administered 2013-11-16: 2 mg via INTRAVENOUS

## 2013-11-16 MED ORDER — HYDROMORPHONE HCL PF 1 MG/ML IJ SOLN
1.0000 mg | INTRAMUSCULAR | Status: DC | PRN
  Administered 2013-11-16 – 2013-11-19 (×3): 1 mg via INTRAVENOUS
  Filled 2013-11-16 (×3): qty 1

## 2013-11-16 MED ORDER — ALUM & MAG HYDROXIDE-SIMETH 200-200-20 MG/5ML PO SUSP
30.0000 mL | ORAL | Status: DC | PRN
  Administered 2013-11-17 – 2013-11-19 (×3): 30 mL via ORAL
  Filled 2013-11-16 (×3): qty 30

## 2013-11-16 MED ORDER — OXYCODONE HCL 5 MG PO TABS
5.0000 mg | ORAL_TABLET | ORAL | Status: DC | PRN
  Administered 2013-11-16 – 2013-11-20 (×16): 10 mg via ORAL
  Filled 2013-11-16 (×15): qty 2

## 2013-11-16 MED ORDER — SUCCINYLCHOLINE CHLORIDE 20 MG/ML IJ SOLN
INTRAMUSCULAR | Status: AC
  Filled 2013-11-16: qty 1

## 2013-11-16 MED ORDER — MIDAZOLAM HCL 2 MG/2ML IJ SOLN
INTRAMUSCULAR | Status: AC
  Filled 2013-11-16: qty 2

## 2013-11-16 MED ORDER — METOCLOPRAMIDE HCL 5 MG PO TABS
5.0000 mg | ORAL_TABLET | Freq: Three times a day (TID) | ORAL | Status: DC | PRN
  Filled 2013-11-16: qty 2

## 2013-11-16 MED ORDER — PROMETHAZINE HCL 25 MG/ML IJ SOLN: 6.2500 mg | INTRAMUSCULAR | Status: DC | PRN

## 2013-11-16 MED ORDER — PROPOFOL 10 MG/ML IV BOLUS
INTRAVENOUS | Status: AC
  Filled 2013-11-16: qty 20

## 2013-11-16 MED ORDER — PROPOFOL 10 MG/ML IV BOLUS
INTRAVENOUS | Status: DC | PRN
  Administered 2013-11-16: 100 mg via INTRAVENOUS
  Administered 2013-11-16: 150 mg via INTRAVENOUS

## 2013-11-16 MED ORDER — NEOSTIGMINE METHYLSULFATE 1 MG/ML IJ SOLN
INTRAMUSCULAR | Status: AC
  Filled 2013-11-16: qty 10

## 2013-11-16 MED ORDER — METOPROLOL TARTRATE 12.5 MG HALF TABLET
12.5000 mg | ORAL_TABLET | Freq: Two times a day (BID) | ORAL | Status: DC
  Administered 2013-11-16 – 2013-11-20 (×8): 12.5 mg via ORAL
  Filled 2013-11-16 (×9): qty 1

## 2013-11-16 MED ORDER — ACETAMINOPHEN 650 MG RE SUPP: 650.0000 mg | Freq: Four times a day (QID) | RECTAL | Status: DC | PRN

## 2013-11-16 MED ORDER — FENTANYL CITRATE 0.05 MG/ML IJ SOLN
INTRAMUSCULAR | Status: AC
  Filled 2013-11-16: qty 5

## 2013-11-16 MED ORDER — ROCURONIUM BROMIDE 100 MG/10ML IV SOLN
INTRAVENOUS | Status: DC | PRN
  Administered 2013-11-16: 30 mg via INTRAVENOUS

## 2013-11-16 MED ORDER — HYDROMORPHONE HCL PF 1 MG/ML IJ SOLN
INTRAMUSCULAR | Status: AC
  Filled 2013-11-16: qty 2

## 2013-11-16 MED ORDER — ACETAMINOPHEN 325 MG PO TABS: 650.0000 mg | ORAL_TABLET | Freq: Four times a day (QID) | ORAL | Status: DC | PRN

## 2013-11-16 MED ORDER — LOSARTAN POTASSIUM 50 MG PO TABS
50.0000 mg | ORAL_TABLET | Freq: Every day | ORAL | Status: DC
  Administered 2013-11-16 – 2013-11-20 (×5): 50 mg via ORAL
  Filled 2013-11-16 (×5): qty 1

## 2013-11-16 MED ORDER — FENTANYL CITRATE 0.05 MG/ML IJ SOLN
INTRAMUSCULAR | Status: DC | PRN
  Administered 2013-11-16: 100 ug via INTRAVENOUS
  Administered 2013-11-16 (×4): 50 ug via INTRAVENOUS

## 2013-11-16 MED ORDER — OXYCODONE HCL 5 MG PO TABS
ORAL_TABLET | ORAL | Status: AC
  Filled 2013-11-16: qty 2

## 2013-11-16 MED ORDER — SODIUM CHLORIDE 0.9 % IJ SOLN
INTRAMUSCULAR | Status: AC
  Filled 2013-11-16: qty 12

## 2013-11-16 MED ORDER — SODIUM CHLORIDE 0.9 % IJ SOLN
INTRAMUSCULAR | Status: DC | PRN
  Administered 2013-11-16: 08:00:00

## 2013-11-16 MED ORDER — PHENYLEPHRINE 40 MCG/ML (10ML) SYRINGE FOR IV PUSH (FOR BLOOD PRESSURE SUPPORT)
PREFILLED_SYRINGE | INTRAVENOUS | Status: AC
  Filled 2013-11-16: qty 10

## 2013-11-16 MED ORDER — ONDANSETRON HCL 4 MG PO TABS: 4.0000 mg | ORAL_TABLET | Freq: Four times a day (QID) | ORAL | Status: DC | PRN

## 2013-11-16 MED ORDER — MAGNESIUM CITRATE PO SOLN: 1.0000 | Freq: Once | ORAL | Status: AC | PRN

## 2013-11-16 MED ORDER — LACTATED RINGERS IV SOLN
INTRAVENOUS | Status: DC | PRN
  Administered 2013-11-16 (×2): via INTRAVENOUS

## 2013-11-16 MED ORDER — METHOCARBAMOL 100 MG/ML IJ SOLN
500.0000 mg | Freq: Four times a day (QID) | INTRAMUSCULAR | Status: DC | PRN
  Filled 2013-11-16: qty 5

## 2013-11-16 MED ORDER — GLYCOPYRROLATE 0.2 MG/ML IJ SOLN
INTRAMUSCULAR | Status: DC | PRN
  Administered 2013-11-16: 0.4 mg via INTRAVENOUS

## 2013-11-16 MED ORDER — METHOCARBAMOL 500 MG PO TABS: 500.0000 mg | ORAL_TABLET | Freq: Two times a day (BID) | ORAL | Status: DC

## 2013-11-16 MED ORDER — SIMVASTATIN 40 MG PO TABS
40.0000 mg | ORAL_TABLET | Freq: Every day | ORAL | Status: DC
  Administered 2013-11-16 – 2013-11-19 (×4): 40 mg via ORAL
  Filled 2013-11-16 (×5): qty 1

## 2013-11-16 MED ORDER — MENTHOL 3 MG MT LOZG: 1.0000 | LOZENGE | OROMUCOSAL | Status: DC | PRN

## 2013-11-16 MED ORDER — LIDOCAINE HCL (CARDIAC) 20 MG/ML IV SOLN
INTRAVENOUS | Status: AC
  Filled 2013-11-16: qty 5

## 2013-11-16 MED ORDER — BISACODYL 5 MG PO TBEC: 5.0000 mg | DELAYED_RELEASE_TABLET | Freq: Every day | ORAL | Status: DC | PRN

## 2013-11-16 MED ORDER — CEFUROXIME SODIUM 1.5 G IJ SOLR
INTRAMUSCULAR | Status: DC | PRN
  Administered 2013-11-16: 1.5 g

## 2013-11-16 MED ORDER — SENNOSIDES-DOCUSATE SODIUM 8.6-50 MG PO TABS
1.0000 | ORAL_TABLET | Freq: Every evening | ORAL | Status: DC | PRN
Start: 2013-11-16 — End: 2013-11-20

## 2013-11-16 MED ORDER — ASPIRIN EC 325 MG PO TBEC: 325.0000 mg | DELAYED_RELEASE_TABLET | Freq: Two times a day (BID) | ORAL | Status: DC

## 2013-11-16 MED ORDER — NEOSTIGMINE METHYLSULFATE 1 MG/ML IJ SOLN
INTRAMUSCULAR | Status: DC | PRN
  Administered 2013-11-16: 3 mg via INTRAVENOUS

## 2013-11-16 MED ORDER — METOCLOPRAMIDE HCL 5 MG/ML IJ SOLN
5.0000 mg | Freq: Three times a day (TID) | INTRAMUSCULAR | Status: DC | PRN
  Administered 2013-11-16 – 2013-11-19 (×4): 10 mg via INTRAVENOUS
  Filled 2013-11-16 (×4): qty 2

## 2013-11-16 MED ORDER — OXYCODONE-ACETAMINOPHEN 5-325 MG PO TABS: 1.0000 | ORAL_TABLET | ORAL | Status: DC | PRN

## 2013-11-16 MED ORDER — SODIUM CHLORIDE 0.9 % IR SOLN
Status: DC | PRN
  Administered 2013-11-16: 1000 mL

## 2013-11-16 MED ORDER — ONDANSETRON HCL 4 MG/2ML IJ SOLN
4.0000 mg | Freq: Four times a day (QID) | INTRAMUSCULAR | Status: DC | PRN
  Administered 2013-11-16: 4 mg via INTRAVENOUS
  Filled 2013-11-16: qty 2

## 2013-11-16 MED ORDER — ASPIRIN EC 325 MG PO TBEC
325.0000 mg | DELAYED_RELEASE_TABLET | Freq: Every day | ORAL | Status: DC
  Administered 2013-11-17 – 2013-11-20 (×4): 325 mg via ORAL
  Filled 2013-11-16 (×5): qty 1

## 2013-11-16 MED ORDER — CEFUROXIME SODIUM 1.5 G IJ SOLR
INTRAMUSCULAR | Status: AC
  Filled 2013-11-16: qty 1.5

## 2013-11-16 MED ORDER — KCL IN DEXTROSE-NACL 20-5-0.45 MEQ/L-%-% IV SOLN
INTRAVENOUS | Status: DC
  Administered 2013-11-16: 12:00:00 via INTRAVENOUS
  Filled 2013-11-16 (×11): qty 1000

## 2013-11-16 MED ORDER — DOCUSATE SODIUM 100 MG PO CAPS
100.0000 mg | ORAL_CAPSULE | Freq: Two times a day (BID) | ORAL | Status: DC
  Administered 2013-11-16 – 2013-11-20 (×8): 100 mg via ORAL
  Filled 2013-11-16 (×9): qty 1

## 2013-11-16 MED ORDER — BUPIVACAINE LIPOSOME 1.3 % IJ SUSP
20.0000 mL | Freq: Once | INTRAMUSCULAR | Status: DC
  Filled 2013-11-16: qty 20

## 2013-11-16 MED ORDER — METHOCARBAMOL 500 MG PO TABS
ORAL_TABLET | ORAL | Status: AC
  Filled 2013-11-16: qty 1

## 2013-11-16 MED ORDER — DEXTROSE-NACL 5-0.45 % IV SOLN: INTRAVENOUS | Status: DC

## 2013-11-16 MED ORDER — ONDANSETRON HCL 4 MG/2ML IJ SOLN
INTRAMUSCULAR | Status: DC | PRN
  Administered 2013-11-16: 4 mg via INTRAVENOUS

## 2013-11-16 MED ORDER — PHENOL 1.4 % MT LIQD: 1.0000 | OROMUCOSAL | Status: DC | PRN

## 2013-11-16 MED ORDER — DIPHENHYDRAMINE HCL 12.5 MG/5ML PO ELIX
12.5000 mg | ORAL_SOLUTION | ORAL | Status: DC | PRN
  Administered 2013-11-16 – 2013-11-17 (×3): 25 mg via ORAL
  Filled 2013-11-16 (×3): qty 10

## 2013-11-16 MED ORDER — METHOCARBAMOL 500 MG PO TABS
500.0000 mg | ORAL_TABLET | Freq: Four times a day (QID) | ORAL | Status: DC | PRN
  Administered 2013-11-16 – 2013-11-20 (×8): 500 mg via ORAL
  Filled 2013-11-16 (×7): qty 1

## 2013-11-16 MED ORDER — PHENYLEPHRINE 40 MCG/ML (10ML) SYRINGE FOR IV PUSH (FOR BLOOD PRESSURE SUPPORT)
PREFILLED_SYRINGE | INTRAVENOUS | Status: AC
  Filled 2013-11-16: qty 20

## 2013-11-16 MED ORDER — NITROGLYCERIN 0.4 MG SL SUBL: 0.4000 mg | SUBLINGUAL_TABLET | SUBLINGUAL | Status: DC | PRN

## 2013-11-16 MED ORDER — PANTOPRAZOLE SODIUM 40 MG PO TBEC
80.0000 mg | DELAYED_RELEASE_TABLET | Freq: Every day | ORAL | Status: DC
  Administered 2013-11-17 – 2013-11-20 (×4): 80 mg via ORAL
  Filled 2013-11-16 (×4): qty 2

## 2013-11-16 MED ORDER — ROCURONIUM BROMIDE 50 MG/5ML IV SOLN
INTRAVENOUS | Status: AC
  Filled 2013-11-16: qty 1

## 2013-11-16 MED ORDER — PHENYLEPHRINE HCL 10 MG/ML IJ SOLN
INTRAMUSCULAR | Status: DC | PRN
  Administered 2013-11-16 (×6): 80 ug via INTRAVENOUS

## 2013-11-16 MED ORDER — HYDROMORPHONE HCL PF 1 MG/ML IJ SOLN
0.2500 mg | INTRAMUSCULAR | Status: DC | PRN
  Administered 2013-11-16: 1 mg via INTRAVENOUS
  Administered 2013-11-16 (×2): 0.5 mg via INTRAVENOUS

## 2013-11-16 MED ORDER — BUPIVACAINE-EPINEPHRINE PF 0.5-1:200000 % IJ SOLN
INTRAMUSCULAR | Status: DC | PRN
  Administered 2013-11-16: 100 mg via PERINEURAL

## 2013-11-16 MED ORDER — DEXAMETHASONE SODIUM PHOSPHATE 10 MG/ML IJ SOLN
INTRAMUSCULAR | Status: DC | PRN
  Administered 2013-11-16: 6 mg

## 2013-11-16 MED ORDER — EPHEDRINE SULFATE 50 MG/ML IJ SOLN
INTRAMUSCULAR | Status: AC
Start: 2013-11-16 — End: 2013-11-16
  Filled 2013-11-16: qty 1

## 2013-11-16 MED ORDER — GLYCOPYRROLATE 0.2 MG/ML IJ SOLN
INTRAMUSCULAR | Status: AC
  Filled 2013-11-16: qty 2

## 2013-11-16 SURGICAL SUPPLY — 60 items
BANDAGE ELASTIC 6 VELCRO ST LF (GAUZE/BANDAGES/DRESSINGS) ×2 IMPLANT
BANDAGE ESMARK 6X9 LF (GAUZE/BANDAGES/DRESSINGS) ×1 IMPLANT
BLADE SAG 18X100X1.27 (BLADE) ×3 IMPLANT
BLADE SAW SGTL 13X75X1.27 (BLADE) ×3 IMPLANT
BLADE SURG ROTATE 9660 (MISCELLANEOUS) IMPLANT
BNDG CMPR 9X6 STRL LF SNTH (GAUZE/BANDAGES/DRESSINGS) ×1
BNDG CMPR MED 10X6 ELC LF (GAUZE/BANDAGES/DRESSINGS) ×1
BNDG ELASTIC 6X10 VLCR STRL LF (GAUZE/BANDAGES/DRESSINGS) ×3 IMPLANT
BNDG ESMARK 6X9 LF (GAUZE/BANDAGES/DRESSINGS) ×3
BOWL SMART MIX CTS (DISPOSABLE) ×3 IMPLANT
CAPT RP KNEE ×2 IMPLANT
CEMENT HV SMART SET (Cement) ×6 IMPLANT
COVER SURGICAL LIGHT HANDLE (MISCELLANEOUS) ×3 IMPLANT
CUFF TOURNIQUET SINGLE 34IN LL (TOURNIQUET CUFF) ×2 IMPLANT
CUFF TOURNIQUET SINGLE 44IN (TOURNIQUET CUFF) IMPLANT
DRAPE EXTREMITY T 121X128X90 (DRAPE) ×3 IMPLANT
DRAPE U-SHAPE 47X51 STRL (DRAPES) ×3 IMPLANT
DRSG PAD ABDOMINAL 8X10 ST (GAUZE/BANDAGES/DRESSINGS) ×2 IMPLANT
DURAPREP 26ML APPLICATOR (WOUND CARE) ×3 IMPLANT
ELECT REM PT RETURN 9FT ADLT (ELECTROSURGICAL) ×3
ELECTRODE REM PT RTRN 9FT ADLT (ELECTROSURGICAL) ×1 IMPLANT
EVACUATOR 1/8 PVC DRAIN (DRAIN) ×3 IMPLANT
GAUZE XEROFORM 1X8 LF (GAUZE/BANDAGES/DRESSINGS) ×3 IMPLANT
GLOVE BIO SURGEON STRL SZ7.5 (GLOVE) ×3 IMPLANT
GLOVE BIO SURGEON STRL SZ8.5 (GLOVE) ×3 IMPLANT
GLOVE BIOGEL PI IND STRL 8 (GLOVE) ×1 IMPLANT
GLOVE BIOGEL PI IND STRL 9 (GLOVE) ×1 IMPLANT
GLOVE BIOGEL PI INDICATOR 8 (GLOVE) ×2
GLOVE BIOGEL PI INDICATOR 9 (GLOVE) ×2
GOWN PREVENTION PLUS XLARGE (GOWN DISPOSABLE) ×3 IMPLANT
GOWN STRL NON-REIN LRG LVL3 (GOWN DISPOSABLE) ×3 IMPLANT
GOWN STRL REIN XL XLG (GOWN DISPOSABLE) ×3 IMPLANT
HANDPIECE INTERPULSE COAX TIP (DISPOSABLE) ×3
HOOD PEEL AWAY FACE SHEILD DIS (HOOD) ×6 IMPLANT
KIT BASIN OR (CUSTOM PROCEDURE TRAY) ×3 IMPLANT
KIT ROOM TURNOVER OR (KITS) ×3 IMPLANT
MANIFOLD NEPTUNE II (INSTRUMENTS) ×3 IMPLANT
NDL SAFETY ECLIPSE 18X1.5 (NEEDLE) IMPLANT
NDL SPNL 18GX3.5 QUINCKE PK (NEEDLE) IMPLANT
NEEDLE HYPO 18GX1.5 SHARP (NEEDLE)
NEEDLE SPNL 18GX3.5 QUINCKE PK (NEEDLE) IMPLANT
NS IRRIG 1000ML POUR BTL (IV SOLUTION) ×3 IMPLANT
PACK TOTAL JOINT (CUSTOM PROCEDURE TRAY) ×3 IMPLANT
PAD ARMBOARD 7.5X6 YLW CONV (MISCELLANEOUS) ×6 IMPLANT
PADDING CAST COTTON 6X4 STRL (CAST SUPPLIES) ×3 IMPLANT
SET HNDPC FAN SPRY TIP SCT (DISPOSABLE) ×1 IMPLANT
SPONGE GAUZE 4X4 12PLY (GAUZE/BANDAGES/DRESSINGS) ×6 IMPLANT
SPONGE GAUZE 4X4 12PLY STER LF (GAUZE/BANDAGES/DRESSINGS) ×2 IMPLANT
STAPLER VISISTAT 35W (STAPLE) ×3 IMPLANT
SUCTION FRAZIER TIP 10 FR DISP (SUCTIONS) ×3 IMPLANT
SUT VIC AB 0 CTX 36 (SUTURE) ×3
SUT VIC AB 0 CTX36XBRD ANTBCTR (SUTURE) ×1 IMPLANT
SUT VIC AB 1 CTX 36 (SUTURE) ×3
SUT VIC AB 1 CTX36XBRD ANBCTR (SUTURE) ×1 IMPLANT
SUT VIC AB 2-0 CT1 27 (SUTURE) ×3
SUT VIC AB 2-0 CT1 TAPERPNT 27 (SUTURE) ×1 IMPLANT
SYR 50ML LL SCALE MARK (SYRINGE) ×3 IMPLANT
TOWEL OR 17X24 6PK STRL BLUE (TOWEL DISPOSABLE) ×3 IMPLANT
TOWEL OR 17X26 10 PK STRL BLUE (TOWEL DISPOSABLE) ×3 IMPLANT
WATER STERILE IRR 1000ML POUR (IV SOLUTION) ×6 IMPLANT

## 2013-11-16 NOTE — Op Note (Signed)
PATIENT ID:      [name] [name]  MRN:     [id number] DOB/AGE:    70-30-1947 / 70 y.o.       OPERATIVE REPORT    DATE OF PROCEDURE:  11/16/2013       PREOPERATIVE DIAGNOSIS:   RIGHT KNEE OSTEOARTHRITIS      Estimated body mass index is 30.42 kg/(m^2) as calculated from the following:   Height as of 11/09/13: 5\' 10"  (1.778 m).   Weight as of 10/14/13: 96.163 kg (212 lb).                                                        POSTOPERATIVE DIAGNOSIS:   RIGHT KNEE OSTEOARTHRITIS                                                                      PROCEDURE:  Procedure(s): TOTAL KNEE ARTHROPLASTY Using Depuy Sigma RP implants #5R Femur, #5Tibia, 80mm sigma RP bearing, 38 Patella     SURGEON: [name] [name] [name]    ASSISTANT:   [name] [name]   (Present and scrubbed throughout the case, critical for assistance with exposure, retraction, instrumentation, and closure.)         ANESTHESIA: GET Exparel  DRAINS: foley, 2 medium hemovac in knee   TOURNIQUET TIME: 63KZS   COMPLICATIONS:  None     SPECIMENS: None   INDICATIONS FOR PROCEDURE: The patient has  RIGHT KNEE OSTEOARTHRITIS, varus deformities, XR shows bone on bone arthritis. Patient has failed all conservative measures including anti-inflammatory medicines, narcotics, attempts at  exercise and weight loss, cortisone injections and viscosupplementation.  Risks and benefits of surgery have been discussed, questions answered.   DESCRIPTION OF PROCEDURE: The patient identified by armband, received  IV antibiotics, in the holding area at [facility]. Patient taken to the operating room, appropriate anesthetic  monitors were attached, and general endotracheal anesthesia induced with  the patient in supine position, Foley catheter was inserted. Tourniquet  applied high to the operative thigh. Lateral post and foot positioner  applied to the table, the lower extremity was then prepped and draped  in usual sterile fashion from the ankle  to the tourniquet. Time-out procedure was performed. The limb was wrapped with an Esmarch bandage and the tourniquet inflated to 350 mmHg. We began the operation by making the anterior midline incision starting at handbreadth above the patella going over the patella 1 cm medial to and  4 cm distal to the tibial tubercle. Small bleeders in the skin and the  subcutaneous tissue identified and cauterized. Transverse retinaculum was incised and reflected medially and a medial parapatellar arthrotomy was accomplished. the patella was everted and theprepatellar fat pad resected. The superficial medial collateral  ligament was then elevated from anterior to posterior along the proximal  flare of the tibia and anterior half of the menisci resected. The knee was hyperflexed exposing bone on bone arthritis. Peripheral and notch osteophytes as well as the cruciate ligaments were then resected. We continued to  work our way around posteriorly along the  proximal tibia, and externally  rotated the tibia subluxing it out from underneath the femur. A McHale  retractor was placed through the notch and a lateral Hohmann retractor  placed, and we then drilled through the proximal tibia in line with the  axis of the tibia followed by an intramedullary guide rod and 2-degree  posterior slope cutting guide. The tibial cutting guide was pinned into place  allowing resection of 6 mm of bone medially and about 11 mm of bone  laterally because of her varus deformity. Satisfied with the tibial resection, we then  entered the distal femur 2 mm anterior to the PCL origin with the  intramedullary guide rod and applied the distal femoral cutting guide  set at 47mm, with 5 degrees of valgus. This was pinned along the  epicondylar axis. At this point, the distal femoral cut was accomplished without difficulty. We then sized for a #5R femoral component and pinned the guide in 3 degrees of external rotation.The chamfer cutting guide  was pinned into place. The anterior, posterior, and chamfer cuts were accomplished without difficulty followed by  the Sigma RP box cutting guide and the box cut. We also removed posterior osteophytes from the posterior femoral condyles. At this  time, the knee was brought into full extension. We checked our  extension and flexion gaps and found them symmetric at 40mm.  The patella thickness measured at 25 mm. We set the cutting guide at 15 and removed the posterior 10 mm  of the patella, sized for a 38 button and drilled the lollipop. The knee  was then once again hyperflexed exposing the proximal tibia. We sized for a #5 tibial base plate, applied the smokestack and the conical reamer followed by the the Delta fin keel punch. We then hammered into place the Sigma RP trial femoral component, inserted a 10-mm trial bearing, trial patellar button, and took the knee through range of motion from 0-130 degrees. No thumb pressure was required for patellar  tracking. At this point, all trial components were removed, a double batch of DePuy HV cement with 1500 mg of Zinacef was mixed and applied to all bony metallic mating surfaces except for the posterior condyles of the femur itself. In order, we  hammered into place the tibial tray and removed excess cement, the femoral component and removed excess cement, a 10-mm Sigma RP bearing  was inserted, and the knee brought to full extension with compression.  The patellar button was clamped into place, and excess cement  removed. While the cement cured the wound was irrigated out with normal saline solution pulse lavage, and medium Hemovac drains were placed from an anterolateral  approach. Ligament stability and patellar tracking were checked and found to be excellent. The parapatellar arthrotomy was closed with  running #1 Vicryl suture. The subcutaneous tissue with 0 and 2-0 undyed  Vicryl suture, and the skin with skin staples. A dressing of Xeroform,  4 x  4, dressing sponges, Webril, and Ace wrap applied. The patient  awakened, extubated, and taken to recovery room without difficulty.   [name] [name] 11/16/2013, 8:35 AM

## 2013-11-16 NOTE — Transfer of Care (Signed)
Immediate Anesthesia Transfer of Care Note  Patient: [name] [name]  Procedure(s) Performed: Procedure(s): TOTAL KNEE ARTHROPLASTY (Right)  Patient Location: PACU  Anesthesia Type:GA combined with regional for post-op pain  Level of Consciousness: awake, sedated, patient cooperative and responds to stimulation  Airway & Oxygen Therapy: Patient Spontanous Breathing and Patient connected to face mask oxygen  Post-op Assessment: Report given to PACU RN, Post -op Vital signs reviewed and stable and Patient moving all extremities  Post vital signs: Reviewed and stable  Complications: No apparent anesthesia complications

## 2013-11-16 NOTE — Anesthesia Procedure Notes (Signed)
Anesthesia Regional Block:  Adductor canal block  Pre-Anesthetic Checklist: ,, timeout performed, Correct Patient, Correct Site, Correct Laterality, Correct Procedure, Correct Position, site marked, Risks and benefits discussed,  Surgical consent,  Pre-op evaluation,  At surgeon's request and post-op pain management  Laterality: Right  Prep: chloraprep       Needles:  Injection technique: Single-shot  Needle Type: Echogenic Stimulator Needle     Needle Length: 10cm 10 cm Needle Gauge: 21 and 21 G    Additional Needles:  Procedures: ultrasound guided (picture in chart) Adductor canal block Narrative:  Start time: 11/16/2013 7:08 AM End time: 11/16/2013 7:18 AM Injection made incrementally with aspirations every 5 mL.  Performed by: Personally  Anesthesiologist: Earnest Bailey, MD  Additional Notes: Functioning IV was confirmed and monitors were applied.  A 136mm 21ga Arrow echogenic stimulator needle was used. Sterile prep and drape,hand hygiene and sterile gloves were used. Ultrasound guidance: relevant anatomy identified, needle position confirmed, local anesthetic spread visualized around nerve(s)., vascular puncture avoided.  Image printed for medical record. Negative aspiration and negative test dose prior to incremental administration of local anesthetic. The patient tolerated the procedure well.

## 2013-11-16 NOTE — Progress Notes (Signed)
Orthopedic Tech Progress Note Patient Details:  [name] [name] 08/30/1946 [id number]  CPM Right Knee CPM Right Knee: On Right Knee Flexion (Degrees): 60 Right Knee Extension (Degrees): 0 Additional Comments: Trapeze bar   Cammer, Theodoro Parma 11/16/2013, 12:39 PM

## 2013-11-16 NOTE — Evaluation (Signed)
Physical Therapy Evaluation Patient Details Name: [name] [name] MRN: [id number] DOB: 1946/06/08 Today's Date: 11/16/2013 Time: 5176-1607 PT Time Calculation (min): 21 min  PT Assessment / Plan / Recommendation History of Present Illness  R TKA  Clinical Impression  Pt very pleasant and mobilizing well POD#0. Pt with prior L TKA and familiar with procedure. Pt educated for HEP and with heel roll end of session. Pt with below deficits who will benefit from acute therapy to maximize mobility, ROM, strength and function to decrease burden of care and return pt to PLOF. Will follow.     PT Assessment  Patient needs continued PT services    Follow Up Recommendations  Home health PT    Does the patient have the potential to tolerate intense rehabilitation      Barriers to Discharge        Equipment Recommendations  Rolling walker with 5" wheels    Recommendations for Other Services     Frequency 7X/week    Precautions / Restrictions Precautions Precautions: Knee Restrictions RLE Weight Bearing: Weight bearing as tolerated   Pertinent Vitals/Pain 91-95% on RA throughout 4/10 post op pain     Mobility  Bed Mobility Overal bed mobility: Needs Assistance Bed Mobility: Supine to Sit Supine to sit: Min assist General bed mobility comments: min assist for RLE with cues for sequence Transfers Overall transfer level: Needs assistance Transfers: Sit to/from Stand Sit to Stand: Supervision General transfer comment: cueing for hand and RLE placement Ambulation/Gait Ambulation/Gait assistance: Supervision Ambulation Distance (Feet): 40 Feet Assistive device: Rolling walker (2 wheeled) Gait Pattern/deviations: Step-to pattern;Decreased stance time - right;Antalgic Gait velocity interpretation: Below normal speed for age/gender    Exercises Total Joint Exercises Ankle Circles/Pumps: AROM;Right;5 reps;Supine Quad Sets: AROM;Supine;5 reps;Right Heel Slides: AROM;5  reps;Right;Supine   PT Diagnosis: Difficulty walking;Acute pain  PT Problem List: Decreased strength;Decreased range of motion;Decreased activity tolerance;Decreased mobility;Pain PT Treatment Interventions: DME instruction;Gait training;Stair training;Functional mobility training;Therapeutic activities;Therapeutic exercise;Patient/family education     PT Goals(Current goals can be found in the care plan section) Acute Rehab PT Goals Patient Stated Goal: return to fishing PT Goal Formulation: With patient/family Time For Goal Achievement: 11/23/13 Potential to Achieve Goals: Good  Visit Information  Last PT Received On: 11/16/13 Assistance Needed: +1 History of Present Illness: R TKA       Prior [name] expects to be discharged to:: Private residence Living Arrangements: Spouse/significant other Available Help at Discharge: Family Type of Home: House Home Access: Stairs to enter Technical brewer of Steps: [place]: Two level;Able to live on main level with bedroom/bathroom Home Equipment: Shower seat - built in;Walker - standard Prior Function Level of Independence: Independent Communication Communication: No difficulties    Cognition  Cognition Arousal/Alertness: Awake/alert Behavior During Therapy: WFL for tasks assessed/performed Overall Cognitive Status: Within Functional Limits for tasks assessed    Extremity/Trunk Assessment Upper Extremity Assessment Upper Extremity Assessment: Overall WFL for tasks assessed Lower Extremity Assessment Lower Extremity Assessment: RLE deficits/detail RLE Deficits / Details: decreased strength and ROM as expected post-op Cervical / Trunk Assessment Cervical / Trunk Assessment: Normal   Balance    End of Session PT - End of Session Activity Tolerance: Patient tolerated treatment well Patient left: in chair;with call bell/phone within [name];with family/visitor present Nurse  Communication: Mobility status;Weight bearing status CPM Right Knee CPM Right Knee: Off Right Knee Flexion (Degrees): 60 Right Knee Extension (Degrees): 0 Additional Comments: Trapeze bar  GP  [name] [name] [name] [name] 11/16/2013, 2:13 PM [name] [name], [place]

## 2013-11-16 NOTE — Progress Notes (Signed)
Utilization review completed.  

## 2013-11-16 NOTE — Anesthesia Postprocedure Evaluation (Signed)
Anesthesia Post Note  Patient: [name] [name]  Procedure(s) Performed: Procedure(s) (LRB): TOTAL KNEE ARTHROPLASTY (Right)  Anesthesia type: general  Patient location: PACU  Post pain: Pain level controlled  Post assessment: Patient's Cardiovascular Status Stable  Last Vitals:  Filed Vitals:   11/16/13 1005  BP: 119/69  Pulse: 68  Temp: 36.9 C  Resp: 18    Post vital signs: Reviewed and stable  Level of consciousness: sedated  Complications: No apparent anesthesia complications

## 2013-11-16 NOTE — Plan of Care (Signed)
Problem: Consults Goal: Diagnosis- Total Joint Replacement Primary Total Knee Right     

## 2013-11-16 NOTE — Anesthesia Preprocedure Evaluation (Addendum)
Anesthesia Evaluation  Patient identified by MRN, date of birth, ID band Patient awake    Reviewed: Allergy & Precautions, H&P , NPO status , Patient's Chart, lab work & pertinent test results, reviewed documented beta blocker date and time   Airway Mallampati: II TM Distance: >3 FB Neck ROM: Full    Dental  (+) Caps, Loose, Dental Advisory Given   Pulmonary asthma , COPDformer smoker,    Pulmonary exam normal       Cardiovascular hypertension, Pt. on medications and Pt. on home beta blockers + CAD and + Past MI     Neuro/Psych negative neurological ROS  negative psych ROS   GI/Hepatic GERD-  ,  Endo/Other    Renal/GU      Musculoskeletal   Abdominal   Peds  Hematology   Anesthesia Other Findings   Reproductive/Obstetrics                          Anesthesia Physical Anesthesia Plan  ASA: III  Anesthesia Plan: General   Post-op Pain Management:    Induction: Intravenous  Airway Management Planned: LMA  Additional Equipment:   Intra-op Plan:   Post-operative Plan: Extubation in OR  Informed Consent: I have reviewed the patients History and Physical, chart, labs and discussed the procedure including the risks, benefits and alternatives for the proposed anesthesia with the patient or authorized representative who has indicated his/her understanding and acceptance.     Plan Discussed with: CRNA, Anesthesiologist and Surgeon  Anesthesia Plan Comments:        Anesthesia Quick Evaluation

## 2013-11-16 NOTE — Interval H&P Note (Signed)
History and Physical Interval Note:  11/16/2013 7:08 AM  [name] [name]  has presented today for surgery, with the diagnosis of RIGHT KNEE OSTEOARTHRITIS  The various methods of treatment have been discussed with the patient and family. After consideration of risks, benefits and other options for treatment, the patient has consented to  Procedure(s): TOTAL KNEE ARTHROPLASTY (Right) as a surgical intervention .  The patient's history has been reviewed, patient examined, no change in status, stable for surgery.  I have reviewed the patient's chart and labs.  Questions were answered to the patient's satisfaction.     Kerin Salen

## 2013-11-17 LAB — CBC
HCT: 31.1 % — ABNORMAL LOW (ref 39.0–52.0)
Hemoglobin: 10.5 g/dL — ABNORMAL LOW (ref 13.0–17.0)
MCH: 29.2 pg (ref 26.0–34.0)
MCHC: 33.8 g/dL (ref 30.0–36.0)
MCV: 86.4 fL (ref 78.0–100.0)
Platelets: 234 10*3/uL (ref 150–400)
RBC: 3.6 MIL/uL — ABNORMAL LOW (ref 4.22–5.81)
RDW: 13.3 % (ref 11.5–15.5)
WBC: 14 10*3/uL — AB (ref 4.0–10.5)

## 2013-11-17 NOTE — Progress Notes (Signed)
Physical Therapy Treatment Patient Details Name: [name] [name] MRN: [id number] DOB: [birth year] Today's Date: 11/17/2013 Time: 1026-1100 PT Time Calculation (min): 34 min  PT Assessment / Plan / Recommendation  History of Present Illness R TKA   PT Comments   Pt progressing well towards physical therapy goals. Was able to demonstrate safety with transfers and ambulation, and ability to negotiate steps using sideways technique. From a mobility standpoint, pt is ready for d/c home.   Follow Up Recommendations  Home health PT     Does the patient have the potential to tolerate intense rehabilitation     Barriers to Discharge        Equipment Recommendations  Rolling walker with 5" wheels    Recommendations for Other Services    Frequency 7X/week   Progress towards PT Goals Progress towards PT goals: Progressing toward goals  Plan Current plan remains appropriate    Precautions / Restrictions Precautions Precautions: Knee Precaution Comments: Discussed towel roll under heel and NO pillow under knee.  Restrictions Weight Bearing Restrictions: Yes RLE Weight Bearing: Weight bearing as tolerated   Pertinent Vitals/Pain Pt complains of heartburn and increased pain with exercise. RN notified.     Mobility  Bed Mobility General bed mobility comments: Pt received sitting up in recliner.  Transfers Overall transfer level: Needs assistance Equipment used: Rolling walker (2 wheeled) Transfers: Sit to/from Stand Sit to Stand: Supervision General transfer comment: Pt demonstrated proper hand placement and safety awareness.  Ambulation/Gait Ambulation/Gait assistance: Min guard;Supervision Ambulation Distance (Feet): 100 Feet Assistive device: Rolling walker (2 wheeled) Gait Pattern/deviations: Step-through pattern;Decreased stride length Gait velocity: Decreased Gait velocity interpretation: Below normal speed for age/gender General Gait Details: VC's for improved posture,  increased heel strike, and to offload UE's from walker.  Stairs: Yes Stairs assistance: Min guard Stair Management: One rail Right;Sideways Number of Stairs: 4 (x2) General stair comments: Pt able to negotiate 4 steps with bilateral UE's supported on R railing. VC's for sequencing and safety awareness.     Exercises Total Joint Exercises Ankle Circles/Pumps: 15 reps Quad Sets: 15 reps Short Arc Quad: 15 reps Heel Slides: 15 reps Hip ABduction/ADduction: 15 reps Long Arc Quad: 15 reps Knee Flexion: 5 reps (static hold) Goniometric ROM: 94 AROM   PT Diagnosis:    PT Problem List:   PT Treatment Interventions:     PT Goals (current goals can now be found in the care plan section) Acute Rehab PT Goals Patient Stated Goal: Home with spouse assist PRN PT Goal Formulation: With patient/family Time For Goal Achievement: 11/23/13 Potential to Achieve Goals: Good  Visit Information  Last PT Received On: 11/17/13 Assistance Needed: +1 History of Present Illness: R TKA    Subjective Data  Subjective: "I have heartburn that is burning me up right now." Patient Stated Goal: Home with spouse assist PRN   Cognition  Cognition Arousal/Alertness: Awake/alert Behavior During Therapy: WFL for tasks assessed/performed Overall Cognitive Status: Within Functional Limits for tasks assessed    Balance  Balance Overall balance assessment: No apparent balance deficits (not formally assessed)  End of Session PT - End of Session Equipment Utilized During Treatment: Gait belt Activity Tolerance: Patient tolerated treatment well Patient left: in chair;with call bell/phone within reach;with family/visitor present Nurse Communication: Mobility status;Weight bearing status CPM Right Knee CPM Right Knee: Off   GP     Jolyn Lent 11/17/2013, 12:09 PM  Jolyn Lent, PT, DPT [phone]

## 2013-11-17 NOTE — Progress Notes (Signed)
Patient ID: [name] [name], male   DOB: 23-Sep-1945, 68 y.o.   MRN: [id number] PATIENT ID: [name] [name]  MRN: [id number]  DOB/AGE:  11-Oct-1945 / 68 y.o.  1 Day Post-Op Procedure(s) (LRB): TOTAL KNEE ARTHROPLASTY (Right)    PROGRESS NOTE Subjective: Patient is alert, oriented, 2x Nausea, 1x Vomiting yesterday, yes passing gas, no Bowel Movement. Taking PO well this morning. Denies SOB, Chest or Calf Pain. Using Incentive Spirometer, PAS in place. Ambulate walked in room yesterday, CPM 0-60 Patient reports pain as 3 on 0-10 scale  .    Objective: Vital signs in last 24 hours: Filed Vitals:   11/16/13 1500 11/16/13 2009 11/17/13 0231 11/17/13 0602  BP: 127/62 105/69 115/52 103/55  Pulse: 66 108 71 66  Temp: 97.5 F (36.4 C) 97.6 F (36.4 C) 97.7 F (36.5 C) 97.7 F (36.5 C)  TempSrc: Oral Oral  Oral  Resp: 16 20  18   SpO2: 99% 98% 92% 93%      Intake/Output from previous day: I/O last 3 completed shifts: In: 3502.1 [P.O.:600; I.V.:2902.1] Out: 1400 [Urine:875; Drains:525]   Intake/Output this shift:     LABORATORY DATA:  Recent Labs  11/17/13 0355  WBC 14.0*  HGB 10.5*  HCT 31.1*  PLT 234    Examination: Neurologically intact Neurovascular intact Sensation intact distally Intact pulses distally Dorsiflexion/Plantar flexion intact Incision: no drainage No cellulitis present Compartment soft Abdomen is 1+ distended but the patient is passing gas and burping without difficulty. Blood and plasma separated in drain indicating minimal recent drainage, drain pulled without difficulty.  Assessment:   1 Day Post-Op Procedure(s) (LRB): TOTAL KNEE ARTHROPLASTY (Right) ADDITIONAL DIAGNOSIS:  History of coronary artery disease, GERD, arthritis.  Plan: PT/OT WBAT, CPM 5/hrs day until ROM 0-90 degrees, then D/C CPM DVT Prophylaxis:  SCDx72hrs, ASA 325 mg BID x 2 weeks DISCHARGE PLAN: Home, probably tomorrow. DISCHARGE NEEDS: HHPT, HHRN, CPM, Walker and 3-in-1 comode  seat     [name] [name] J 11/17/2013, 7:09 AM

## 2013-11-17 NOTE — Care Management Note (Signed)
CARE MANAGEMENT NOTE 11/17/2013  Patient:  [name] [name], [name] [name]   Account Number:  [id number]  Date Initiated:  11/17/2013  Documentation initiated by:  Ricki Miller  Subjective/Objective Assessment:   68 yr old male s/p right total knee arthroplasty.     Action/Plan:   Patient preoperatively setup with [facility], no changes. Rolling walker, 3in1 and CPM have been delivered to patient's home.   Anticipated DC Date:  11/18/2013   Anticipated DC Plan:  [place] Planning Services  CM consult      [facility] Choice  HOME HEALTH  DURABLE MEDICAL EQUIPMENT   Choice offered to / List presented to:  C-1 Patient   DME arranged  WALKER - ROLLING  3-N-1  CPM      DME agency  [employer]     [place] arranged  HH-2 PT      [place] agency  [facility]   Status of service:  Completed, signed off Medicare Important Message given?   (If response is "NO", the following Medicare IM given date fields will be blank) Date Medicare IM given:   Date Additional Medicare IM given:    Discharge Disposition:  [place]  Per UR Regulation:

## 2013-11-17 NOTE — Progress Notes (Signed)
Physical Therapy Treatment Patient Details Name: [name] [name] MRN: [id number] DOB: July 31, 1946 Today's Date: 11/17/2013 Time: 6767-2094 PT Time Calculation (min): 23 min  PT Assessment / Plan / Recommendation  History of Present Illness R TKA   PT Comments   Pt progressing towards physical therapy goals. Was able to improve ambulation distance and technique this session. Session focused on gait/stair training as morning session was exercise focused and pt sore as a result. Pt and wife anticipate d/c home tomorrow.   Follow Up Recommendations  Home health PT     Does the patient have the potential to tolerate intense rehabilitation     Barriers to Discharge        Equipment Recommendations  Rolling walker with 5" wheels    Recommendations for Other Services    Frequency 7X/week   Progress towards PT Goals Progress towards PT goals: Progressing toward goals  Plan Current plan remains appropriate    Precautions / Restrictions Precautions Precautions: Knee Precaution Comments: Discussed towel roll under heel and NO pillow under knee.  Restrictions Weight Bearing Restrictions: Yes RLE Weight Bearing: Weight bearing as tolerated   Pertinent Vitals/Pain Pt reports increased soreness from therapeutic exercise this morning. Pain not rated on 0-10 scale.     Mobility  Bed Mobility Overal bed mobility: Needs Assistance Bed Mobility: Supine to Sit;Sit to Supine Supine to sit: Min assist Sit to supine: Min assist General bed mobility comments: Minimal assist for movement and support of RLE. Pt transitions to/from EOB well.  Transfers Overall transfer level: Needs assistance Equipment used: Rolling walker (2 wheeled) Transfers: Sit to/from Stand Sit to Stand: Supervision General transfer comment: Pt demonstrated proper hand placement and safety awareness.  Ambulation/Gait Ambulation/Gait assistance: Min guard;Supervision Ambulation Distance (Feet): 125 Feet Assistive device:  Rolling walker (2 wheeled) Gait Pattern/deviations: Step-through pattern;Decreased stride length Gait velocity: Decreased Gait velocity interpretation: Below normal speed for age/gender General Gait Details: VC's for improved posture, increased heel strike, and to offload UE's from walker.  Stairs: Yes Stairs assistance: Min guard Stair Management: One rail Right;Sideways Number of Stairs: 4 (x2) General stair comments: Pt able to negotiate 4 steps with bilateral UE's supported on R railing. VC's for sequencing and safety awareness.     Exercises Total Joint Exercises Ankle Circles/Pumps: 15 reps Quad Sets: 15 reps Short Arc Quad: 15 reps Heel Slides: 15 reps Hip ABduction/ADduction: 15 reps Long Arc Quad: 15 reps Knee Flexion: 5 reps (static hold) Goniometric ROM: 94 AROM   PT Diagnosis:    PT Problem List:   PT Treatment Interventions:     PT Goals (current goals can now be found in the care plan section) Acute Rehab PT Goals Patient Stated Goal: Home with spouse assist PRN PT Goal Formulation: With patient/family Time For Goal Achievement: 11/23/13 Potential to Achieve Goals: Good  Visit Information  Last PT Received On: 11/17/13 Assistance Needed: +1 History of Present Illness: R TKA    Subjective Data  Subjective: "I'll get back in the machine after supper." Patient Stated Goal: Home with spouse assist PRN   Cognition  Cognition Arousal/Alertness: Awake/alert Behavior During Therapy: WFL for tasks assessed/performed Overall Cognitive Status: Within Functional Limits for tasks assessed    Balance  Balance Overall balance assessment: No apparent balance deficits (not formally assessed)  End of Session PT - End of Session Equipment Utilized During Treatment: Gait belt Activity Tolerance: Patient tolerated treatment well Patient left: in chair;with call bell/phone within reach;with family/visitor present Nurse Communication: Mobility status;Weight  bearing  status CPM Right Knee CPM Right Knee: On Right Knee Flexion (Degrees): 75   GP     Jolyn Lent 11/17/2013, 3:23 PM  Jolyn Lent, [place], DPT [phone]

## 2013-11-17 NOTE — Evaluation (Signed)
Occupational Therapy Evaluation Patient Details Name: [name] [name] MRN: [id number] DOB: 04/09/1946 Today's Date: 11/17/2013 Time: 5093-2671 OT Time Calculation (min): 23 min  OT Assessment / Plan / Recommendation History of present illness R TKA   Clinical Impression   Pt is currently at supervision level for ADL's and transfers w/o A/E. He was also supervision level for simulated shower transfer as performed today and reports that he has no further acute OT needs at this time. Pt plans to d/c home w/ PRN spouse assist.Will sign off OT.    OT Assessment  Patient does not need any further OT services    Follow Up Recommendations  No OT follow up Intermittent/PRN Assist   Barriers to Discharge      Equipment Recommendations  None recommended by OT    Recommendations for Other Services    Frequency       Precautions / Restrictions Precautions Precautions: Knee Restrictions Weight Bearing Restrictions: Yes RLE Weight Bearing: Weight bearing as tolerated   Pertinent Vitals/Pain 5/10 R knee pain. Pt reports that he was premedicated earlier. RN made aware of pt c/o "Itchiness" and pt request for medication for this.     ADL  Eating/Feeding: Simulated;Modified independent Where Assessed - Eating/Feeding: Chair Grooming: Simulated;Supervision/safety Where Assessed - Grooming: Supported standing;Unsupported standing Upper Body Bathing: Simulated;Set up;Modified independent Where Assessed - Upper Body Bathing: Supported sitting Lower Body Bathing: Simulated;Supervision/safety;Set up Where Assessed - Lower Body Bathing: Supported sit to stand Upper Body Dressing: Simulated;Modified independent Where Assessed - Upper Body Dressing: Unsupported sitting Lower Body Dressing: Performed;Modified independent;Supervision/safety (Pt able to don/doof R sock seated. Has LH reacher at home as well) Where Assessed - Lower Body Dressing: Supported sit to Lobbyist:  Chartered loss adjuster Method: Sit to Loss adjuster, chartered: Comfort height toilet Toileting - Water quality scientist and Hygiene: Performed;Supervision/safety Where Assessed - Best boy and Hygiene: Sit to stand from 3-in-1 or toilet Tub/Shower Transfer: Simulated;Supervision/safety (Pt ambulated & stepped up & over towel roll to simulate shower transfer.) Tub/Shower Transfer Method: Therapist, art: Walk in shower Equipment Used: Rolling walker Transfers/Ambulation Related to ADLs: Pt overall supervision level for functional mobility using RW ADL Comments: Pt was educated in role of OT. He participated in simulated shower transfer, toileting transfers and LB dressing sit to stand w/o A/E. Pt reports that he has a long handled reacher at home & was educated in use for LB dressing etc. He states that he will have PRN assist from his spouse at d/c and reports no further OT needs at this time.    OT Diagnosis:    OT Problem List:   OT Treatment Interventions:     OT Goals(Current goals can be found in the care plan section) Acute Rehab OT Goals Patient Stated Goal: Home with spouse assist PRN  Visit Information  Last OT Received On: 11/17/13 Assistance Needed: +1 History of Present Illness: R TKA       Prior [place] expects to be discharged to:: Private residence Living Arrangements: Spouse/significant other Available Help at Discharge: Family Type of Home: House Home Access: Stairs to enter Technical brewer of Steps: [place]: Two level;Able to live on main level with bedroom/bathroom Home Equipment: Shower seat - built in;Walker - standard Prior Function Level of Independence: Independent Communication Communication: No difficulties Dominant Hand: Right    Vision/Perception Vision - History Baseline Vision: Other (comment) (Has glasses for all the  time, but  only uses for reading) Patient Visual Report: No change from baseline   Cognition  Cognition Arousal/Alertness: Awake/alert Behavior During Therapy: WFL for tasks assessed/performed Overall Cognitive Status: Within Functional Limits for tasks assessed    Extremity/Trunk Assessment Upper Extremity Assessment Upper Extremity Assessment: Overall WFL for tasks assessed Lower Extremity Assessment Lower Extremity Assessment: Defer to PT evaluation Cervical / Trunk Assessment Cervical / Trunk Assessment: Normal    Mobility Bed Mobility General bed mobility comments: Pt up in chair upon arrival Transfers Overall transfer level: Needs assistance Equipment used: Rolling walker (2 wheeled) Transfers: Sit to/from Stand Sit to Stand: Supervision        Balance Balance Overall balance assessment: No apparent balance deficits (not formally assessed)   End of Session OT - End of Session Equipment Utilized During Treatment: Rolling walker Activity Tolerance: Patient tolerated treatment well Patient left: in chair;with call bell/phone within reach Nurse Communication: Mobility status;Other (comment) (Pt requests something for "Itchiness") CPM Right Knee CPM Right Knee: Off  GO     Almyra Deforest 11/17/2013, 9:41 AM

## 2013-11-18 ENCOUNTER — Encounter (HOSPITAL_COMMUNITY): Payer: Self-pay | Admitting: General Practice

## 2013-11-18 ENCOUNTER — Inpatient Hospital Stay (HOSPITAL_COMMUNITY): Payer: Medicare Other

## 2013-11-18 LAB — URINALYSIS, ROUTINE W REFLEX MICROSCOPIC
BILIRUBIN URINE: NEGATIVE
Glucose, UA: NEGATIVE mg/dL
HGB URINE DIPSTICK: NEGATIVE
Ketones, ur: NEGATIVE mg/dL
Nitrite: NEGATIVE
PH: 6.5 (ref 5.0–8.0)
Protein, ur: NEGATIVE mg/dL
SPECIFIC GRAVITY, URINE: 1.017 (ref 1.005–1.030)
Urobilinogen, UA: 0.2 mg/dL (ref 0.0–1.0)

## 2013-11-18 LAB — CBC
HCT: 31 % — ABNORMAL LOW (ref 39.0–52.0)
HEMOGLOBIN: 10.3 g/dL — AB (ref 13.0–17.0)
MCH: 28.9 pg (ref 26.0–34.0)
MCHC: 33.2 g/dL (ref 30.0–36.0)
MCV: 86.8 fL (ref 78.0–100.0)
PLATELETS: 229 10*3/uL (ref 150–400)
RBC: 3.57 MIL/uL — ABNORMAL LOW (ref 4.22–5.81)
RDW: 13.6 % (ref 11.5–15.5)
WBC: 12.9 10*3/uL — AB (ref 4.0–10.5)

## 2013-11-18 LAB — URINE MICROSCOPIC-ADD ON

## 2013-11-18 NOTE — Progress Notes (Signed)
Physical Therapy Treatment Patient Details Name: [name] [name] MRN: [id number] DOB: [birth year] Today's Date: 11/18/2013 Time: 5852-7782 PT Time Calculation (min): 23 min  PT Assessment / Plan / Recommendation  History of Present Illness R TKA   PT Comments   Pt limited by pain this session, but continues to show improvement with transfers. Discussed HEP, car transfer, and safety awareness for wife and family while assisting pt into the house. Pt did well with ambulation and stair training in previous sessions, and reports feeling confident with entering the home. Pt and family anticipate d/c home this afternoon.  Follow Up Recommendations  Home health PT     Does the patient have the potential to tolerate intense rehabilitation     Barriers to Discharge        Equipment Recommendations  Rolling walker with 5" wheels    Recommendations for Other Services    Frequency 7X/week   Progress towards PT Goals Progress towards PT goals: Progressing toward goals  Plan Current plan remains appropriate    Precautions / Restrictions Precautions Precautions: Knee;Fall Precaution Comments: Discussed towel roll under heel and NO pillow under knee.  Restrictions Weight Bearing Restrictions: Yes RLE Weight Bearing: Weight bearing as tolerated   Pertinent Vitals/Pain Pt reports he had pain medication prior to PT session. Significantly limited by pain in mobility and therapeutic exercise, however no pain rating was given on 0-10 scale.     Mobility  Bed Mobility Overal bed mobility: Needs Assistance Bed Mobility: Supine to Sit Supine to sit: Supervision;HOB elevated General bed mobility comments: Increased time required to transition to EOB but no physical assist needed. Pt requested to perform transfer without help.  Transfers Overall transfer level: Needs assistance Equipment used: Rolling walker (2 wheeled) Transfers: Sit to/from Stand Sit to Stand: Supervision General transfer  comment: Pt demonstrated proper hand placement and safety awareness.  Ambulation/Gait Ambulation/Gait assistance: Min guard Ambulation Distance (Feet): 45 Feet Assistive device: Rolling walker (2 wheeled) Gait Pattern/deviations: Step-to pattern;Step-through pattern;Decreased stride length Gait velocity: Decreased Gait velocity interpretation: Below normal speed for age/gender General Gait Details: Pain limiting distance and technique this session. 3 standing rest breaks taken due to pain.  General stair comments: Stair training deferred due to pain. Pt did well negotiating steps in previous sessions, and does not need to practice again prior to d/c.     Exercises Total Joint Exercises Knee Flexion: 5 reps (Static hold) Goniometric ROM: 75 AROM   PT Diagnosis:    PT Problem List:   PT Treatment Interventions:     PT Goals (current goals can now be found in the care plan section) Acute Rehab PT Goals Patient Stated Goal: Home with spouse assist PRN PT Goal Formulation: With patient/family Time For Goal Achievement: 11/23/13 Potential to Achieve Goals: Good  Visit Information  Last PT Received On: 11/18/13 Assistance Needed: +1 History of Present Illness: R TKA    Subjective Data  Subjective: "It's hurting so much this morning." Patient Stated Goal: Home with spouse assist PRN   Cognition  Cognition Arousal/Alertness: Awake/alert Behavior During Therapy: WFL for tasks assessed/performed Overall Cognitive Status: Within Functional Limits for tasks assessed    Balance  Balance Overall balance assessment: No apparent balance deficits (not formally assessed)  End of Session PT - End of Session Equipment Utilized During Treatment: Gait belt Activity Tolerance: Patient limited by pain Patient left: in chair;with call bell/phone within reach;with family/visitor present Nurse Communication: Mobility status;Weight bearing status   GP  Jolyn Lent 11/18/2013, 1:31  PM  Jolyn Lent, PT, DPT Acute Rehabilitation Services Pager: [phone]

## 2013-11-18 NOTE — Progress Notes (Signed)
Physical Therapy Treatment Patient Details Name: [name] [name] MRN: [id number] DOB: [birth year] Today's Date: 11/18/2013 Time: 4481-8563 PT Time Calculation (min): 27 min  PT Assessment / Plan / Recommendation  History of Present Illness R TKA   PT Comments   Pt progressing towards physical therapy goals. Improved pain control compared to morning session, and pt was able to increase gait distance and showed improved technique. Again discussed car transfer and reviewed HEP and CPM use. Pt did well with stairs in previous sessions and would prefer to save energy for trip home rather than practice again this afternoon. Pt and wife anticipate d/c home this afternoon.   Follow Up Recommendations  Home health PT     Does the patient have the potential to tolerate intense rehabilitation     Barriers to Discharge        Equipment Recommendations  Rolling walker with 5" wheels    Recommendations for Other Services    Frequency 7X/week   Progress towards PT Goals Progress towards PT goals: Progressing toward goals  Plan Current plan remains appropriate    Precautions / Restrictions Precautions Precautions: Knee;Fall Precaution Comments: Discussed towel roll under heel and NO pillow under knee.  Restrictions Weight Bearing Restrictions: Yes RLE Weight Bearing: Weight bearing as tolerated   Pertinent Vitals/Pain Pt reports decreased pain from morning. Still mildly limited with mobility due to pain, however.     Mobility  Bed Mobility Overal bed mobility: Needs Assistance Bed Mobility: Supine to Sit Supine to sit: Supervision;HOB elevated (slightly) General bed mobility comments: Min assist to raise RLE out of the CPM and then supervision to transition to EOB.  Transfers Overall transfer level: Needs assistance Equipment used: Rolling walker (2 wheeled) Transfers: Sit to/from Stand Sit to Stand: Supervision General transfer comment: Pt demonstrated proper hand placement and safety  awareness.  Ambulation/Gait Ambulation/Gait assistance: Min guard Ambulation Distance (Feet): 100 Feet Assistive device: Rolling walker (2 wheeled) Gait Pattern/deviations: Step-to pattern;Step-through pattern;Decreased stride length Gait velocity: Decreased Gait velocity interpretation: Below normal speed for age/gender General Gait Details: 2 standing rest breaks throughout gait training due to pain/fatigue. Improved technique due to decreased pain compared to morning session.  General stair comments: Stair training again deferred due to pain. Pt did well negotiating steps in previous sessions, and does not need to practice again prior to d/c.     Exercises Total Joint Exercises Knee Flexion: 5 reps (Static hold) Goniometric ROM: 75 AROM   PT Diagnosis:    PT Problem List:   PT Treatment Interventions:     PT Goals (current goals can now be found in the care plan section) Acute Rehab PT Goals Patient Stated Goal: Home with spouse assist PRN PT Goal Formulation: With patient/family Time For Goal Achievement: 11/23/13 Potential to Achieve Goals: Good  Visit Information  Last PT Received On: 11/18/13 Assistance Needed: +1 History of Present Illness: R TKA    Subjective Data  Subjective: "I feel a little better this afternoon." Patient Stated Goal: Home with spouse assist PRN   Cognition  Cognition Arousal/Alertness: Awake/alert Behavior During Therapy: WFL for tasks assessed/performed Overall Cognitive Status: Within Functional Limits for tasks assessed    Balance  Balance Overall balance assessment: No apparent balance deficits (not formally assessed) General Comments General comments (skin integrity, edema, etc.): CPM donned at end of session, with ice packs applied and angle increased to 70.  End of Session PT - End of Session Equipment Utilized During Treatment: Gait belt Activity Tolerance: Patient  limited by pain Patient left: in chair;with call bell/phone  within reach;with family/visitor present Nurse Communication: Mobility status;Weight bearing status   GP     Jolyn Lent 11/18/2013, 4:34 PM  Jolyn Lent, PT, DPT Acute Rehabilitation Services Pager: [phone]

## 2013-11-18 NOTE — Progress Notes (Signed)
Orthopedic Tech Progress Note Patient Details:  GOHAN COLLISTER 15-May-1946 [id number] On cpm at 3:20 pm RLE 0-60 Patient ID: Lavell Islam, male   DOB: 1945-09-25, 68 y.o.   MRN: [id number]   Braulio Bosch 11/18/2013, 3:18 PM

## 2013-11-18 NOTE — Progress Notes (Signed)
PATIENT ID: [name] [name]  MRN: [id number]  DOB/AGE:  24-Aug-1946 / 68 y.o.  2 Days Post-Op Procedure(s) (LRB): TOTAL KNEE ARTHROPLASTY (Right)    PROGRESS NOTE Subjective: Patient is alert, oriented, no Nausea, no Vomiting, yes passing gas, yes Bowel Movement. Taking PO well. Denies SOB, Chest or Calf Pain. Using Incentive Spirometer, PAS in place. Ambulate WBAT, CPM 0-60 Patient reports pain as 7 on 0-10 scale.   Objective: Vital signs in last 24 hours: Filed Vitals:   11/17/13 0602 11/17/13 1846 11/17/13 2113 11/18/13 0548  BP: 103/55 126/63 133/64 132/55  Pulse: 66 74 83 85  Temp: 97.7 F (36.5 C) 98 F (36.7 C) 99.5 F (37.5 C) 99.3 F (37.4 C)  TempSrc: Oral Oral Oral Oral  Resp: 18 18 18 18   SpO2: 93% 95% 95% 95%      Intake/Output from previous day: I/O last 3 completed shifts: In: 2295 [P.O.:1320; I.V.:975] Out: 1475 [Urine:1475]   Intake/Output this shift:     LABORATORY DATA:  Recent Labs  11/17/13 0355 11/18/13 0505  WBC 14.0* 12.9*  HGB 10.5* 10.3*  HCT 31.1* 31.0*  PLT 234 229    Examination: Neurologically intact Neurovascular intact Sensation intact distally Intact pulses distally Dorsiflexion/Plantar flexion intact Incision: dressing C/D/I No cellulitis present Compartment soft}  Assessment:   2 Days Post-Op Procedure(s) (LRB): TOTAL KNEE ARTHROPLASTY (Right) ADDITIONAL DIAGNOSIS:  History of coronary artery disease, GERD, arthritis.  Plan: PT/OT WBAT, CPM 5/hrs day until ROM 0-90 degrees, then D/C CPM DVT Prophylaxis:  SCDx72hrs, ASA 325 mg BID x 2 weeks DISCHARGE PLAN: Home, today if pt continues to improve DISCHARGE NEEDS: HHPT, HHRN, CPM, Walker and 3-in-1 comode seat     Recie Cirrincione R 11/18/2013, 8:25 AM

## 2013-11-19 ENCOUNTER — Inpatient Hospital Stay (HOSPITAL_COMMUNITY): Payer: Medicare Other

## 2013-11-19 DIAGNOSIS — I2510 Atherosclerotic heart disease of native coronary artery without angina pectoris: Secondary | ICD-10-CM

## 2013-11-19 DIAGNOSIS — I10 Essential (primary) hypertension: Secondary | ICD-10-CM

## 2013-11-19 DIAGNOSIS — K219 Gastro-esophageal reflux disease without esophagitis: Secondary | ICD-10-CM

## 2013-11-19 DIAGNOSIS — M1710 Unilateral primary osteoarthritis, unspecified knee: Secondary | ICD-10-CM

## 2013-11-19 DIAGNOSIS — R079 Chest pain, unspecified: Secondary | ICD-10-CM

## 2013-11-19 DIAGNOSIS — IMO0002 Reserved for concepts with insufficient information to code with codable children: Secondary | ICD-10-CM

## 2013-11-19 LAB — CBC
HEMATOCRIT: 30.3 % — AB (ref 39.0–52.0)
HEMOGLOBIN: 10.1 g/dL — AB (ref 13.0–17.0)
MCH: 28.8 pg (ref 26.0–34.0)
MCHC: 33.3 g/dL (ref 30.0–36.0)
MCV: 86.3 fL (ref 78.0–100.0)
Platelets: 243 10*3/uL (ref 150–400)
RBC: 3.51 MIL/uL — AB (ref 4.22–5.81)
RDW: 13.5 % (ref 11.5–15.5)
WBC: 11.4 10*3/uL — AB (ref 4.0–10.5)

## 2013-11-19 LAB — TROPONIN I: Troponin I: <0.3 ng/mL (ref <0.30)

## 2013-11-19 LAB — D-DIMER, QUANTITATIVE (NOT AT ARMC): D DIMER QUANT: 2 ug{FEU}/mL — AB (ref 0.00–0.48)

## 2013-11-19 MED ORDER — GI COCKTAIL ~~LOC~~
30.0000 mL | Freq: Three times a day (TID) | ORAL | Status: DC | PRN
  Filled 2013-11-19: qty 30

## 2013-11-19 MED ORDER — LEVOFLOXACIN 750 MG PO TABS
750.0000 mg | ORAL_TABLET | Freq: Every day | ORAL | Status: DC
  Administered 2013-11-20: 750 mg via ORAL
  Filled 2013-11-19 (×2): qty 1

## 2013-11-19 NOTE — Progress Notes (Signed)
Utilization review completed.  

## 2013-11-19 NOTE — Progress Notes (Addendum)
Patient ID: [name] [name], male   DOB: 1946-06-28, 68 y.o.   MRN: [id number] PATIENT ID: [name] [name]  MRN: [id number]  DOB/AGE:  07-Jun-1946 / 68 y.o.  3 Days Post-Op Procedure(s) (LRB): TOTAL KNEE ARTHROPLASTY (Right)    PROGRESS NOTE Subjective: Patient is alert, oriented, Reports tightness in the epigastric region when he tries to swallow but the food does go through.  He did have an episode of colitis.  Prior to this admission, but this is different.  He denies any specific shortness of breath.  He is had hardened difficulties in the past, but says this pain is different.  Reglan given last night helped a little bit for a short period of time.  He denies any specific abdominal pain., no Vomiting, yes passing gas,He is had a bowel movement it appeared normal and is able to take food, but it does seem to hang up just above his stomach. Denies SOB, Chest or Calf Pain. Using Incentive Spirometer, PAS in place. Ambulate He is ambulating down the hall without difficulty, CPM CPM is from 0 to almost 90. Patient reports pain as 3 on 0-10 scale . Chest x-ray does not show any evidence of pneumonia, blood cultures are negative from yesterday, urinalysis is also nondiagnostic. .    Objective: Vital signs in last 24 hours: Filed Vitals:   11/18/13 1104 11/18/13 1733 11/18/13 2056 11/19/13 0603  BP: 132/55 136/77 138/69 112/64  Pulse: 85 94 86 107  Temp:  100.3 F (37.9 C) 97.8 F (36.6 C) 100.7 F (38.2 C)  TempSrc:  Oral Oral Oral  Resp:  18 18 18   SpO2:  95% 94% 97%      Intake/Output from previous day: I/O last 3 completed shifts: In: 52 [P.O.:720] Out: 1700 [Urine:1700]   Intake/Output this shift:     LABORATORY DATA:  Recent Labs  11/18/13 0505 11/19/13 0422  WBC 12.9* 11.4*  HGB 10.3* 10.1*  HCT 31.0* 30.3*  PLT 229 243    Examination: Neurologically intact ABD soft Neurovascular intact Sensation intact distally Intact pulses distally Dorsiflexion/Plantar  flexion intact Incision: no drainage No cellulitis present Compartment soft}  Assessment:   3 Days Post-Op Procedure(s) (LRB): TOTAL KNEE ARTHROPLASTY (Right) ADDITIONAL DIAGNOSIS:  Hypertension,History of colitis episode last month, history of GERD, history of coronary artery disease.  Plan: PT/OT WBAT, CPM 5/hrs day until ROM 0-90 degrees, then D/C CPM,Consult to the hospitalist for evaluation of his epigastric pain, he may need a GI consult as well, but I will leave that up to the hospitalist.  I did speak with Dr. Hartford Poli  this morning who will be seeing the patient. DVT Prophylaxis:  SCDx72hrs, ASA 325 mg BID x 2 weeks DISCHARGE PLAN: Home DISCHARGE NEEDS: HHPT, HHRN, CPM, Walker and 3-in-1 comode seat     Evola Hollis J 11/19/2013, 9:10 AM

## 2013-11-19 NOTE — Progress Notes (Signed)
Orthopedic Tech Progress Note Patient Details:  [name] [name] November 11, 1945 [id number] Patient working with PT at this time. PT to put patient in CPM after treatment. Patient ID: [name], male   DOB: 1946-08-25, 68 y.o.   MRN: [id number]   Fenton Foy 11/19/2013, 3:14 PM

## 2013-11-19 NOTE — Progress Notes (Signed)
Physical Therapy Treatment Patient Details Name: [name] [name] MRN: [id number] DOB: 25-Jan-1946 Today's Date: 11/19/2013 Time: 8315-1761 PT Time Calculation (min): 29 min  PT Assessment / Plan / Recommendation  History of Present Illness R TKA   PT Comments   Pt progressing towards physical therapy goals. Throughout session pt complaining of continued tightness in upper chest and difficulty swallowing food/water which has been present for most of hospital stay. RN aware. Will continue to follow.  Follow Up Recommendations  Home health PT     Does the patient have the potential to tolerate intense rehabilitation     Barriers to Discharge        Equipment Recommendations  Rolling walker with 5" wheels    Recommendations for Other Services    Frequency 7X/week   Progress towards PT Goals    Plan Current plan remains appropriate    Precautions / Restrictions Precautions Precautions: Knee;Fall Precaution Comments: Discussed towel roll under heel and NO pillow under knee.  Restrictions Weight Bearing Restrictions: Yes RLE Weight Bearing: Weight bearing as tolerated   Pertinent Vitals/Pain Pt reports moderate pain however states it is improved from yesterday. Pt repositioned for optimal knee extension at end of session.      Mobility  Bed Mobility General bed mobility comments: Pt received sitting up in recliner.  Transfers Overall transfer level: Needs assistance Equipment used: Rolling walker (2 wheeled) Transfers: Sit to/from Stand Sit to Stand: Supervision General transfer comment: Pt demonstrated proper hand placement and safety awareness.  Ambulation/Gait Ambulation/Gait assistance: Min guard;Supervision Ambulation Distance (Feet): 200 Feet Assistive device: Rolling walker (2 wheeled) Gait Pattern/deviations: Step-through pattern;Decreased stride length Gait velocity: Decreased Gait velocity interpretation: Below normal speed for age/gender General Gait Details:  2 standing rest breaks throughout gait training due to pain/fatigue. Improved technique due to decreased pain compared to morning session.  Stairs: Yes Stairs assistance: Min guard Stair Management: One rail Right;Sideways Number of Stairs: 4 General stair comments: Pt able to negotiate 4 steps with bilateral UE's supported on R railing. VC's for sequencing and safety awareness.     Exercises Total Joint Exercises Ankle Circles/Pumps: 10 reps Quad Sets: 10 reps Heel Slides: 10 reps Hip ABduction/ADduction: 10 reps Long Arc Quad: 10 reps Goniometric ROM: 93 AROM   PT Diagnosis:    PT Problem List:   PT Treatment Interventions:     PT Goals (current goals can now be found in the care plan section) Acute Rehab PT Goals Patient Stated Goal: Home with spouse assist PRN PT Goal Formulation: With patient/family Time For Goal Achievement: 11/23/13 Potential to Achieve Goals: Good  Visit Information  Last PT Received On: 11/19/13 Assistance Needed: +1 History of Present Illness: R TKA    Subjective Data  Subjective: "I thought I'd be home by now." Patient Stated Goal: Home with spouse assist PRN   Cognition  Cognition Arousal/Alertness: Awake/alert Behavior During Therapy: WFL for tasks assessed/performed Overall Cognitive Status: Within Functional Limits for tasks assessed    Balance  Balance Overall balance assessment: No apparent balance deficits (not formally assessed)  End of Session PT - End of Session Equipment Utilized During Treatment: Gait belt Activity Tolerance: Patient limited by pain Patient left: in chair;with call bell/phone within reach;with family/visitor present Nurse Communication: Mobility status;Weight bearing status   GP     Jolyn Lent 11/19/2013, 1:40 PM  Jolyn Lent, PT, DPT Acute Rehabilitation Services Pager: [phone]

## 2013-11-19 NOTE — Consult Note (Signed)
[facility] Medical Consultation  [name] [name] TIR:[id number] DOB: May 03, 1946 DOA: 11/16/2013 PCP: [name] Lair, MD   Requesting physician: [name] [name] Date of consultation: 11/19/2013 Reason for consultation: Odynophagia  Impression/Recommendations Active Problems:   GERD   Chest pain   Unspecified essential hypertension   Arthritis of right knee    Difficulty with swallowing -Patient mentions foot stuck in the lower chest with pain. -Esophagogram was done and showed no evidence of achalasia or tumors. -Patient was on Fosamax, which is well known to cause pill esophagitis. -Started on GI cocktail, omeprazole already increased to 80 mg daily. -I recommend to increase omeprazole to 40 mg twice a day on discharge, followup with GI as outpatient.  Low-grade fever -Patient developed fever of 100.7 today, denies any specific complaints. -Had urinalysis and chest x-ray done yesterday for the same thing and they were clear. -I will treat empirically with Levaquin 750 mg for 5 days.  Chest pain -History of CAD, noncardiac chest pain likely secondary to esophageal irritation. -Patient mentioned pain comes with swallowing, cephalogram showed no tumors or achalasia. -Negative troponin, I will check d-dimer because of recent orthopedic surgery, if positive check CT angio.  Patient can be discharged in a.m., followup with GI as outpatient.  I will followup again tomorrow. Please contact me if I can be of assistance in the meanwhile. Thank you for this consultation.  Chief Complaint: Difficulty with swallowing  HPI:  [name] [name] is 68 years old male with past medical history of CAD and arthritis, admitted for elective right knee replacement. The surgery went uneventful, patient developed difficulty with swallowing since yesterday, has been recently on Fosamax for the past 3 months (discontinued on 2/28), patient also has low-grade fever since yesterday.  Review of Systems:   Constitutional: negative for anorexia, fevers and sweats Eyes: negative for irritation, redness and visual disturbance Ears, nose, mouth, throat, and face: negative for earaches, epistaxis, nasal congestion and sore throat Respiratory: negative for cough, dyspnea on exertion, sputum and wheezing Cardiovascular: negative for chest pain, dyspnea, lower extremity edema, orthopnea, palpitations and syncope Gastrointestinal: Pain with swallowing at the GE junction, constipation, diarrhea, melena, nausea and vomiting Genitourinary:negative for dysuria, frequency and hematuria Hematologic/lymphatic: negative for bleeding, easy bruising and lymphadenopathy Musculoskeletal:negative for arthralgias, muscle weakness and stiff joints Neurological: negative for coordination problems, gait problems, headaches and weakness Endocrine: negative for diabetic symptoms including polydipsia, polyuria and weight loss Allergic/Immunologic: negative for anaphylaxis, hay fever and urticaria   Past Medical History  Diagnosis Date  . Hypertension     takes Losartan and Metoprolol daily  . Hyperlipidemia     takes Simvastatin nightly  . GERD (gastroesophageal reflux disease)     takes Omeprazole daily  . Coronary artery disease   . Myocardial infarct 2004  . Shortness of breath   . Asthma     with exertion  . History of bronchitis     many yrs ago  . URI (upper respiratory infection)     Dec 31,2014  . Vertigo     takes Meclizine daily as needed  . Arthritis   . Joint pain   . Back pain   . Paget disease of bone     takes Fosamax daily  . Hemorrhoids   . Colitis     just finished one antibiotic and Cipro will be finished up  . Diverticulosis   . History of colon polyps   . Anemia   . Cataracts, bilateral     immature  .  COPD (chronic obstructive pulmonary disease)     no meds required   Past Surgical History  Procedure Laterality Date  . Dental surgery    . Appendectomy    . Carpal tunnel  release Right   . Abdominal exploration surgery    . Hernia repair      inguinal  . Joint replacement Left   . Bilateral knee arthroscopies      x 3  . Back surgery      x 2  . Coronary angioplasty      3 stents  . Colonoscopy    . Esophagogastroduodenoscopy    . Total knee arthroplasty Right 11/16/2013    DR Mayer Camel  . Total knee arthroplasty Right 11/16/2013    Procedure: TOTAL KNEE ARTHROPLASTY;  Surgeon: Kerin Salen, MD;  Location: [facility];  Service: Orthopedics;  Laterality: Right;   Social History:  reports that he quit smoking about 15 years ago. He has never used smokeless tobacco. He reports that he does not drink alcohol or use illicit drugs.  Allergies  Allergen Reactions  . Codeine Nausea And Vomiting   History reviewed. No pertinent family history.  Prior to Admission medications   Medication Sig Start Date End Date Taking? Authorizing Provider  alendronate (FOSAMAX) 10 MG tablet Take 40 mg by mouth daily.  08/12/13  Yes Historical Provider, MD  cholecalciferol (VITAMIN D) 400 UNITS TABS tablet Take 400 Units by mouth daily.   Yes Historical Provider, MD  losartan (COZAAR) 50 MG tablet Take 50 mg by mouth daily.   Yes Historical Provider, MD  metoprolol tartrate (LOPRESSOR) 25 MG tablet Take 0.5 tablets (12.5 mg total) by mouth 2 (two) times daily. 10/14/13  Yes Arnoldo Lenis, MD  nitroGLYCERIN (NITROSTAT) 0.4 MG SL tablet Place 1 tablet (0.4 mg total) under the tongue every 5 (five) minutes as needed. Chest pain 08/06/12  Yes Renella Cunas, MD  omeprazole (PRILOSEC) 40 MG capsule Take 40 mg by mouth daily.   Yes Historical Provider, MD  simvastatin (ZOCOR) 40 MG tablet Take 40 mg by mouth at bedtime.   Yes Historical Provider, MD  aspirin EC 325 MG tablet Take 1 tablet (325 mg total) by mouth 2 (two) times daily. 11/16/13   Leighton Parody, PA-C  methocarbamol (ROBAXIN) 500 MG tablet Take 1 tablet (500 mg total) by mouth 2 (two) times daily with a meal. 11/16/13   Leighton Parody, PA-C  oxyCODONE-acetaminophen (ROXICET) 5-325 MG per tablet Take 1 tablet by mouth every 4 (four) hours as needed. 11/16/13   Leighton Parody, PA-C   Physical Exam: Blood pressure 112/64, pulse 107, temperature 100.7 F (38.2 C), temperature source Oral, resp. rate 18, SpO2 97.00%. Filed Vitals:   11/19/13 0603  BP: 112/64  Pulse: 107  Temp: 100.7 F (38.2 C)  Resp: 18  General appearance: alert, cooperative and no distress  Head: Normocephalic, without obvious abnormality, atraumatic  Eyes: conjunctivae/corneas clear. PERRL, EOM's intact. Fundi benign.  Nose: Nares normal. Septum midline. Mucosa normal. No drainage or sinus tenderness.  Throat: lips, mucosa, and tongue normal; teeth and gums normal  Neck: Supple, no masses, no cervical lymphadenopathy, no JVD appreciated, no meningeal signs Resp: clear to auscultation bilaterally  Chest wall: no tenderness  Cardio: regular rate and rhythm, S1, S2 normal, no murmur, click, rub or gallop  GI: soft, non-tender; bowel sounds normal; no masses, no organomegaly  Extremities: extremities normal, atraumatic, no cyanosis or edema  Skin: Skin color,  texture, turgor normal. No rashes or lesions  Neurologic: Alert and oriented X 3, normal strength and tone. Normal symmetric reflexes. Normal coordination and gait   Labs on Admission:  Basic Metabolic Panel: No results found for this basename: NA, K, CL, CO2, GLUCOSE, BUN, CREATININE, CALCIUM, MG, PHOS,  in the last 168 hours Liver Function Tests: No results found for this basename: AST, ALT, ALKPHOS, BILITOT, PROT, ALBUMIN,  in the last 168 hours No results found for this basename: LIPASE, AMYLASE,  in the last 168 hours No results found for this basename: AMMONIA,  in the last 168 hours CBC:  Recent Labs Lab 11/17/13 0355 11/18/13 0505 11/19/13 0422  WBC 14.0* 12.9* 11.4*  HGB 10.5* 10.3* 10.1*  HCT 31.1* 31.0* 30.3*  MCV 86.4 86.8 86.3  PLT 234 229 243   Cardiac  Enzymes:  Recent Labs Lab 11/19/13 1115  TROPONINI <0.30   BNP: No components found with this basename: POCBNP,  CBG: No results found for this basename: GLUCAP,  in the last 168 hours  Radiological Exams on Admission: Dg Chest 2 View  11/19/2013   CLINICAL DATA:  Fever and cough, rule out pneumonia  EXAM: CHEST  2 VIEW  COMPARISON:  11/09/2013  FINDINGS: Heart size and vascularity are normal. Negative for heart failure. Negative for pneumonia or effusion. Small hiatal hernia noted.  IMPRESSION: No active cardiopulmonary disease.   Electronically Signed   By: Franchot Gallo M.D.   On: 11/19/2013 07:19   Dg Esophagus  11/19/2013   CLINICAL DATA:  Achalasia.  EXAM: ESOPHOGRAM/BARIUM SWALLOW  TECHNIQUE: Single contrast examination was performed using  thin barium.  COMPARISON:  None.  FLUOROSCOPY TIME:  1 min 12 seconds  FINDINGS: The esophagus has a patent course. No high-grade stricture mass identified. The patient ingested a 13 mm barium tablet which passed through the esophagus and into the stomach. A small hiatal hernia was visualized.  IMPRESSION: Patent esophagus.  No high-grade stricture mass.   Electronically Signed   By: Kerby Moors M.D.   On: 11/19/2013 14:50    EKG: Independently reviewed.   Time spent: 70 minutes  [facility] Pager [phone]  If 7PM-7AM, please contact night-coverage [email or web address] Password [email or web address] 11/19/2013, 4:38 PM

## 2013-11-19 NOTE — Progress Notes (Signed)
Physical Therapy Treatment Patient Details Name: [name] [name] MRN: [id number] DOB: December 17, 1945 Today's Date: 11/19/2013 Time: 5366-4403 PT Time Calculation (min): 21 min  PT Assessment / Plan / Recommendation  History of Present Illness R TKA   PT Comments   Pt continues to progress towards physical therapy goals. Gait speed continues to be moderately decreased, however improvements are demonstrated in technique with each session. Pt reports performing HEP between sessions. Will continue to follow.   Follow Up Recommendations  Home health PT     Does the patient have the potential to tolerate intense rehabilitation     Barriers to Discharge        Equipment Recommendations  Rolling walker with 5" wheels    Recommendations for Other Services    Frequency 7X/week   Progress towards PT Goals Progress towards PT goals: Progressing toward goals  Plan Current plan remains appropriate    Precautions / Restrictions Precautions Precautions: Knee;Fall Precaution Comments: Discussed towel roll under heel and NO pillow under knee.  Restrictions Weight Bearing Restrictions: Yes RLE Weight Bearing: Weight bearing as tolerated   Pertinent Vitals/Pain RN reports he has not had pain medication all day, and pt declined pain medication during session.     Mobility  Bed Mobility Overal bed mobility: Needs Assistance Bed Mobility: Supine to Sit;Sit to Supine Supine to sit: HOB elevated;Supervision (slightly) Sit to supine: Supervision General bed mobility comments: Pt able to transition to/from EOB with self-assist for RLE movement and support.  Transfers Overall transfer level: Needs assistance Equipment used: Rolling walker (2 wheeled) Transfers: Sit to/from Stand Sit to Stand: Supervision General transfer comment: Pt demonstrated proper hand placement and safety awareness.  Ambulation/Gait Ambulation/Gait assistance: Supervision Ambulation Distance (Feet): 200 Feet Assistive  device: Rolling walker (2 wheeled) Gait Pattern/deviations: Step-through pattern;Decreased stride length Gait velocity: Decreased Gait velocity interpretation: Below normal speed for age/gender General Gait Details: Pt able to improve gait technique with VC's but had difficulty increasing gait speed, which continues to be very slow.  Stairs: Yes Stairs assistance: Min guard Stair Management: One rail Right;Sideways Number of Stairs: 4 General stair comments: Pt able to negotiate 4 steps with bilateral UE's supported on R railing. VC's for sequencing and safety awareness.     Exercises Total Joint Exercises Ankle Circles/Pumps: 10 reps Quad Sets: 10 reps Heel Slides: 10 reps Hip ABduction/ADduction: 10 reps Long Arc Quad: 10 reps Goniometric ROM: 93 AROM   PT Diagnosis:    PT Problem List:   PT Treatment Interventions:     PT Goals (current goals can now be found in the care plan section) Acute Rehab PT Goals Patient Stated Goal: Home with spouse assist PRN PT Goal Formulation: With patient/family Time For Goal Achievement: 11/23/13 Potential to Achieve Goals: Good  Visit Information  Last PT Received On: 11/19/13 Assistance Needed: +1 History of Present Illness: R TKA    Subjective Data  Subjective: "They might keep me another night." Patient Stated Goal: Home with spouse assist PRN   Cognition  Cognition Arousal/Alertness: Awake/alert Behavior During Therapy: WFL for tasks assessed/performed Overall Cognitive Status: Within Functional Limits for tasks assessed    Balance  Balance Overall balance assessment: No apparent balance deficits (not formally assessed) General Comments General comments (skin integrity, edema, etc.): CPM donned at end of session, angle set at 80. Therapeutic exercise deferred as pt reports he is performing HEP in between sessions.   End of Session PT - End of Session Equipment Utilized During Treatment: Gait belt  Activity Tolerance:  Patient limited by pain Patient left: in chair;with call bell/phone within reach;with family/visitor present Nurse Communication: Mobility status   GP     Jolyn Lent 11/19/2013, 3:33 PM  Jolyn Lent, PT, DPT Acute Rehabilitation Services Pager: [phone]

## 2013-11-20 MED ORDER — LEVOFLOXACIN 750 MG PO TABS: 750.0000 mg | ORAL_TABLET | Freq: Every day | ORAL | Status: DC

## 2013-11-20 MED ORDER — OMEPRAZOLE 40 MG PO CPDR: 40.0000 mg | DELAYED_RELEASE_CAPSULE | Freq: Two times a day (BID) | ORAL | Status: DC

## 2013-11-20 NOTE — Plan of Care (Signed)
Problem: Consults Goal: Diagnosis- Total Joint Replacement Outcome: Completed/Met Date Met:  11/20/13 Primary Total Knee

## 2013-11-20 NOTE — Progress Notes (Signed)
PT Cancellation Note  Patient Details Name: [name] [name] MRN: [id number] DOB: 01/05/1946   Cancelled Treatment:    Reason Eval/Treat Not Completed: Per RN, pt ready for d/c. Therapist discussed d/c with pt, and he and wife feel comfortable returning home and negotiating the car transfer and steps to enter. No further needs identified, will sign off.   Jolyn Lent 11/20/2013, 11:53 AM  Jolyn Lent, PT, DPT Acute Rehabilitation Services Pager: [phone]

## 2013-11-20 NOTE — Progress Notes (Signed)
PATIENT ID: [name] [name]  MRN: [id number]  DOB/AGE:  70-Dec-1947 / 70 y.o.  4 Days Post-Op Procedure(s) (LRB): TOTAL KNEE ARTHROPLASTY (Right)    PROGRESS NOTE Subjective: Patient is alert, oriented, no Nausea, no Vomiting, yes passing gas, yes Bowel Movement. Taking PO well. Denies SOB, Chest or Calf Pain. Using Incentive Spirometer, PAS in place. Ambulate WBAT, CPM 0-60 Patient reports pain as moderate  .    Objective: Vital signs in last 24 hours: Filed Vitals:   11/19/13 0603 11/19/13 1400 11/19/13 2034 11/20/13 0605  BP: 112/64 120/55 137/56 125/58  Pulse: 107 76 88 81  Temp: 100.7 F (38.2 C) 98 F (36.7 C) 98.4 F (36.9 C) 98.3 F (36.8 C)  TempSrc: Oral   Oral  Resp: 18 18 20 18   SpO2: 97% 98% 94% 94%      Intake/Output from previous day: I/O last 3 completed shifts: In: 1200 [P.O.:1200] Out: 1200 [Urine:1200]   Intake/Output this shift:     LABORATORY DATA:  Recent Labs  11/18/13 0505 11/19/13 0422  WBC 12.9* 11.4*  HGB 10.3* 10.1*  HCT 31.0* 30.3*  PLT 229 243    Examination: Neurologically intact Neurovascular intact Sensation intact distally Intact pulses distally Dorsiflexion/Plantar flexion intact Incision: dressing C/D/I No cellulitis present Compartment soft}  Assessment:   4 Days Post-Op Procedure(s) (LRB): TOTAL KNEE ARTHROPLASTY (Right) ADDITIONAL DIAGNOSIS:  Hypertension,History of colitis episode last month, history of GERD, history of coronary artery disease.  Plan: PT/OT WBAT, CPM 5/hrs day until ROM 0-90 degrees, then D/C CPM DVT Prophylaxis:  SCDx72hrs, ASA 325 mg BID x 2 weeks DISCHARGE PLAN: Home today DISCHARGE NEEDS: HHPT, HHRN, CPM, Walker and 3-in-1 comode seat  Plan to d/c on Levaquin 750 mg for 5 days and increase omeprazole to 40 mg BID per internal medicine     Verta Riedlinger R 11/20/2013, 10:23 AM

## 2013-11-20 NOTE — Discharge Summary (Signed)
Patient ID: [name] [name] MRN: [id number] DOB/AGE: August 16, 1946 68 y.o.  Admit date: 11/16/2013 Discharge date: 11/20/2013  Admission Diagnoses:  Active Problems:   GERD   Chest pain   Unspecified essential hypertension   Arthritis of right knee   Discharge Diagnoses:  Same  Past Medical History  Diagnosis Date  . Hypertension     takes Losartan and Metoprolol daily  . Hyperlipidemia     takes Simvastatin nightly  . GERD (gastroesophageal reflux disease)     takes Omeprazole daily  . Coronary artery disease   . Myocardial infarct 2004  . Shortness of breath   . Asthma     with exertion  . History of bronchitis     many yrs ago  . URI (upper respiratory infection)     Dec 31,2014  . Vertigo     takes Meclizine daily as needed  . Arthritis   . Joint pain   . Back pain   . Paget disease of bone     takes Fosamax daily  . Hemorrhoids   . Colitis     just finished one antibiotic and Cipro will be finished up  . Diverticulosis   . History of colon polyps   . Anemia   . Cataracts, bilateral     immature  . COPD (chronic obstructive pulmonary disease)     no meds required    Surgeries: Procedure(s): TOTAL KNEE ARTHROPLASTY on 11/16/2013   Consultants:    Discharged Condition: Improved  Hospital Course: [name] [name] is an 68 y.o. male who was admitted 11/16/2013 for operative treatment of<principal problem not specified>. Patient has severe unremitting pain that affects sleep, daily activities, and work/hobbies. After pre-op clearance the patient was taken to the operating room on 11/16/2013 and underwent  Procedure(s): TOTAL KNEE ARTHROPLASTY.    Patient was given perioperative antibiotics: Anti-infectives   Start     Dose/Rate Route Frequency Ordered Stop   11/20/13 0000  levofloxacin (LEVAQUIN) 750 MG tablet     750 mg Oral Daily 11/20/13 1028     11/19/13 1800  levofloxacin (LEVAQUIN) tablet 750 mg     750 mg Oral Daily 11/19/13 1650     11/16/13 0757   cefUROXime (ZINACEF) injection  Status:  Discontinued       As needed 11/16/13 0757 11/16/13 0921   11/16/13 0600  ceFAZolin (ANCEF) IVPB 2 g/50 mL premix     2 g 100 mL/hr over 30 Minutes Intravenous On call to O.R. 11/15/13 1300 11/16/13 0743       Patient was given sequential compression devices, early ambulation, and chemoprophylaxis to prevent DVT.  Patient benefited maximally from hospital stay and there were no complications.    Recent vital signs: Patient Vitals for the past 24 hrs:  BP Temp Temp src Pulse Resp SpO2  11/20/13 0605 125/58 mmHg 98.3 F (36.8 C) Oral 81 18 94 %  11/19/13 2034 137/56 mmHg 98.4 F (36.9 C) - 88 20 94 %  11/19/13 1400 120/55 mmHg 98 F (36.7 C) - 76 18 98 %     Recent laboratory studies:  Recent Labs  11/18/13 0505 11/19/13 0422  WBC 12.9* 11.4*  HGB 10.3* 10.1*  HCT 31.0* 30.3*  PLT 229 243     Discharge Medications:     Medication List    STOP taking these medications       aspirin 81 MG tablet  Replaced by:  aspirin EC 325 MG tablet  TAKE these medications       alendronate 10 MG tablet  Commonly known as:  FOSAMAX  Take 40 mg by mouth daily.     aspirin EC 325 MG tablet  Take 1 tablet (325 mg total) by mouth 2 (two) times daily.     cholecalciferol 400 UNITS Tabs tablet  Commonly known as:  VITAMIN D  Take 400 Units by mouth daily.     levofloxacin 750 MG tablet  Commonly known as:  LEVAQUIN  Take 1 tablet (750 mg total) by mouth daily.     losartan 50 MG tablet  Commonly known as:  COZAAR  Take 50 mg by mouth daily.     methocarbamol 500 MG tablet  Commonly known as:  ROBAXIN  Take 1 tablet (500 mg total) by mouth 2 (two) times daily with a meal.     metoprolol tartrate 25 MG tablet  Commonly known as:  LOPRESSOR  Take 0.5 tablets (12.5 mg total) by mouth 2 (two) times daily.     nitroGLYCERIN 0.4 MG SL tablet  Commonly known as:  NITROSTAT  Place 1 tablet (0.4 mg total) under the tongue every 5  (five) minutes as needed. Chest pain     omeprazole 40 MG capsule  Commonly known as:  PRILOSEC  Take 1 capsule (40 mg total) by mouth 2 (two) times daily.     oxyCODONE-acetaminophen 5-325 MG per tablet  Commonly known as:  ROXICET  Take 1 tablet by mouth every 4 (four) hours as needed.     simvastatin 40 MG tablet  Commonly known as:  ZOCOR  Take 40 mg by mouth at bedtime.        Diagnostic Studies: Dg Chest 2 View  11/19/2013   CLINICAL DATA:  Fever and cough, rule out pneumonia  EXAM: CHEST  2 VIEW  COMPARISON:  11/09/2013  FINDINGS: Heart size and vascularity are normal. Negative for heart failure. Negative for pneumonia or effusion. Small hiatal hernia noted.  IMPRESSION: No active cardiopulmonary disease.   Electronically Signed   By: Franchot Gallo M.D.   On: 11/19/2013 07:19   Dg Chest 2 View  11/09/2013   CLINICAL DATA:  Hypertension.  Knee replacement.  EXAM: CHEST  2 VIEW  COMPARISON:  Chest x-ray 08/27/2011  FINDINGS: Mediastinum and hilar structures are normal. Calcified pulmonary nodules in the right again noted consistent with granulomas. Lungs are clear of infiltrates. Stable chronic interstitial prominence consistent with interstitial fibrosis noted. No pleural effusion or pneumothorax. No acute osseous abnormality. Degenerative changes thoracic spine.  IMPRESSION: No active cardiopulmonary disease. Evidence of stable chronic interstitial lung disease with stable granulomas.   Electronically Signed   By: Marcello Moores  Register   On: 11/09/2013 15:39   Dg Esophagus  11/19/2013   CLINICAL DATA:  Achalasia.  EXAM: ESOPHOGRAM/BARIUM SWALLOW  TECHNIQUE: Single contrast examination was performed using  thin barium.  COMPARISON:  None.  FLUOROSCOPY TIME:  1 min 12 seconds  FINDINGS: The esophagus has a patent course. No high-grade stricture mass identified. The patient ingested a 13 mm barium tablet which passed through the esophagus and into the stomach. A small hiatal hernia was  visualized.  IMPRESSION: Patent esophagus.  No high-grade stricture mass.   Electronically Signed   By: Kerby Moors M.D.   On: 11/19/2013 14:50    Disposition: 01-Home or Self Care      Discharge Orders   Future Orders Complete By Expires   Call MD / Call 911  As directed    Comments:     If you experience chest pain or shortness of breath, CALL 911 and be transported to the hospital emergency room.  If you develope a fever above 101 F, pus (white drainage) or increased drainage or redness at the wound, or calf pain, call your surgeon's office.   Change dressing  As directed    Comments:     Change dressing on 5, then change the dressing daily with sterile 4 x 4 inch gauze dressing and apply TED hose.  You may clean the incision with alcohol prior to redressing.   Constipation Prevention  As directed    Comments:     Drink plenty of fluids.  Prune juice may be helpful.  You may use a stool softener, such as Colace (over the counter) 100 mg twice a day.  Use MiraLax (over the counter) for constipation as needed.   CPM  As directed    Comments:     Continuous passive motion machine (CPM):      Use the CPM from 0 to 60  for 5 hours per day.      You may increase by 10 degrees per day.  You may break it up into 2 or 3 sessions per day.      Use CPM for 2 weeks or until you are told to stop.   Diet - low sodium heart healthy  As directed    Discharge instructions  As directed    Comments:     Follow up in office with Dr. Mayer Camel in 2 weeks   Driving restrictions  As directed    Comments:     No driving for 2 weeks   Increase activity slowly as tolerated  As directed    Patient may shower  As directed    Comments:     You may shower without a dressing once there is no drainage.  Do not wash over the wound.  If drainage remains, cover wound with plastic wrap and then shower.      Follow-up Information   Follow up with Kerin Salen, MD In 2 weeks.   Specialty:  Orthopedic Surgery    Contact information:   [place] [address] [phone]        Signed: Hardin Negus Windell Musson R 11/20/2013, 10:29 AM

## 2013-11-24 LAB — CULTURE, BLOOD (ROUTINE X 2)
CULTURE: NO GROWTH
Culture: NO GROWTH

## 2013-12-02 ENCOUNTER — Other Ambulatory Visit: Payer: Self-pay | Admitting: Cardiology

## 2014-04-28 ENCOUNTER — Telehealth: Payer: Self-pay | Admitting: Cardiology

## 2014-04-28 MED ORDER — METOPROLOL TARTRATE 25 MG PO TABS: 12.5000 mg | ORAL_TABLET | Freq: Two times a day (BID) | ORAL | Status: DC

## 2014-04-28 NOTE — Telephone Encounter (Signed)
Received fax refill request  Rx # [id number] Medication:  Metoprolol Tartrate 25 mg tab Qty 90 Sig:  Take 1/2 tablet by mouth twice daily Physician:  Harl Bowie

## 2014-04-28 NOTE — Telephone Encounter (Signed)
Refill request complete 

## 2014-07-02 ENCOUNTER — Other Ambulatory Visit: Payer: Self-pay

## 2014-08-25 ENCOUNTER — Encounter (HOSPITAL_COMMUNITY): Payer: Self-pay | Admitting: Cardiology

## 2014-08-31 ENCOUNTER — Other Ambulatory Visit (HOSPITAL_COMMUNITY): Payer: Self-pay | Admitting: Interventional Radiology

## 2014-08-31 DIAGNOSIS — M881 Osteitis deformans of vertebrae: Secondary | ICD-10-CM

## 2014-08-31 DIAGNOSIS — M5416 Radiculopathy, lumbar region: Secondary | ICD-10-CM

## 2014-09-13 ENCOUNTER — Ambulatory Visit (HOSPITAL_COMMUNITY)
Admission: RE | Admit: 2014-09-13 | Discharge: 2014-09-13 | Disposition: A | Discharge status: Home / Self Care | Payer: Medicare Other | Source: Ambulatory Visit | Attending: Interventional Radiology | Admitting: Interventional Radiology

## 2014-09-13 ENCOUNTER — Other Ambulatory Visit (HOSPITAL_COMMUNITY): Payer: Self-pay | Admitting: Interventional Radiology

## 2014-09-13 DIAGNOSIS — M25552 Pain in left hip: Secondary | ICD-10-CM | POA: Insufficient documentation

## 2014-09-13 DIAGNOSIS — M5416 Radiculopathy, lumbar region: Secondary | ICD-10-CM | POA: Insufficient documentation

## 2014-09-13 DIAGNOSIS — M4806 Spinal stenosis, lumbar region: Secondary | ICD-10-CM | POA: Insufficient documentation

## 2014-09-13 DIAGNOSIS — M881 Osteitis deformans of vertebrae: Secondary | ICD-10-CM

## 2014-09-13 DIAGNOSIS — M25551 Pain in right hip: Secondary | ICD-10-CM | POA: Insufficient documentation

## 2014-10-05 ENCOUNTER — Ambulatory Visit (INDEPENDENT_AMBULATORY_CARE_PROVIDER_SITE_OTHER): Payer: Medicare Other | Admitting: Cardiology

## 2014-10-05 ENCOUNTER — Encounter: Payer: Self-pay | Admitting: Cardiology

## 2014-10-05 VITALS — BP 138/73 | HR 65 | Ht 70.0 in | Wt 215.0 lb

## 2014-10-05 DIAGNOSIS — I2510 Atherosclerotic heart disease of native coronary artery without angina pectoris: Secondary | ICD-10-CM | POA: Diagnosis not present

## 2014-10-05 DIAGNOSIS — I10 Essential (primary) hypertension: Secondary | ICD-10-CM

## 2014-10-05 DIAGNOSIS — E785 Hyperlipidemia, unspecified: Secondary | ICD-10-CM

## 2014-10-05 NOTE — Progress Notes (Signed)
Clinical Summary [name] [name] is a 69 y.o.male seen today for follow up of the following medical problems.   1. CAD  - prior STEMI in 2004, received stent to LAD and LCX  - 08/2011 Echo LVEF 50-55%  - Jan 2015 Lexiscan MPI no ischemia  - denies any chest pain. Stable DOE. No LE edema, no orthopnea. - compliant with meds.  2. Hyperlipidemia - followed by pcp  -Reports tried on more potent statins but caused muscle aches.    Past Medical History  Diagnosis Date  . Hypertension     takes Losartan and Metoprolol daily  . Hyperlipidemia     takes Simvastatin nightly  . GERD (gastroesophageal reflux disease)     takes Omeprazole daily  . Coronary artery disease   . Myocardial infarct 2004  . Shortness of breath   . Asthma     with exertion  . History of bronchitis     many yrs ago  . URI (upper respiratory infection)     Dec 31,2014  . Vertigo     takes Meclizine daily as needed  . Arthritis   . Joint pain   . Back pain   . Paget disease of bone     takes Fosamax daily  . Hemorrhoids   . Colitis     just finished one antibiotic and Cipro will be finished up  . Diverticulosis   . History of colon polyps   . Anemia   . Cataracts, bilateral     immature  . COPD (chronic obstructive pulmonary disease)     no meds required     Allergies  Allergen Reactions  . Codeine Nausea And Vomiting     Current Outpatient Prescriptions  Medication Sig Dispense Refill  . alendronate (FOSAMAX) 10 MG tablet Take 40 mg by mouth daily.     Marland Kitchen aspirin EC 325 MG tablet Take 1 tablet (325 mg total) by mouth 2 (two) times daily. 30 tablet 0  . cholecalciferol (VITAMIN D) 400 UNITS TABS tablet Take 400 Units by mouth daily.    Marland Kitchen levofloxacin (LEVAQUIN) 750 MG tablet Take 1 tablet (750 mg total) by mouth daily. 4 tablet 0  . losartan (COZAAR) 50 MG tablet Take 50 mg by mouth daily.    Marland Kitchen losartan (COZAAR) 50 MG tablet TAKE ONE TABLET BY MOUTH DAILY 90 tablet 3  . methocarbamol  (ROBAXIN) 500 MG tablet Take 1 tablet (500 mg total) by mouth 2 (two) times daily with a meal. 60 tablet 0  . metoprolol tartrate (LOPRESSOR) 25 MG tablet Take 0.5 tablets (12.5 mg total) by mouth 2 (two) times daily. 90 tablet 3  . nitroGLYCERIN (NITROSTAT) 0.4 MG SL tablet Place 1 tablet (0.4 mg total) under the tongue every 5 (five) minutes as needed. Chest pain 90 tablet 1  . omeprazole (PRILOSEC) 40 MG capsule Take 1 capsule (40 mg total) by mouth 2 (two) times daily. 60 capsule 0  . oxyCODONE-acetaminophen (ROXICET) 5-325 MG per tablet Take 1 tablet by mouth every 4 (four) hours as needed. 60 tablet 0  . simvastatin (ZOCOR) 40 MG tablet Take 40 mg by mouth at bedtime.    . simvastatin (ZOCOR) 40 MG tablet TAKE ONE TABLET BY MOUTH AT BEDTIME 90 tablet 3   No current facility-administered medications for this visit.     Past Surgical History  Procedure Laterality Date  . Dental surgery    . Appendectomy    . Carpal tunnel release Right   .  Abdominal exploration surgery    . Hernia repair      inguinal  . Joint replacement Left   . Bilateral knee arthroscopies      x 3  . Back surgery      x 2  . Coronary angioplasty      3 stents  . Colonoscopy    . Esophagogastroduodenoscopy    . Total knee arthroplasty Right 11/16/2013    DR [name] [name]  . Total knee arthroplasty Right 11/16/2013    Procedure: TOTAL KNEE ARTHROPLASTY;  Surgeon: [name] Salen, MD;  Location: [place];  Service: Orthopedics;  Laterality: Right;  . Left heart catheterization with coronary angiogram N/A 08/27/2011    Procedure: LEFT HEART CATHETERIZATION WITH CORONARY ANGIOGRAM;  Surgeon: [name] Bow, MD;  Location: [facility] CATH LAB;  Service: Cardiovascular;  Laterality: N/A;     Allergies  Allergen Reactions  . Codeine Nausea And Vomiting      No family history on file.   Social History [name] [name] reports that he quit smoking about 16 years ago. He has never used smokeless tobacco. [name] [name] reports that  he does not drink alcohol.   Review of Systems CONSTITUTIONAL: No weight loss, fever, chills, weakness or fatigue.  HEENT: Eyes: No visual loss, blurred vision, double vision or yellow sclerae.No hearing loss, sneezing, congestion, runny nose or sore throat.  SKIN: No rash or itching.  CARDIOVASCULAR: per HPI RESPIRATORY: No shortness of breath, cough or sputum.  GASTROINTESTINAL: No anorexia, nausea, vomiting or diarrhea. No abdominal pain or blood.  GENITOURINARY: No burning on urination, no polyuria NEUROLOGICAL: No headache, dizziness, syncope, paralysis, ataxia, numbness or tingling in the extremities. No change in bowel or bladder control.  MUSCULOSKELETAL: +back pain LYMPHATICS: No enlarged nodes. No history of splenectomy.  PSYCHIATRIC: No history of depression or anxiety.  ENDOCRINOLOGIC: No reports of sweating, cold or heat intolerance. No polyuria or polydipsia.  Marland Kitchen   Physical Examination p 65 bp 138/73 Wt 215 lbs BMI 31 Gen: resting comfortably, no acute distress HEENT: no scleral icterus, pupils equal round and reactive, no palptable cervical adenopathy,  CV: RRR, no m/r/g, no JVD, no carotid bruits Resp: Clear to auscultation bilaterally GI: abdomen is soft, non-tender, non-distended, normal bowel sounds, no hepatosplenomegaly MSK: extremities are warm, no edema.  Skin: warm, no rash Neuro:  no focal deficits Psych: appropriate affect   Diagnostic Studies 08/2003 Cath  FINDINGS:  1. Left main trunk: Large caliber vessel with mild irregularities.  2. LAD: This begins as a large caliber vessel and tapers in the distal  section. Provides two diagonal branches. The proximal LAD has mild  disease of 30%. The mid LAD has eccentric plaque of 50%. The distal LAD  has a high grade narrowing of 95% medially after the second diagonal  Nusrat Encarnacion with TIMI 2 flow distally. The first diagonal Parissa Chiao has an  ostial narrowing of 50-60%. The second diagonal Rayyan Burley has an  ostial  narrowing of 30-40% and is a large caliber vessel.  3. Left circumflex artery: This is a medium caliber vessel. Provides  trivial first marginal Nealie Mchatton in the mid section and two marginal  branches distally. The proximal AV circumflex has high grade narrowing  of 70-80%. There is then further narrowing of 50-60% after the first  marginal Kayela Humphres. The first marginal Rylin Saez has long tubular narrowing of  50% in the proximal segment. The second marginal Dorothie Wah has an ostial  narrowing of 80%. The third marginal Emer Onnen has proximal narrowing  of  70%.  4. Right coronary artery: Dominant. This is a large caliber vessel.  Provides the posterior descending artery and posterior ventricular Jahdiel Krol  in terminal segment. The right coronary artery has diffuse disease of 30-  40% in the mid and distal section.  5. LV: Normal end-systolic and end-diastolic dimensions. Overall left  ventricular function is well preserved. Ejection fraction greater than  55%. No mitral regurgitation. There is akinesis of a small portion of  the mid anterior wall. LV pressure is 110/10. Aortic is 110/60. LVEDP  equals 15.  DESCRIPTION OF PROCEDURE: With these findings we elected to proceed with  percutaneous intervention to the distal LAD. The patient was given heparin  systemically as well as Reopro to maintain an ACT of approximately 300  seconds using 600 mg of Plavix. During the case he developed some itching  and was given Benadryl and Solu-Medrol with improvement of this. A 6-French  Q4 guide catheter was used to engage the left coronary artery and a 0.014  inch Asahi Prowater wire introduced. This was used to cross the stenosis  and position the distal LAD. A 2.5 x 15 mm Voyager balloon was introduced  and used to dilate the distal LAD lesion at 6 atmospheres for 120 seconds.  Repeat angiography showed an improvement in vessel lumen. However, there  was severe residual plaque and  it was felt that further intervention would  be required. We introduced a 2.75 x 15 mm cutting balloon and a total of  six inflations were performed within the distal LAD at up to 10 atmospheres  for 60 seconds. Repeat angiography showed modest improvement in vessel  lumen. However, in the LAO cranial views there still appeared to be  residual narrowing of 70-80% at the origin of the stenosis. Some plaque  shifting had occurred into the diagonal Levone Otten as well. While we tried to  avoid stent placement, this became unavoidable. We positioned a 2.5 x 18 mm  Cypher stent into the distal LAD and deployed this at 10 atmospheres for 30  seconds. A 2.75 x 15 mm Quantum Maverick balloon was then used to post  dilate the stent. Two inflations were performed up to 14 atmospheres for 30  seconds each. Repeat angiography was then performed after the  administration of intracoronary nitroglycerin showing excellent result with  no residual stenosis in the distal LAD and improvement of TIMI grade 2 to  TIMI grade 3 flow in the distal LAD. There was some plaque shift to the  origin of the second diagonal Oris Staffieri of 70-80%. However, there was still  TIMI 3 flow in this vessel. We decided to accept this result. Final  angiography was performed in various projections showing no distal vessel  damage. The guide catheter was then removed. An AngioSeal closure device  was then deployed to the right femoral artery due to mild oozing around the  sheath. There was persistence of oozing and a FemStop was positioned until  hemostasis was achieved. The patient then transferred to the CCU in stable  condition. He tolerated the procedure well.  FINAL RESULTS: Successful PTCA and stent placement in the distal LAD with  reduction of 95% narrowing to 0% with placement of a 2.5 x 18 mm Cypher drug-  eluting stent dilated to 2.75 mm and improvement of TIMI grade 2 to TIMI  grade 3 flow.   08/2011  Echo  LVEF 78-29%, grade I diastolic dysfunction, difficult study.   09/04/13 EKG  Normal sinus rhythm,  no ischemic changes   Jan 2015 Stress MPI IMPRESSION: Low risk Lexiscan Cardiolite. No diagnostic ST segment changes were noted. Rare PACs observed. Perfusion imaging is consistent with diaphragmatic attenuation affecting the inferior wall, no clear evidence of scar or ischemia. LV volumes are normal and LVEF is 62% without wall motion abnormality.  09/25/14 Clinic EKG SR with PACs    Assessment and Plan   1. CAD  - no current symptoms, continue secondary preventions  2 . Hyperlipidemia - followed by pcp, will request most recent panel - reports muscle aches on more potent statins, continue current dose   F/u 1 year  Arnoldo Lenis, M.D.

## 2014-10-05 NOTE — Patient Instructions (Signed)
Your physician wants you to follow-up in: 1 year with DrBranch You will receive a reminder letter in the mail two months in advance. If you don't receive a letter, please call our office to schedule the follow-up appointment.     Your physician recommends that you continue on your current medications as directed. Please refer to the Current Medication list given to you today.      Thank you for choosing [facility] !        

## 2014-10-14 ENCOUNTER — Encounter: Payer: Self-pay | Admitting: Cardiology

## 2014-11-23 ENCOUNTER — Other Ambulatory Visit: Payer: Self-pay | Admitting: Cardiology

## 2014-11-23 MED ORDER — LOSARTAN POTASSIUM 50 MG PO TABS: 50.0000 mg | ORAL_TABLET | Freq: Every day | ORAL | Status: DC

## 2014-11-23 NOTE — Telephone Encounter (Signed)
Received fax refill request  Rx # [id number] Medication:  Losartan 50 mg tab Qty 90 Sig:  Take one tablet by mouth daily Physician:  Harl Bowie

## 2014-11-23 NOTE — Telephone Encounter (Signed)
escribed refill as requested

## 2014-12-03 ENCOUNTER — Other Ambulatory Visit: Payer: Self-pay | Admitting: Neurological Surgery

## 2014-12-03 ENCOUNTER — Telehealth: Payer: Self-pay | Admitting: *Deleted

## 2014-12-03 NOTE — Telephone Encounter (Signed)
Faxed for surgical clearance pt was seen in jan 2016. Form given to Cathy/tmj

## 2014-12-03 NOTE — Telephone Encounter (Signed)
Given to Dr Harl Bowie today

## 2014-12-21 ENCOUNTER — Other Ambulatory Visit: Payer: Self-pay | Admitting: Cardiology

## 2014-12-21 MED ORDER — SIMVASTATIN 40 MG PO TABS: 40.0000 mg | ORAL_TABLET | Freq: Every day | ORAL | Status: DC

## 2014-12-21 NOTE — Telephone Encounter (Signed)
Refill complete 

## 2014-12-21 NOTE — Telephone Encounter (Signed)
Received fax refill request  Rx # [id number] Medication:  Simvastatin 40 mg tablet Qty 90 Sig:  Take one tablet by mouth at bedtime Physician:  Harl Bowie

## 2014-12-25 NOTE — Pre-Procedure Instructions (Signed)
YORDAN MARTINDALE  12/25/2014   Your procedure is scheduled on:  April 18  Report to [facility] Admitting at 07:54 AM.  Call this number if you have problems the morning of surgery: [phone]   Remember:   Do not eat food or drink liquids after midnight.   Take these medicines the morning of surgery with A SIP OF WATER: Metoprolol, Prilosec   STOP Naproxen, Multiple Vitamin, Vitamin D, Aspirin today   STOP/ Do not take Aspirin, Aleve, Naproxen, Advil, Ibuprofen, Motrin, Vitamins, Herbs, or Supplements starting today   Do not wear jewelry, make-up or nail polish.  Do not wear lotions, powders, or perfumes. You may wear deodorant.  Do not shave 48 hours prior to surgery. Men may shave face and neck.  Do not bring valuables to the hospital.  [facility] is not responsible for any belongings or valuables.               Contacts, dentures or bridgework may not be worn into surgery.  Leave suitcase in the car. After surgery it may be brought to your room.  For patients admitted to the hospital, discharge time is determined by your treatment team.               Special Instructions: [place] - Preparing for Surgery  Before surgery, you can play an important role.  Because skin is not sterile, your skin needs to be as free of germs as possible.  You can reduce the number of germs on you skin by washing with CHG (chlorahexidine gluconate) soap before surgery.  CHG is an antiseptic cleaner which kills germs and bonds with the skin to continue killing germs even after washing.  Please DO NOT use if you have an allergy to CHG or antibacterial soaps.  If your skin becomes reddened/irritated stop using the CHG and inform your nurse when you arrive at Short Stay.  Do not shave (including legs and underarms) for at least 48 hours prior to the first CHG shower.  You may shave your face.  Please follow these instructions carefully:   1.  Shower with CHG Soap the night before surgery  and the morning of Surgery.  2.  If you choose to wash your hair, wash your hair first as usual with your normal shampoo.  3.  After you shampoo, rinse your hair and body thoroughly to remove the shampoo.  4.  Use CHG as you would any other liquid soap.  You can apply CHG directly to the skin and wash gently with scrungie or a clean washcloth.  5.  Apply the CHG Soap to your body ONLY FROM THE NECK DOWN.  Do not use on open wounds or open sores.  Avoid contact with your eyes, ears, mouth and genitals (private parts).  Wash genitals (private parts) with your normal soap.  6.  Wash thoroughly, paying special attention to the area where your surgery will be performed.  7.  Thoroughly rinse your body with warm water from the neck down.  8.  DO NOT shower/wash with your normal soap after using and rinsing off the CHG Soap.  9.  Pat yourself dry with a clean towel.            10.  Wear clean pajamas.            11.  Place clean sheets on your bed the night of your first shower and do not sleep with pets.  Day  of Surgery  Do not apply any lotions the morning of surgery.  Please wear clean clothes to the hospital/surgery center.     Please read over the following fact sheets that you were given: Pain Booklet, Coughing and Deep Breathing and Surgical Site Infection Prevention

## 2014-12-27 ENCOUNTER — Encounter (HOSPITAL_COMMUNITY): Payer: Self-pay

## 2014-12-27 ENCOUNTER — Encounter (HOSPITAL_COMMUNITY)
Admission: RE | Admit: 2014-12-27 | Discharge: 2014-12-27 | Disposition: A | Discharge status: Home / Self Care | Payer: Medicare Other | Source: Ambulatory Visit | Attending: Neurological Surgery | Admitting: Neurological Surgery

## 2014-12-27 DIAGNOSIS — Z01818 Encounter for other preprocedural examination: Secondary | ICD-10-CM | POA: Insufficient documentation

## 2014-12-27 LAB — SURGICAL PCR SCREEN
MRSA, PCR: NEGATIVE
STAPHYLOCOCCUS AUREUS: NEGATIVE

## 2014-12-27 LAB — CBC
HEMATOCRIT: 41.8 % (ref 39.0–52.0)
HEMOGLOBIN: 13.8 g/dL (ref 13.0–17.0)
MCH: 28.6 pg (ref 26.0–34.0)
MCHC: 33 g/dL (ref 30.0–36.0)
MCV: 86.7 fL (ref 78.0–100.0)
Platelets: 225 10*3/uL (ref 150–400)
RBC: 4.82 MIL/uL (ref 4.22–5.81)
RDW: 13.8 % (ref 11.5–15.5)
WBC: 8.7 10*3/uL (ref 4.0–10.5)

## 2014-12-27 LAB — BASIC METABOLIC PANEL
Anion gap: 9 (ref 5–15)
BUN: 11 mg/dL (ref 6–23)
CO2: 25 mmol/L (ref 19–32)
CREATININE: 1.03 mg/dL (ref 0.50–1.35)
Calcium: 9.2 mg/dL (ref 8.4–10.5)
Chloride: 105 mmol/L (ref 96–112)
GFR calc Af Amer: 83 mL/min — ABNORMAL LOW (ref >90)
GFR, EST NON AFRICAN AMERICAN: 72 mL/min — AB (ref >90)
Glucose, Bld: 100 mg/dL — ABNORMAL HIGH (ref 70–99)
Potassium: 4.3 mmol/L (ref 3.5–5.1)
Sodium: 139 mmol/L (ref 135–145)

## 2014-12-27 NOTE — Progress Notes (Signed)
Jessica from Dr. Clarice Pole office will send cardiac clearance.   Cardiologist: Dr. Carlyle Dolly [facility])  PCP:Dr. Matthias Hughs  [facility] [phone])

## 2014-12-30 NOTE — Progress Notes (Signed)
Anesthesia Chart Review:  Patient is a 69 year old male scheduled for L3-4, L4-5 laminectomy on 01/03/15 by Dr. Ellene Route.  History includes former smoker, HTN, HLD, GERD, STEMI/CAD s/p LAD and LCX stents '04, asthma, vertigo, colitis, anemia, COPD, back surgery, right TKA '15. PCP is listed as Dr. Matthias Hughs. Cardiologist is Dr. Harl Bowie who cleared him for this procedure.   10/05/14 EKG: SR with PACs.  10/06/13 Nuclear stress test: Low risk Lexiscan Cardiolite. No diagnostic ST segment changes were noted. Rare PACs observed. Perfusion imaging is consistent with diaphragmatic attenuation affecting the inferior wall, no clear evidence of scar or ischemia. LV volumes are normal and LVEF is 62% without wall motion abnormality.  08/29/11 Echo: Left ventricle: The cavity size was normal. Wall thickness was normal. Systolic function was normal. The estimated ejection fraction was in the range of 50% to 55%. Doppler parameters are consistent with abnormal left ventricular relaxation (grade 1 diastolic dysfunction). Impressions: Extremely limited due to poor sound wave transmission; LV function ? low normal; cannot R/O wall motion abnormality; suggest MUGA or cardiac MRI if clinically indicated.   08/27/11 Cardiac cath: Final Conclusions:  1. Continued patency of LAD and CFX stents. 2. 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 3. Diffuse plaque proximal LAD and CFX after stent site. 4. Preserved LV function.  Preoperative labs noted.  If no acute changes then I would anticipate that he could proceed as planned.  George Hugh [facility] Short Stay Center/Anesthesiology Phone [phone] 12/30/2014 12:27 PM

## 2015-01-02 MED ORDER — CEFAZOLIN SODIUM-DEXTROSE 2-3 GM-% IV SOLR
2.0000 g | INTRAVENOUS | Status: AC
  Administered 2015-01-03: 2 g via INTRAVENOUS
  Filled 2015-01-02: qty 50

## 2015-01-03 ENCOUNTER — Inpatient Hospital Stay (HOSPITAL_COMMUNITY)
Admission: RE | Admit: 2015-01-03 | Discharge: 2015-01-04 | DRG: 518 | Disposition: A | Discharge status: Home / Self Care | Payer: Medicare Other | Source: Ambulatory Visit | Attending: Neurological Surgery | Admitting: Neurological Surgery

## 2015-01-03 ENCOUNTER — Encounter (HOSPITAL_COMMUNITY): Payer: Self-pay | Admitting: *Deleted

## 2015-01-03 ENCOUNTER — Encounter (HOSPITAL_COMMUNITY)
Admission: RE | Disposition: A | Discharge status: Home / Self Care | Payer: Self-pay | Source: Ambulatory Visit | Attending: Neurological Surgery

## 2015-01-03 ENCOUNTER — Inpatient Hospital Stay (HOSPITAL_COMMUNITY): Payer: Medicare Other

## 2015-01-03 ENCOUNTER — Inpatient Hospital Stay (HOSPITAL_COMMUNITY): Payer: Medicare Other | Admitting: Anesthesiology

## 2015-01-03 ENCOUNTER — Inpatient Hospital Stay (HOSPITAL_COMMUNITY): Payer: Medicare Other | Admitting: Vascular Surgery

## 2015-01-03 DIAGNOSIS — M4726 Other spondylosis with radiculopathy, lumbar region: Secondary | ICD-10-CM | POA: Diagnosis present

## 2015-01-03 DIAGNOSIS — Z419 Encounter for procedure for purposes other than remedying health state, unspecified: Secondary | ICD-10-CM

## 2015-01-03 DIAGNOSIS — J449 Chronic obstructive pulmonary disease, unspecified: Secondary | ICD-10-CM | POA: Diagnosis present

## 2015-01-03 DIAGNOSIS — I10 Essential (primary) hypertension: Secondary | ICD-10-CM | POA: Diagnosis present

## 2015-01-03 DIAGNOSIS — Z87891 Personal history of nicotine dependence: Secondary | ICD-10-CM

## 2015-01-03 DIAGNOSIS — M4806 Spinal stenosis, lumbar region: Principal | ICD-10-CM | POA: Diagnosis present

## 2015-01-03 DIAGNOSIS — Z96652 Presence of left artificial knee joint: Secondary | ICD-10-CM | POA: Diagnosis present

## 2015-01-03 DIAGNOSIS — I252 Old myocardial infarction: Secondary | ICD-10-CM | POA: Diagnosis not present

## 2015-01-03 DIAGNOSIS — M48061 Spinal stenosis, lumbar region without neurogenic claudication: Secondary | ICD-10-CM | POA: Diagnosis present

## 2015-01-03 DIAGNOSIS — M2570 Osteophyte, unspecified joint: Secondary | ICD-10-CM | POA: Diagnosis present

## 2015-01-03 DIAGNOSIS — M79606 Pain in leg, unspecified: Secondary | ICD-10-CM | POA: Diagnosis present

## 2015-01-03 HISTORY — PX: LUMBAR LAMINECTOMY WITH COFLEX 2 LEVEL: SHX6515

## 2015-01-03 SURGERY — LUMBAR LAMINECTOMY WITH COFLEX 2 LEVEL
Anesthesia: General | Site: Spine Lumbar

## 2015-01-03 MED ORDER — FENTANYL CITRATE (PF) 100 MCG/2ML IJ SOLN
INTRAMUSCULAR | Status: DC | PRN
  Administered 2015-01-03: 25 ug via INTRAVENOUS
  Administered 2015-01-03 (×2): 50 ug via INTRAVENOUS
  Administered 2015-01-03: 25 ug via INTRAVENOUS
  Administered 2015-01-03: 50 ug via INTRAVENOUS

## 2015-01-03 MED ORDER — KETOROLAC TROMETHAMINE 15 MG/ML IJ SOLN
15.0000 mg | Freq: Four times a day (QID) | INTRAMUSCULAR | Status: DC
  Administered 2015-01-03 – 2015-01-04 (×4): 15 mg via INTRAVENOUS
  Filled 2015-01-03 (×5): qty 1

## 2015-01-03 MED ORDER — ONDANSETRON HCL 4 MG/2ML IJ SOLN
INTRAMUSCULAR | Status: DC | PRN
  Administered 2015-01-03: 4 mg via INTRAVENOUS

## 2015-01-03 MED ORDER — PHENOL 1.4 % MT LIQD: 1.0000 | OROMUCOSAL | Status: DC | PRN

## 2015-01-03 MED ORDER — PHENYLEPHRINE HCL 10 MG/ML IJ SOLN
INTRAMUSCULAR | Status: DC | PRN
  Administered 2015-01-03: 40 ug via INTRAVENOUS
  Administered 2015-01-03: 80 ug via INTRAVENOUS
  Administered 2015-01-03 (×7): 40 ug via INTRAVENOUS

## 2015-01-03 MED ORDER — KETOTIFEN FUMARATE 0.025 % OP SOLN
1.0000 [drp] | Freq: Two times a day (BID) | OPHTHALMIC | Status: DC
  Administered 2015-01-03: 1 [drp] via OPHTHALMIC
  Filled 2015-01-03: qty 5

## 2015-01-03 MED ORDER — HEMOSTATIC AGENTS (NO CHARGE) OPTIME
TOPICAL | Status: DC | PRN
  Administered 2015-01-03: 1 via TOPICAL

## 2015-01-03 MED ORDER — GLYCOPYRROLATE 0.2 MG/ML IJ SOLN
INTRAMUSCULAR | Status: DC | PRN
  Administered 2015-01-03: 0.2 mg via INTRAVENOUS
  Administered 2015-01-03: 0.4 mg via INTRAVENOUS

## 2015-01-03 MED ORDER — DEXAMETHASONE SODIUM PHOSPHATE 10 MG/ML IJ SOLN
INTRAMUSCULAR | Status: AC
  Filled 2015-01-03: qty 1

## 2015-01-03 MED ORDER — LOSARTAN POTASSIUM 50 MG PO TABS
50.0000 mg | ORAL_TABLET | Freq: Every day | ORAL | Status: DC
  Administered 2015-01-03 – 2015-01-04 (×2): 50 mg via ORAL
  Filled 2015-01-03 (×2): qty 1

## 2015-01-03 MED ORDER — MIDAZOLAM HCL 2 MG/2ML IJ SOLN
INTRAMUSCULAR | Status: AC
  Filled 2015-01-03: qty 2

## 2015-01-03 MED ORDER — LIDOCAINE HCL (CARDIAC) 20 MG/ML IV SOLN
INTRAVENOUS | Status: DC | PRN
  Administered 2015-01-03: 80 mg via INTRAVENOUS
  Administered 2015-01-03: 50 mg via INTRATRACHEAL

## 2015-01-03 MED ORDER — OXYCODONE HCL 5 MG/5ML PO SOLN: 5.0000 mg | Freq: Once | ORAL | Status: DC | PRN

## 2015-01-03 MED ORDER — BUPIVACAINE HCL (PF) 0.5 % IJ SOLN
INTRAMUSCULAR | Status: DC | PRN
  Administered 2015-01-03: 25 mL
  Administered 2015-01-03: 5 mL

## 2015-01-03 MED ORDER — PHENYLEPHRINE HCL 10 MG/ML IJ SOLN
10.0000 mg | INTRAVENOUS | Status: DC | PRN
  Administered 2015-01-03: 20 ug/min via INTRAVENOUS

## 2015-01-03 MED ORDER — SODIUM CHLORIDE 0.9 % IR SOLN
Status: DC | PRN
  Administered 2015-01-03: 500 mL

## 2015-01-03 MED ORDER — NEOSTIGMINE METHYLSULFATE 10 MG/10ML IV SOLN
INTRAVENOUS | Status: DC | PRN
  Administered 2015-01-03: 3 mg via INTRAVENOUS

## 2015-01-03 MED ORDER — POLYETHYLENE GLYCOL 3350 17 G PO PACK
17.0000 g | PACK | Freq: Every day | ORAL | Status: DC | PRN
  Filled 2015-01-03: qty 1

## 2015-01-03 MED ORDER — NEOSTIGMINE METHYLSULFATE 10 MG/10ML IV SOLN
INTRAVENOUS | Status: AC
  Filled 2015-01-03: qty 1

## 2015-01-03 MED ORDER — PANTOPRAZOLE SODIUM 40 MG PO TBEC
40.0000 mg | DELAYED_RELEASE_TABLET | Freq: Every day | ORAL | Status: DC
  Administered 2015-01-04: 40 mg via ORAL
  Filled 2015-01-03: qty 1

## 2015-01-03 MED ORDER — ACETAMINOPHEN 650 MG RE SUPP: 650.0000 mg | RECTAL | Status: DC | PRN

## 2015-01-03 MED ORDER — ACETAMINOPHEN 325 MG PO TABS: 650.0000 mg | ORAL_TABLET | ORAL | Status: DC | PRN

## 2015-01-03 MED ORDER — SODIUM CHLORIDE 0.9 % IV SOLN: 250.0000 mL | INTRAVENOUS | Status: DC

## 2015-01-03 MED ORDER — OXYCODONE HCL 5 MG PO TABS: 5.0000 mg | ORAL_TABLET | Freq: Once | ORAL | Status: DC | PRN

## 2015-01-03 MED ORDER — ROCURONIUM BROMIDE 50 MG/5ML IV SOLN
INTRAVENOUS | Status: AC
  Filled 2015-01-03: qty 1

## 2015-01-03 MED ORDER — METHOCARBAMOL 500 MG PO TABS: 500.0000 mg | ORAL_TABLET | Freq: Four times a day (QID) | ORAL | Status: DC | PRN

## 2015-01-03 MED ORDER — ONDANSETRON HCL 4 MG/2ML IJ SOLN
INTRAMUSCULAR | Status: AC
  Filled 2015-01-03: qty 2

## 2015-01-03 MED ORDER — SODIUM CHLORIDE 0.9 % IJ SOLN
3.0000 mL | Freq: Two times a day (BID) | INTRAMUSCULAR | Status: DC
  Administered 2015-01-03: 3 mL via INTRAVENOUS

## 2015-01-03 MED ORDER — LIDOCAINE-EPINEPHRINE 1 %-1:100000 IJ SOLN
INTRAMUSCULAR | Status: DC | PRN
  Administered 2015-01-03: 5 mL

## 2015-01-03 MED ORDER — ONDANSETRON HCL 4 MG/2ML IJ SOLN
4.0000 mg | INTRAMUSCULAR | Status: DC | PRN
  Administered 2015-01-03: 4 mg via INTRAVENOUS
  Filled 2015-01-03: qty 2

## 2015-01-03 MED ORDER — BISACODYL 10 MG RE SUPP: 10.0000 mg | Freq: Every day | RECTAL | Status: DC | PRN

## 2015-01-03 MED ORDER — ROCURONIUM BROMIDE 100 MG/10ML IV SOLN
INTRAVENOUS | Status: DC | PRN
  Administered 2015-01-03: 50 mg via INTRAVENOUS

## 2015-01-03 MED ORDER — METOPROLOL TARTRATE 12.5 MG HALF TABLET
12.5000 mg | ORAL_TABLET | Freq: Two times a day (BID) | ORAL | Status: DC
  Administered 2015-01-03 – 2015-01-04 (×2): 12.5 mg via ORAL
  Filled 2015-01-03 (×3): qty 1

## 2015-01-03 MED ORDER — FENTANYL CITRATE (PF) 250 MCG/5ML IJ SOLN
INTRAMUSCULAR | Status: AC
  Filled 2015-01-03: qty 5

## 2015-01-03 MED ORDER — LIDOCAINE HCL (CARDIAC) 20 MG/ML IV SOLN
INTRAVENOUS | Status: AC
  Filled 2015-01-03: qty 5

## 2015-01-03 MED ORDER — METHOCARBAMOL 1000 MG/10ML IJ SOLN: 500.0000 mg | Freq: Four times a day (QID) | INTRAVENOUS | Status: DC | PRN

## 2015-01-03 MED ORDER — DOCUSATE SODIUM 100 MG PO CAPS
100.0000 mg | ORAL_CAPSULE | Freq: Two times a day (BID) | ORAL | Status: DC
  Administered 2015-01-03: 100 mg via ORAL
  Filled 2015-01-03: qty 1

## 2015-01-03 MED ORDER — 0.9 % SODIUM CHLORIDE (POUR BTL) OPTIME
TOPICAL | Status: DC | PRN
  Administered 2015-01-03: 1000 mL

## 2015-01-03 MED ORDER — PROPOFOL 10 MG/ML IV BOLUS
INTRAVENOUS | Status: DC | PRN
  Administered 2015-01-03: 160 mg via INTRAVENOUS

## 2015-01-03 MED ORDER — LACTATED RINGERS IV SOLN
INTRAVENOUS | Status: DC
  Administered 2015-01-03: 50 mL/h via INTRAVENOUS
  Administered 2015-01-03: 11:00:00 via INTRAVENOUS

## 2015-01-03 MED ORDER — ARTIFICIAL TEARS OP OINT
TOPICAL_OINTMENT | OPHTHALMIC | Status: DC | PRN
  Administered 2015-01-03: 1 via OPHTHALMIC

## 2015-01-03 MED ORDER — HYDROMORPHONE HCL 1 MG/ML IJ SOLN: 0.2500 mg | INTRAMUSCULAR | Status: DC | PRN

## 2015-01-03 MED ORDER — NITROGLYCERIN 0.4 MG SL SUBL: 0.4000 mg | SUBLINGUAL_TABLET | SUBLINGUAL | Status: DC | PRN

## 2015-01-03 MED ORDER — SENNA 8.6 MG PO TABS
1.0000 | ORAL_TABLET | Freq: Two times a day (BID) | ORAL | Status: DC
  Administered 2015-01-03: 8.6 mg via ORAL
  Filled 2015-01-03 (×3): qty 1

## 2015-01-03 MED ORDER — GLYCOPYRROLATE 0.2 MG/ML IJ SOLN
INTRAMUSCULAR | Status: AC
  Filled 2015-01-03: qty 3

## 2015-01-03 MED ORDER — SODIUM CHLORIDE 0.9 % IJ SOLN: 3.0000 mL | INTRAMUSCULAR | Status: DC | PRN

## 2015-01-03 MED ORDER — THROMBIN 5000 UNITS EX SOLR
OROMUCOSAL | Status: DC | PRN
  Administered 2015-01-03: 5 mL via TOPICAL

## 2015-01-03 MED ORDER — PROMETHAZINE HCL 25 MG/ML IJ SOLN: 6.2500 mg | INTRAMUSCULAR | Status: DC | PRN

## 2015-01-03 MED ORDER — DEXAMETHASONE SODIUM PHOSPHATE 10 MG/ML IJ SOLN
INTRAMUSCULAR | Status: DC | PRN
  Administered 2015-01-03: 10 mg via INTRAVENOUS

## 2015-01-03 MED ORDER — HYDROMORPHONE HCL 1 MG/ML IJ SOLN
0.5000 mg | INTRAMUSCULAR | Status: DC | PRN
  Administered 2015-01-03: 1 mg via INTRAVENOUS
  Filled 2015-01-03: qty 1

## 2015-01-03 MED ORDER — MENTHOL 3 MG MT LOZG: 1.0000 | LOZENGE | OROMUCOSAL | Status: DC | PRN

## 2015-01-03 MED ORDER — OXYCODONE-ACETAMINOPHEN 5-325 MG PO TABS
1.0000 | ORAL_TABLET | ORAL | Status: DC | PRN
Start: 2015-01-03 — End: 2015-01-04
  Administered 2015-01-04 (×2): 1 via ORAL
  Filled 2015-01-03: qty 2
  Filled 2015-01-03: qty 1

## 2015-01-03 MED ORDER — FLEET ENEMA 7-19 GM/118ML RE ENEM
1.0000 | ENEMA | Freq: Once | RECTAL | Status: AC | PRN
  Filled 2015-01-03: qty 1

## 2015-01-03 MED ORDER — ARTIFICIAL TEARS OP OINT
TOPICAL_OINTMENT | OPHTHALMIC | Status: AC
  Filled 2015-01-03: qty 3.5

## 2015-01-03 MED ORDER — THROMBIN 5000 UNITS EX SOLR
CUTANEOUS | Status: DC | PRN
  Administered 2015-01-03 (×2): 5000 [IU] via TOPICAL

## 2015-01-03 MED ORDER — PROMETHAZINE HCL 25 MG/ML IJ SOLN
12.5000 mg | Freq: Four times a day (QID) | INTRAMUSCULAR | Status: DC | PRN
  Administered 2015-01-03: 12.5 mg via INTRAVENOUS
  Filled 2015-01-03: qty 1

## 2015-01-03 MED ORDER — PROPOFOL 10 MG/ML IV BOLUS
INTRAVENOUS | Status: AC
  Filled 2015-01-03: qty 20

## 2015-01-03 MED ORDER — CEFAZOLIN SODIUM 1-5 GM-% IV SOLN
1.0000 g | Freq: Three times a day (TID) | INTRAVENOUS | Status: AC
  Administered 2015-01-03 – 2015-01-04 (×2): 1 g via INTRAVENOUS
  Filled 2015-01-03 (×2): qty 50

## 2015-01-03 SURGICAL SUPPLY — 62 items
ADH SKN CLS LQ APL DERMABOND (GAUZE/BANDAGES/DRESSINGS) ×1
BAG DECANTER FOR FLEXI CONT (MISCELLANEOUS) ×3 IMPLANT
BLADE CLIPPER SURG (BLADE) IMPLANT
BUR ACORN 6.0 (BURR) IMPLANT
BUR ACORN 6.0MM (BURR)
BUR MATCHSTICK NEURO 3.0 LAGG (BURR) ×3 IMPLANT
CANISTER SUCT 3000ML PPV (MISCELLANEOUS) ×3 IMPLANT
CONT SPEC 4OZ CLIKSEAL STRL BL (MISCELLANEOUS) ×3 IMPLANT
DECANTER SPIKE VIAL GLASS SM (MISCELLANEOUS) ×3 IMPLANT
DERMABOND ADHESIVE PROPEN (GAUZE/BANDAGES/DRESSINGS) ×2
DERMABOND ADVANCED .7 DNX6 (GAUZE/BANDAGES/DRESSINGS) IMPLANT
DEVICE COFLEX STABLIZATION 8MM (Neuro Prosthesis/Implant) ×4 IMPLANT
DRAPE C-ARM 42X72 X-RAY (DRAPES) ×6 IMPLANT
DRAPE C-ARMOR (DRAPES) ×1 IMPLANT
DRAPE LAPAROTOMY T 102X78X121 (DRAPES) ×3 IMPLANT
DRAPE MICROSCOPE LEICA (MISCELLANEOUS) IMPLANT
DRAPE POUCH INSTRU U-SHP 10X18 (DRAPES) ×3 IMPLANT
DRAPE PROXIMA HALF (DRAPES) IMPLANT
DRSG OPSITE POSTOP 4X8 (GAUZE/BANDAGES/DRESSINGS) ×2 IMPLANT
DURAPREP 26ML APPLICATOR (WOUND CARE) ×3 IMPLANT
ELECT REM PT RETURN 9FT ADLT (ELECTROSURGICAL) ×3
ELECTRODE REM PT RTRN 9FT ADLT (ELECTROSURGICAL) ×1 IMPLANT
GAUZE SPONGE 4X4 12PLY STRL (GAUZE/BANDAGES/DRESSINGS) ×1 IMPLANT
GAUZE SPONGE 4X4 16PLY XRAY LF (GAUZE/BANDAGES/DRESSINGS) IMPLANT
GLOVE BIO SURGEON STRL SZ8 (GLOVE) ×2 IMPLANT
GLOVE BIO SURGEON STRL SZ8.5 (GLOVE) ×4 IMPLANT
GLOVE BIOGEL PI IND STRL 7.5 (GLOVE) IMPLANT
GLOVE BIOGEL PI IND STRL 8.5 (GLOVE) ×1 IMPLANT
GLOVE BIOGEL PI INDICATOR 7.5 (GLOVE) ×4
GLOVE BIOGEL PI INDICATOR 8.5 (GLOVE) ×2
GLOVE ECLIPSE 7.5 STRL STRAW (GLOVE) ×2 IMPLANT
GLOVE ECLIPSE 8.5 STRL (GLOVE) ×3 IMPLANT
GLOVE EXAM NITRILE LRG STRL (GLOVE) IMPLANT
GLOVE EXAM NITRILE MD LF STRL (GLOVE) IMPLANT
GLOVE EXAM NITRILE XL STR (GLOVE) IMPLANT
GLOVE EXAM NITRILE XS STR PU (GLOVE) IMPLANT
GLOVE SURG SS PI 7.0 STRL IVOR (GLOVE) ×4 IMPLANT
GOWN STRL REUS W/ TWL LRG LVL3 (GOWN DISPOSABLE) IMPLANT
GOWN STRL REUS W/ TWL XL LVL3 (GOWN DISPOSABLE) IMPLANT
GOWN STRL REUS W/TWL 2XL LVL3 (GOWN DISPOSABLE) ×3 IMPLANT
GOWN STRL REUS W/TWL LRG LVL3 (GOWN DISPOSABLE)
GOWN STRL REUS W/TWL XL LVL3 (GOWN DISPOSABLE) ×6
KIT BASIN OR (CUSTOM PROCEDURE TRAY) ×3 IMPLANT
KIT ROOM TURNOVER OR (KITS) ×3 IMPLANT
LIQUID BAND (GAUZE/BANDAGES/DRESSINGS) ×1 IMPLANT
NDL SPNL 20GX3.5 QUINCKE YW (NEEDLE) IMPLANT
NEEDLE HYPO 22GX1.5 SAFETY (NEEDLE) ×3 IMPLANT
NEEDLE SPNL 20GX3.5 QUINCKE YW (NEEDLE) ×3 IMPLANT
NS IRRIG 1000ML POUR BTL (IV SOLUTION) ×3 IMPLANT
PACK LAMINECTOMY NEURO (CUSTOM PROCEDURE TRAY) ×3 IMPLANT
PAD ARMBOARD 7.5X6 YLW CONV (MISCELLANEOUS) ×13 IMPLANT
PATTIES SURGICAL .5 X1 (DISPOSABLE) ×1 IMPLANT
RUBBERBAND STERILE (MISCELLANEOUS) IMPLANT
SPONGE SURGIFOAM ABS GEL SZ50 (HEMOSTASIS) ×3 IMPLANT
SUT VIC AB 1 CT1 18XBRD ANBCTR (SUTURE) ×1 IMPLANT
SUT VIC AB 1 CT1 8-18 (SUTURE) ×3
SUT VIC AB 2-0 CP2 18 (SUTURE) ×3 IMPLANT
SUT VIC AB 3-0 SH 8-18 (SUTURE) ×9 IMPLANT
SYR 20ML ECCENTRIC (SYRINGE) ×3 IMPLANT
TOWEL OR 17X24 6PK STRL BLUE (TOWEL DISPOSABLE) ×3 IMPLANT
TOWEL OR 17X26 10 PK STRL BLUE (TOWEL DISPOSABLE) ×3 IMPLANT
WATER STERILE IRR 1000ML POUR (IV SOLUTION) ×3 IMPLANT

## 2015-01-03 NOTE — H&P (Addendum)
CHIEF COMPLAINT:                              Back and bilateral lower extremity pain and discomfort for at least a year's time.                                                      HOPI:                                                   [name] [name] is a 69 year old right-handed individual whom I have treated in the past, having done a lumbar laminectomy on him back in the early 90s.  He notes that he has been having progressively worsening pain in his back and his legs, such that he can only walk a very short distance.  He finds that he has to sit down and rest in order to get some relief, then he can carry on for another distance further.  The problem has been getting progressively worse and ultimately he underwent an MRI of the lumbar spine on 12/28.  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  Also, around that time it was noted that he has Paget's disease of bone, with involvement in the anterior superior iliac crest.  At L3-4 he has marked hypertrophy of the facet joints bilaterally, in addition to broad-based bulging of the disc.  At L4-5 he has a combination of disc degeneration, with posterior bony spurring.  The disc space is quite flattened.  The facets are markedly hypertrophied, causing substantial subarticular stenosis in the lateral recesses.  This involves both the exiting nerve roots and the central canal.             DATA:                                                  Today in the office, to further his workup, I obtained a lateral flexion extension film, in addition to a singular AP view.  The radiographs demonstrate that [name] [name] has advanced spondylytic degeneration of L4-L5 with collapse of the disc space.  He has a slight grade I anterolisthesis at L3-L4, with no significant abnormal motion between flexion and extension.  The alignment in the coronal plane on  his plane x-ray is quite normal.    PAST MEDICAL HISTORY:                    Past medical history reveals that [name] [name] has some hypertension.  He had a heart attack back in 2004.  He had a stenting procedure back then.  He has some COPD also.  He has had knee replacements in the past in 11/2013, carpal tunnel surgery, and a total knee replacement on the left side back in 2003.  INTERVAL PFSH:                                  Personal history reveals that codeine makes him sick on his stomach.  He tells me that he quit smoking at age 61 and he does not use any alcohol.               REVIEW OF SYSTEMS:                        Systems review is notable for some back pain, joint pain and swelling, arthritis, chronic cough, shortness of breath, and high cholesterol on a 14-point review sheet.         PHYSICAL EXAMINATION:                    His height and weight have been stable at 5 feet 10 and 210 pounds.  On physical examination, I note that he stands straight and erect.  He is able to flex fully, touching his fingertips to his toes, but he notes that there is some sharp pain in his back when he goes to straighten out and he tends to favor a slight forward stoop.  His motor function is good in the iliopsoas, the quads, the tibialis anterior and the gastroc to confrontation.  Albeit, his reflexes are absent in the distal lower extremities and straight leg raising is negative to 15 degrees.  Patrick's maneuver is intact.    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  I advised at this time that he should consider surgical decompression of both of these levels.  Given the fact that he has severe spondylytic stenosis, without much abnormal motion, I believe that a simple decompression would suffice, but I also believe that [name] [name] would benefit from placement of Coflex at each of these levels to maintain the integrity  of the decompression.  I noted that this device does not fuse the spine, but it simply allows it to maintain in a slightly flexed position to keep the spine open.  I would like to proceed with a surgery to decompress his spine and use a Coflex device at L3-4 and L4-5.  He is admitted for surgery today.

## 2015-01-03 NOTE — Anesthesia Procedure Notes (Signed)
Procedure Name: Intubation Date/Time: 01/03/2015 10:10 AM Performed by: Jenne Campus Pre-anesthesia Checklist: Patient identified, Emergency Drugs available, Suction available, Patient being monitored and Timeout performed Patient Re-evaluated:Patient Re-evaluated prior to inductionOxygen Delivery Method: Circle system utilized Preoxygenation: Pre-oxygenation with 100% oxygen Intubation Type: IV induction Ventilation: Mask ventilation without difficulty and Oral airway inserted - appropriate to patient size Laryngoscope Size: Miller and 2 Grade View: Grade I Tube type: Oral Tube size: 7.5 mm Number of attempts: 1 Airway Equipment and Method: Stylet Placement Confirmation: ETT inserted through vocal cords under direct vision,  positive ETCO2,  CO2 detector and breath sounds checked- equal and bilateral Secured at: 22 cm Tube secured with: Tape Dental Injury: Teeth and Oropharynx as per pre-operative assessment

## 2015-01-03 NOTE — Transfer of Care (Signed)
Immediate Anesthesia Transfer of Care Note  Patient: [name] [name]  Procedure(s) Performed: Procedure(s): Lumbar three-four,Lumbar four-five Laminectomy with Coflex (N/A)  Patient Location: PACU  Anesthesia Type:General  Level of Consciousness: awake, oriented and patient cooperative  Airway & Oxygen Therapy: Patient Spontanous Breathing and Patient connected to face mask oxygen  Post-op Assessment: Report given to RN and Post -op Vital signs reviewed and stable  Post vital signs: Reviewed  Last Vitals:  Filed Vitals:   01/03/15 0659  BP: 159/76  Pulse: 65  Temp: 36.6 C  Resp: 20    Complications: No apparent anesthesia complications

## 2015-01-03 NOTE — Anesthesia Preprocedure Evaluation (Addendum)
Anesthesia Evaluation  Patient identified by MRN, date of birth, ID band Patient awake    Reviewed: Allergy & Precautions, NPO status , Patient's Chart, lab work & pertinent test results  History of Anesthesia Complications Negative for: history of anesthetic complications  Airway Mallampati: II  TM Distance: >3 FB Neck ROM: Full    Dental  (+) Teeth Intact, Dental Advisory Given,    Pulmonary shortness of breath, asthma , COPDformer smoker,  breath sounds clear to auscultation        Cardiovascular hypertension, Pt. on medications + CAD and + Past MI Rhythm:Regular Rate:Normal  Results of recent scan noted   Neuro/Psych negative neurological ROS     GI/Hepatic Neg liver ROS, GERD-  Medicated and Controlled,  Endo/Other  negative endocrine ROS  Renal/GU negative Renal ROS     Musculoskeletal  (+) Arthritis -,   Abdominal   Peds  Hematology negative hematology ROS (+)   Anesthesia Other Findings C = caps/implants  Reproductive/Obstetrics negative OB ROS                            Anesthesia Physical Anesthesia Plan  ASA: III  Anesthesia Plan: General   Post-op Pain Management:    Induction: Intravenous  Airway Management Planned: Oral ETT  Additional Equipment:   Intra-op Plan:   Post-operative Plan: Extubation in OR  Informed Consent: I have reviewed the patients History and Physical, chart, labs and discussed the procedure including the risks, benefits and alternatives for the proposed anesthesia with the patient or authorized representative who has indicated his/her understanding and acceptance.   Dental advisory given  Plan Discussed with: CRNA and Surgeon  Anesthesia Plan Comments:         Anesthesia Quick Evaluation

## 2015-01-03 NOTE — Evaluation (Signed)
Physical Therapy Evaluation Patient Details Name: [name] [name] MRN: [id number] DOB: 1945-12-26 Today's Date: 01/03/2015   History of Present Illness  pt is a 69 y/o male admitted for elective lumbar surgery due to pain in LE's over last 6 months, s/p L34, L45 lami/foraminectomy and decompression of L3, L4, L5 nerve roots.  Clinical Impression  All education completed with pt and wife.  Pt is a supervision level and wife will be around to assist as needed.  No further PT needs.  Will sign off.    Follow Up Recommendations No PT follow up    Equipment Recommendations  None recommended by PT    Recommendations for Other Services       Precautions / Restrictions Precautions Precautions: Back Restrictions Weight Bearing Restrictions: No      Mobility  Bed Mobility Overal bed mobility: Needs Assistance Bed Mobility: Rolling;Sidelying to Sit;Sit to Sidelying Rolling: Supervision Sidelying to sit: Supervision     Sit to sidelying: Supervision General bed mobility comments: reinforced log roll and technique side to/from sit.  Transfers Overall transfer level: Needs assistance   Transfers: Sit to/from Stand Sit to Stand: Supervision            Ambulation/Gait Ambulation/Gait assistance: Supervision Ambulation Distance (Feet): 300 Feet Assistive device: None Gait Pattern/deviations: WFL(Within Functional Limits) Gait velocity: moderate Gait velocity interpretation: at or above normal speed for age/gender General Gait Details: stedy and safe  Stairs Stairs: Yes Stairs assistance: Modified independent (Device/Increase time) Stair Management: One rail Right;Alternating pattern;Forwards Number of Stairs: 5 General stair comments: safe with rail  Wheelchair Mobility    Modified Rankin (Stroke Patients Only)       Balance Overall balance assessment: No apparent balance deficits (not formally assessed)                                            Pertinent Vitals/Pain Pain Assessment: Faces Faces Pain Scale: Hurts a little bit Pain Location: back incision Pain Descriptors / Indicators: Grimacing Pain Intervention(s): Monitored during session;Repositioned    Home Living Family/patient expects to be discharged to:: Private residence   Available Help at Discharge: Family Type of Home: House       Home Layout: One level Home Equipment: Environmental consultant - 2 wheels;Shower seat;Bedside commode      Prior Function Level of Independence: Independent               Hand Dominance        Extremity/Trunk Assessment   Upper Extremity Assessment: Defer to OT evaluation           Lower Extremity Assessment: Overall WFL for tasks assessed      Cervical / Trunk Assessment: Normal  Communication   Communication: No difficulties  Cognition Arousal/Alertness: Awake/alert Behavior During Therapy: WFL for tasks assessed/performed Overall Cognitive Status: Within Functional Limits for tasks assessed                      General Comments General comments (skin integrity, edema, etc.): educated/reinforced pt and wife in back care/prec. log roll and transition sie tofrom sit, lifting precautions, progression of activity    Exercises        Assessment/Plan    PT Assessment Patent does not need any further PT services  PT Diagnosis     PT Problem List    PT Treatment Interventions  PT Goals (Current goals can be found in the Care Plan section) Acute Rehab PT Goals PT Goal Formulation: All assessment and education complete, DC therapy    Frequency     Barriers to discharge        Co-evaluation               End of Session   Activity Tolerance: Patient tolerated treatment well Patient left: in bed;with call bell/phone within reach;with family/visitor present           Time: 5462-7035 PT Time Calculation (min) (ACUTE ONLY): 23 min   Charges:   PT Evaluation $Initial PT Evaluation  Tier I: 1 Procedure PT Treatments $Gait Training: 8-22 mins   PT G Codes:        Danisha Brassfield, Tessie Fass 01/03/2015, 5:15 PM 01/03/2015  Donnella Sham, PT [phone]  (pager)

## 2015-01-03 NOTE — Op Note (Signed)
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 Surgeon: Kristeen Miss Asst.: Newman Pies M.D. Anesthesia: Gen. endotracheal Indications: [name] [name] is a 69 year old individual who years ago and had a laminectomy at L3-4 and L4-5 he has developed substantial spondylitic disease and has now severe central canal stenosis in addition to lateral recess stenosis involving superiorly the L3 nerve root L4 nerve root and the L5 nerve root is mostly coming from spondylitic changes at L3-4 L4-5. He's been advised regarding the need for surgical decompression and also advised regarding basement of Coflex device to maintain some stability.  Procedure: The patient was brought to the operating room supine on stretcher. After the smooth induction of general endotracheal anesthesia, he was turned prone in the back was prepped with alcohol and DuraPrep and draped in a sterile fashion. Midline incision was created through his previous scar and this was dissected down to the lumbodorsal fascia. Lumbodorsal fascia was opened on either side of the midline inferior spinous process was identified as that of L3. Then the interlaminar spaces at L3-4 and L4-5 were cleared in a subperiosteal fashion. There is noted to be a slight bit of scoliosis with the spine twisting to the patient's left laminotomies was then created at L3-4 bilaterally and there was noted be some adherent epidural fibrosis particularly on the left side once this was released thickened redundant yellow ligament was encountered causing significant central canal  compromise. This was decompressed. The path of the L3 nerve root superiorly was then decompressed using a 2 mm curved Kerrison punch in addition to a high-speed drill to undercut the second once the nerve roots were decompressed attention was turned L4 with nerve roots were similarly decompressed with a foraminotomy. Once L3-4 was decompressed attention was turned L4-5 were bilaterally laminotomies and foraminotomies were created for the L5 nerve root inferiorly and the path of the L4 nerve root superiorly was cleared. Here there was substantial thickened scar attached to the dura which made dissection go very slowly. Nonetheless this was able to be completed without any dural incursion. The spinous processes were then isolated by removing the interspinous ligament at L3-4 and L4-5. The path of the interspinous space was cleared and ultimately an 8 mm to flex trial spacer to be placed and the interest spinous space at L4-5. A Coflex device and the size was then placed in the standard position being tamped to within a millimeter of the dura. This was then fixed into position with closing of the anxiety onto the spinous process. At L3-4 also an 8 mm Coflex was used and this was fixed to the spinous process again within a few millimeters of the dural surface. Final radiographs were obtained with fluoroscopic guidance and checked the position of the Coflex devices. The wound was irrigated copiously with antibiotic irrigating solution, hemostasis was achieved with the use of some Surgifoam which was irrigated away, and then 25 mL of half percent Marcaine were injected into the paraspinous musculature and fascia. The lumbodorsal fascia was closed with #1 Vicryl in interrupted fashion, 2-0 Vicryl was used in subcutaneous tissues, 3-0 Vicryl subcuticularly. Blood loss is estimated at 100 mL. Patient was returned to the recovery room in stable condition.

## 2015-01-04 MED ORDER — OXYCODONE-ACETAMINOPHEN 5-325 MG PO TABS: 1.0000 | ORAL_TABLET | ORAL | Status: DC | PRN

## 2015-01-04 MED ORDER — DIAZEPAM 5 MG PO TABS: 5.0000 mg | ORAL_TABLET | Freq: Four times a day (QID) | ORAL | Status: DC | PRN

## 2015-01-04 NOTE — Discharge Summary (Signed)
Physician Discharge Summary  Patient ID: [name] [name] MRN: [id number] DOB/AGE: Jul 24, 1946 69 y.o.  Admit date: 01/03/2015 Discharge date: 01/04/2015  Admission Diagnoses: Spondylosis and stenosis L3-4 and L4-5 with radiculopathy and neurogenic claudication  Discharge Diagnoses: Spondylosis and stenosis L3-4 and L4-5 with radiculopathy and neurogenic claudication  Active Problems:   Lumbar spinal stenosis   Discharged Condition: good  Hospital Course: Patient was admitted to undergo surgical decompression and tolerated the surgery well. He is ambulatory. His incision is clean and dry.  Consults: None  Significant Diagnostic Studies: None  Treatments: surgery: Bilateral laminotomies and foraminotomies L3-4 and L4-5 placement of Coflex device L3-4 and L4-5  Discharge Exam: Blood pressure 120/61, pulse 66, temperature 98.3 F (36.8 C), temperature source Oral, resp. rate 16, height 5\' 10"  (1.778 m), weight 94.802 kg (209 lb), SpO2 98 %. Incision is clean dry motor function is intact in lower extremities station and gait are normal  Disposition: 06-Home-Health Care Svc  Discharge Instructions    Call MD for:  redness, tenderness, or signs of infection (pain, swelling, redness, odor or green/yellow discharge around incision site)    Complete by:  As directed      Call MD for:  severe uncontrolled pain    Complete by:  As directed      Call MD for:  temperature >100.4    Complete by:  As directed      Diet - low sodium heart healthy    Complete by:  As directed      Discharge instructions    Complete by:  As directed   Okay to shower. Do not apply salves or ointments to incision. No heavy lifting with the upper extremities greater than 15 pounds. May resume driving when not requiring pain medication and patient feels comfortable with doing so.     Increase activity slowly    Complete by:  As directed             Medication List    TAKE these medications        aspirin  81 MG tablet  Take 81 mg by mouth daily.     cholecalciferol 1000 UNITS tablet  Commonly known as:  VITAMIN D  Take 2,000 Units by mouth daily.     diazepam 5 MG tablet  Commonly known as:  VALIUM  Take 1 tablet (5 mg total) by mouth every 6 (six) hours as needed for muscle spasms.     losartan 50 MG tablet  Commonly known as:  COZAAR  Take 1 tablet (50 mg total) by mouth daily.     metoprolol tartrate 25 MG tablet  Commonly known as:  LOPRESSOR  Take 0.5 tablets (12.5 mg total) by mouth 2 (two) times daily.     multivitamin capsule  Take 1 capsule by mouth daily.     naproxen sodium 220 MG tablet  Commonly known as:  ANAPROX  Take 220 mg by mouth 2 (two) times daily as needed (for pain).     nitroGLYCERIN 0.4 MG SL tablet  Commonly known as:  NITROSTAT  Place 1 tablet (0.4 mg total) under the tongue every 5 (five) minutes as needed. Chest pain     omeprazole 40 MG capsule  Commonly known as:  PRILOSEC  Take 1 capsule (40 mg total) by mouth 2 (two) times daily.     oxyCODONE-acetaminophen 5-325 MG per tablet  Commonly known as:  ROXICET  Take 1 tablet by mouth every 4 (four) hours as needed.  oxyCODONE-acetaminophen 5-325 MG per tablet  Commonly known as:  ROXICET  Take 1-2 tablets by mouth every 4 (four) hours as needed for moderate pain or severe pain.     simvastatin 40 MG tablet  Commonly known as:  ZOCOR  Take 1 tablet (40 mg total) by mouth at bedtime.         SignedEarleen Newport 01/04/2015, 4:13 PM

## 2015-01-04 NOTE — Progress Notes (Signed)
Orthopedic Tech Progress Note Patient Details:  [name] [name] [birth year] [id number]  Patient ID: Lavell Islam, male   DOB: [birth year], 69 y.o.   MRN: [id number] Called in bio-tech brace order; spoke with Dolores Lory, Elinore Shults 01/04/2015, 9:20 AM

## 2015-01-04 NOTE — Evaluation (Signed)
Occupational Therapy Evaluation and Discharge Summary Patient Details Name: FARON TUDISCO MRN: [id number] DOB: 26-Jun-1946 Today's Date: 01/04/2015    History of Present Illness pt is a 69 y/o male admitted for elective lumbar surgery due to pain in LE's over last 6 months, s/p L34, L45 lami/foraminectomy and decompression of L3, L4, L5 nerve roots.   Clinical Impression   Pt admitted for above surgery and overall is doing very well. Pt remains independent with all adls with occasional cues for back precautions.  Brace was not available at time of OT evaluation so family will need to be educated on how to donn brace.      Follow Up Recommendations  No OT follow up;Supervision - Intermittent    Equipment Recommendations  None recommended by OT    Recommendations for Other Services       Precautions / Restrictions Precautions Precautions: Back Required Braces or Orthoses: Spinal Brace Spinal Brace: Applied in sitting position Restrictions Weight Bearing Restrictions: No      Mobility Bed Mobility Overal bed mobility: Modified Independent Bed Mobility: Rolling;Sidelying to Sit Rolling: Modified independent (Device/Increase time) Sidelying to sit: Modified independent (Device/Increase time)       General bed mobility comments: Reinforced log rolling and went over all back precautions in relation to bed mobility.  Transfers Overall transfer level: Modified independent Equipment used: None Transfers: Sit to/from Stand Sit to Stand: Modified independent (Device/Increase time)         General transfer comment: Pt does not need assist with transfers; just reviewed all precautions.    Balance Overall balance assessment: No apparent balance deficits (not formally assessed)                                          ADL Overall ADL's : Modified independent                                       General ADL Comments: Pt did well with  all adls. Pt moves slowly and deliberatly.  Reviewed all back precautions and applied them to all adls.  Pt understands.  Brace not available at time of evaluation.  Pt will need instruction on how to donn.      Vision     Perception     Praxis      Pertinent Vitals/Pain Pain Assessment: 0-10 Pain Score: 2  Faces Pain Scale: Hurts a little bit Pain Location: back Pain Descriptors / Indicators: Sore Pain Intervention(s): Monitored during session;Repositioned     Hand Dominance Right   Extremity/Trunk Assessment Upper Extremity Assessment Upper Extremity Assessment: Overall WFL for tasks assessed   Lower Extremity Assessment Lower Extremity Assessment: Defer to PT evaluation   Cervical / Trunk Assessment Cervical / Trunk Assessment: Normal   Communication Communication Communication: No difficulties   Cognition Arousal/Alertness: Awake/alert Behavior During Therapy: WFL for tasks assessed/performed Overall Cognitive Status: Within Functional Limits for tasks assessed                     General Comments       Exercises       Shoulder Instructions      Home Living Family/patient expects to be discharged to:: Private residence Living Arrangements: Spouse/significant other Available Help at Discharge: Family Type of Home: House  Home Layout: One level     Bathroom Shower/Tub: Walk-in shower;Door   [employer] Toilet: Standard     Home Equipment: Environmental consultant - 2 wheels;Shower seat;Bedside commode          Prior Functioning/Environment Level of Independence: Independent             OT Diagnosis:     OT Problem List:     OT Treatment/Interventions:      OT Goals(Current goals can be found in the care plan section) Acute Rehab OT Goals Patient Stated Goal: to go home. OT Goal Formulation: All assessment and education complete, DC therapy  OT Frequency:     Barriers to D/C:            Co-evaluation              End of  Session Nurse Communication: Mobility status  Activity Tolerance: Patient tolerated treatment well Patient left: in chair;with call bell/phone within reach;with family/visitor present   Time: 0840-0900 OT Time Calculation (min): 20 min Charges:  OT General Charges $OT Visit: 1 Procedure OT Evaluation $Initial OT Evaluation Tier I: 1 Procedure G-Codes:    Glenford Peers 01-24-2015, 9:08 AM  [phone]

## 2015-01-04 NOTE — Anesthesia Postprocedure Evaluation (Signed)
  Anesthesia Post-op Note  Patient: [name] [name]  Procedure(s) Performed: Procedure(s): Lumbar three-four,Lumbar four-five Laminectomy with Coflex (N/A)  Patient Location: PACU  Anesthesia Type:General  Level of Consciousness: awake and alert   Airway and Oxygen Therapy: Patient Spontanous Breathing  Post-op Pain: mild  Post-op Assessment: Post-op Vital signs reviewed  Post-op Vital Signs: stable  Last Vitals:  Filed Vitals:   01/04/15 0749  BP: 108/62  Pulse: 64  Temp: 36.8 C  Resp: 16    Complications: No apparent anesthesia complications

## 2015-01-04 NOTE — Progress Notes (Signed)
Pt doing well. Pt and wife given D/C instructions with Rx's, verbal understanding was provided. Pt's incision is covered with Honeycomb dressing and has no sign of infection. Pt's IV was removed prior to D/C. Pt D/C'd home via walking @ 1640 per MD order. Pt has no other needs at this time. Holli Humbles, RN

## 2015-01-04 NOTE — Progress Notes (Signed)
Patient ID: [name] [name], male   DOB: [birth year], 69 y.o.   MRN: [id number] Motor function ok. Some back soreness.   Doing well.

## 2015-01-05 ENCOUNTER — Encounter (HOSPITAL_COMMUNITY): Payer: Self-pay | Admitting: Neurological Surgery

## 2015-01-05 NOTE — Progress Notes (Signed)
Utilization review completed.  

## 2015-03-14 ENCOUNTER — Other Ambulatory Visit: Payer: Self-pay

## 2015-04-25 ENCOUNTER — Other Ambulatory Visit: Payer: Self-pay | Admitting: *Deleted

## 2015-04-25 MED ORDER — METOPROLOL TARTRATE 25 MG PO TABS: 12.5000 mg | ORAL_TABLET | Freq: Two times a day (BID) | ORAL | Status: DC

## 2015-08-22 ENCOUNTER — Other Ambulatory Visit: Payer: Self-pay | Admitting: *Deleted

## 2015-08-22 MED ORDER — LOSARTAN POTASSIUM 50 MG PO TABS: 50.0000 mg | ORAL_TABLET | Freq: Every day | ORAL | Status: DC

## 2015-11-16 ENCOUNTER — Ambulatory Visit (INDEPENDENT_AMBULATORY_CARE_PROVIDER_SITE_OTHER): Payer: Medicare Other | Admitting: Cardiology

## 2015-11-16 ENCOUNTER — Encounter: Payer: Self-pay | Admitting: Cardiology

## 2015-11-16 VITALS — BP 120/72 | HR 100 | Ht 71.0 in | Wt 209.0 lb

## 2015-11-16 DIAGNOSIS — I10 Essential (primary) hypertension: Secondary | ICD-10-CM

## 2015-11-16 DIAGNOSIS — I2510 Atherosclerotic heart disease of native coronary artery without angina pectoris: Secondary | ICD-10-CM | POA: Diagnosis not present

## 2015-11-16 DIAGNOSIS — E785 Hyperlipidemia, unspecified: Secondary | ICD-10-CM | POA: Diagnosis not present

## 2015-11-16 MED ORDER — LOSARTAN POTASSIUM 50 MG PO TABS: 50.0000 mg | ORAL_TABLET | Freq: Every day | ORAL | Status: DC

## 2015-11-16 NOTE — Patient Instructions (Signed)
Medication Instructions:  Your physician recommends that you continue on your current medications as directed. Please refer to the Current Medication list given to you today.   Labwork: I WILL REQUEST LABS FROM YOUR PCP  Testing/Procedures: NONE  Follow-Up: Your physician wants you to follow-up in: 1 YEAR .  You will receive a reminder letter in the mail two months in advance. If you don't receive a letter, please call our office to schedule the follow-up appointment.   Any Other Special Instructions Will Be Listed Below (If Applicable).  I SENT IN REFILLS FOR LOSARTAN     If you need a refill on your cardiac medications before your next appointment, please call your pharmacy.

## 2015-11-16 NOTE — Progress Notes (Addendum)
Patient ID: [name] [name] [name], male   DOB: [birth year], 70 y.o.   MRN: [id number]     Clinical Summary Mr. [name] [name] [name] is a 70 y.o.male seen today for follow up of the following medical problems.   1. CAD  - prior STEMI in 2004, received stent to LAD and LCX  - 08/2011 Echo LVEF 50-55%  - Jan 2015 Lexiscan MPI no ischemia  - denies any chest pain. No SOB or DOE - compliant with meds  2. Hyperlipidemia - followed by pcp -Reports tried on more potent statins but caused muscle aches. Tolerating simva 40mg  daily well.    3. History of tobacco use - Abd [place] no aneurysm 2012.   4. HTN - compliant with meds Past Medical History  Diagnosis Date  . Hypertension     takes Losartan and Metoprolol daily  . Hyperlipidemia     takes Simvastatin nightly  . GERD (gastroesophageal reflux disease)     takes Omeprazole daily  . Coronary artery disease   . Myocardial infarct 2004  . Shortness of breath   . Asthma     with exertion  . History of bronchitis     many yrs ago  . URI (upper respiratory infection)     Dec 31,2014  . Vertigo     takes Meclizine daily as needed  . Arthritis   . Joint pain   . Back pain   . Paget disease of bone     takes Fosamax daily  . Hemorrhoids   . Colitis     just finished one antibiotic and Cipro will be finished up  . Diverticulosis   . History of colon polyps   . Anemia   . Cataracts, bilateral     immature  . COPD (chronic obstructive pulmonary disease)     no meds required     Allergies  Allergen Reactions  . Codeine Nausea And Vomiting     Current Outpatient Prescriptions  Medication Sig Dispense Refill  . aspirin 81 MG tablet Take 81 mg by mouth daily.    . cholecalciferol (VITAMIN D) 1000 UNITS tablet Take 2,000 Units by mouth daily.    . diazepam (VALIUM) 5 MG tablet Take 1 tablet (5 mg total) by mouth every 6 (six) hours as needed for muscle spasms. 60 tablet 0  . losartan (COZAAR) 50 MG tablet Take 1 tablet (50 mg total) by  mouth daily. 90 tablet 0  . metoprolol tartrate (LOPRESSOR) 25 MG tablet Take 0.5 tablets (12.5 mg total) by mouth 2 (two) times daily. 90 tablet 3  . Multiple Vitamin (MULTIVITAMIN) capsule Take 1 capsule by mouth daily.    . naproxen sodium (ANAPROX) 220 MG tablet Take 220 mg by mouth 2 (two) times daily as needed (for pain).    . nitroGLYCERIN (NITROSTAT) 0.4 MG SL tablet Place 1 tablet (0.4 mg total) under the tongue every 5 (five) minutes as needed. Chest pain 90 tablet 1  . omeprazole (PRILOSEC) 40 MG capsule Take 1 capsule (40 mg total) by mouth 2 (two) times daily. (Patient taking differently: Take 40 mg by mouth daily. ) 60 capsule 0  . oxyCODONE-acetaminophen (ROXICET) 5-325 MG per tablet Take 1 tablet by mouth every 4 (four) hours as needed. (Patient not taking: Reported on 12/23/2014) 60 tablet 0  . oxyCODONE-acetaminophen (ROXICET) 5-325 MG per tablet Take 1-2 tablets by mouth every 4 (four) hours as needed for moderate pain or severe pain. 60 tablet 0  . simvastatin (ZOCOR) 40 MG  tablet Take 1 tablet (40 mg total) by mouth at bedtime. 30 tablet 11   No current facility-administered medications for this visit.     Past Surgical History  Procedure Laterality Date  . Dental surgery    . Appendectomy    . Carpal tunnel release Right   . Abdominal exploration surgery    . Hernia repair      inguinal  . Joint replacement Left   . Bilateral knee arthroscopies      x 3  . Back surgery      x 2  . Coronary angioplasty      3 stents  . Colonoscopy    . Esophagogastroduodenoscopy    . Total knee arthroplasty Right 11/16/2013    DR Mayer Camel  . Total knee arthroplasty Right 11/16/2013    Procedure: TOTAL KNEE ARTHROPLASTY;  Surgeon: Kerin Salen, MD;  Location: [place];  Service: Orthopedics;  Laterality: Right;  . Left heart catheterization with coronary angiogram N/A 08/27/2011    Procedure: LEFT HEART CATHETERIZATION WITH CORONARY ANGIOGRAM;  Surgeon: Hillary Bow, MD;  Location:  [facility] CATH LAB;  Service: Cardiovascular;  Laterality: N/A;  . Total knee arthroplasty Left 01/12/02  . Lumbar laminectomy with coflex 2 level N/A 01/03/2015    Procedure: Lumbar three-four,Lumbar four-five Laminectomy with Coflex;  Surgeon: Kristeen Miss, MD;  Location: [place] NEURO ORS;  Service: Neurosurgery;  Laterality: N/A;     Allergies  Allergen Reactions  . Codeine Nausea And Vomiting      Family History Patient denies any significant history of heart disease   Social History [name] reports that he quit smoking about 17 years ago. He started smoking about 57 years ago. He has never used smokeless tobacco. Mr. [name] [name] [name] reports that he drinks alcohol.   Review of Systems CONSTITUTIONAL: No weight loss, fever, chills, weakness or fatigue.  HEENT: Eyes: No visual loss, blurred vision, double vision or yellow sclerae.No hearing loss, sneezing, congestion, runny nose or sore throat.  SKIN: No rash or itching.  CARDIOVASCULAR: per HPI RESPIRATORY: No shortness of breath, cough or sputum.  GASTROINTESTINAL: No anorexia, nausea, vomiting or diarrhea. No abdominal pain or blood.  GENITOURINARY: No burning on urination, no polyuria NEUROLOGICAL: No headache, dizziness, syncope, paralysis, ataxia, numbness or tingling in the extremities. No change in bowel or bladder control.  MUSCULOSKELETAL: No muscle, back pain, joint pain or stiffness.  LYMPHATICS: No enlarged nodes. No history of splenectomy.  PSYCHIATRIC: No history of depression or anxiety.  ENDOCRINOLOGIC: No reports of sweating, cold or heat intolerance. No polyuria or polydipsia.  Marland Kitchen   Physical Examination Filed Vitals:   11/16/15 1055  BP: 120/72  Pulse: 100   Filed Vitals:   11/16/15 1055  Height: 5\' 11"  (1.803 m)  Weight: 209 lb (94.802 kg)    Gen: resting comfortably, no acute distress HEENT: no scleral icterus, pupils equal round and reactive, no palptable cervical adenopathy,  CV: RRR, no m/r/g, no jvd Resp:  Clear to auscultation bilaterally GI: abdomen is soft, non-tender, non-distended, normal bowel sounds, no hepatosplenomegaly MSK: extremities are warm, no edema.  Skin: warm, no rash Neuro:  no focal deficits Psych: appropriate affect   Diagnostic Studies 08/2003 Cath  FINDINGS:  1. Left main trunk: Large caliber vessel with mild irregularities.  2. LAD: This begins as a large caliber vessel and tapers in the distal  section. Provides two diagonal branches. The proximal LAD has mild  disease of 30%. The mid LAD has eccentric plaque  of 50%. The distal LAD  has a high grade narrowing of 95% medially after the second diagonal  Ashan Cueva with TIMI 2 flow distally. The first diagonal Airiana Elman has an  ostial narrowing of 50-60%. The second diagonal Karenann Mcgrory has an ostial  narrowing of 30-40% and is a large caliber vessel.  3. Left circumflex artery: This is a medium caliber vessel. Provides  trivial first marginal Ladashia Demarinis in the mid section and two marginal  branches distally. The proximal AV circumflex has high grade narrowing  of 70-80%. There is then further narrowing of 50-60% after the first  marginal Annalina Needles. The first marginal Jaxden Blyden has long tubular narrowing of  50% in the proximal segment. The second marginal Saba Neuman has an ostial  narrowing of 80%. The third marginal Raveena Hebdon has proximal narrowing of  70%.  4. Right coronary artery: Dominant. This is a large caliber vessel.  Provides the posterior descending artery and posterior ventricular Nakeisha Greenhouse  in terminal segment. The right coronary artery has diffuse disease of 30-  40% in the mid and distal section.  5. LV: Normal end-systolic and end-diastolic dimensions. Overall left  ventricular function is well preserved. Ejection fraction greater than  55%. No mitral regurgitation. There is akinesis of a small portion of  the mid anterior wall. LV pressure is 110/10. Aortic is 110/60. LVEDP  equals 15.  DESCRIPTION  OF PROCEDURE: With these findings we elected to proceed with  percutaneous intervention to the distal LAD. The patient was given heparin  systemically as well as Reopro to maintain an ACT of approximately 300  seconds using 600 mg of Plavix. During the case he developed some itching  and was given Benadryl and Solu-Medrol with improvement of this. A 6-French  Q4 guide catheter was used to engage the left coronary artery and a 0.014  inch Asahi Prowater wire introduced. This was used to cross the stenosis  and position the distal LAD. A 2.5 x 15 mm Voyager balloon was introduced  and used to dilate the distal LAD lesion at 6 atmospheres for 120 seconds.  Repeat angiography showed an improvement in vessel lumen. However, there  was severe residual plaque and it was felt that further intervention would  be required. We introduced a 2.75 x 15 mm cutting balloon and a total of  six inflations were performed within the distal LAD at up to 10 atmospheres  for 60 seconds. Repeat angiography showed modest improvement in vessel  lumen. However, in the LAO cranial views there still appeared to be  residual narrowing of 70-80% at the origin of the stenosis. Some plaque  shifting had occurred into the diagonal Glennette Galster as well. While we tried to  avoid stent placement, this became unavoidable. We positioned a 2.5 x 18 mm  Cypher stent into the distal LAD and deployed this at 10 atmospheres for 30  seconds. A 2.75 x 15 mm Quantum Maverick balloon was then used to post  dilate the stent. Two inflations were performed up to 14 atmospheres for 30  seconds each. Repeat angiography was then performed after the  administration of intracoronary nitroglycerin showing excellent result with  no residual stenosis in the distal LAD and improvement of TIMI grade 2 to  TIMI grade 3 flow in the distal LAD. There was some plaque shift to the  origin of the second diagonal Keiji Melland of 70-80%. However,  there was still  TIMI 3 flow in this vessel. We decided to accept this result. Final  angiography was performed  in various projections showing no distal vessel  damage. The guide catheter was then removed. An AngioSeal closure device  was then deployed to the right femoral artery due to mild oozing around the  sheath. There was persistence of oozing and a FemStop was positioned until  hemostasis was achieved. The patient then transferred to the CCU in stable  condition. He tolerated the procedure well.  FINAL RESULTS: Successful PTCA and stent placement in the distal LAD with  reduction of 95% narrowing to 0% with placement of a 2.5 x 18 mm Cypher drug-  eluting stent dilated to 2.75 mm and improvement of TIMI grade 2 to TIMI  grade 3 flow.   08/2011 Echo  LVEF 99991111, grade I diastolic dysfunction, difficult study.   09/04/13 EKG  Normal sinus rhythm, no ischemic changes   Jan 2015 Stress MPI IMPRESSION: Low risk Lexiscan Cardiolite. No diagnostic ST segment changes were noted. Rare PACs observed. Perfusion imaging is consistent with diaphragmatic attenuation affecting the inferior wall, no clear evidence of scar or ischemia. LV volumes are normal and LVEF is 62% without wall motion abnormality.  09/25/14 Clinic EKG SR with PACs  11/16/15 Clinic EKG (performed and reviewed in clinic): NSR  Assessment and Plan  1. CAD  - no current symptoms - continue current meds  2 . Hyperlipidemia - will request most recent panel from pcp - reports muscle aches on more potent statins, continue simva 40  3. HTN - at goal, continue current meds   F/u 1 year      Arnoldo Lenis, M.D., F.A.C.C.

## 2015-12-02 ENCOUNTER — Other Ambulatory Visit: Payer: Self-pay | Admitting: Cardiology

## 2016-01-19 ENCOUNTER — Other Ambulatory Visit: Payer: Self-pay | Admitting: Cardiology

## 2016-02-01 ENCOUNTER — Other Ambulatory Visit: Payer: Self-pay | Admitting: *Deleted

## 2016-02-01 MED ORDER — SIMVASTATIN 40 MG PO TABS: 40.0000 mg | ORAL_TABLET | Freq: Every day | ORAL | Status: DC

## 2016-08-20 ENCOUNTER — Other Ambulatory Visit: Payer: Self-pay | Admitting: Cardiology

## 2017-02-04 ENCOUNTER — Other Ambulatory Visit: Payer: Self-pay | Admitting: Cardiology

## 2017-04-15 ENCOUNTER — Other Ambulatory Visit: Payer: Self-pay | Admitting: Cardiology

## 2017-07-09 ENCOUNTER — Encounter: Payer: Self-pay | Admitting: Cardiology

## 2017-07-09 ENCOUNTER — Ambulatory Visit (INDEPENDENT_AMBULATORY_CARE_PROVIDER_SITE_OTHER): Payer: Medicare Other | Admitting: Cardiology

## 2017-07-09 VITALS — BP 116/78 | HR 66 | Ht 70.0 in | Wt 210.0 lb

## 2017-07-09 DIAGNOSIS — I10 Essential (primary) hypertension: Secondary | ICD-10-CM | POA: Diagnosis not present

## 2017-07-09 DIAGNOSIS — R0602 Shortness of breath: Secondary | ICD-10-CM

## 2017-07-09 DIAGNOSIS — I2510 Atherosclerotic heart disease of native coronary artery without angina pectoris: Secondary | ICD-10-CM | POA: Diagnosis not present

## 2017-07-09 NOTE — Patient Instructions (Signed)
Your physician wants you to follow-up in: [place] will receive a reminder letter in the mail two months in advance. If you don't receive a letter, please call our office to schedule the follow-up appointment.  Your physician recommends that you continue on your current medications as directed. Please refer to the Current Medication list given to you today.  Your physician has recommended that you have a pulmonary function test. Pulmonary Function Tests are a group of tests that measure how well air moves in and out of your lungs.  Thank you for choosing [facility]!!

## 2017-07-09 NOTE — Progress Notes (Signed)
Clinical Summary [name] [name] is a 71 y.o.male seen today for follow up of the following medical problems.   1. CAD  - prior STEMI in 2004, received stent to LAD and LCX  - 08/2011 Echo LVEF 50-55%  - Jan 2015 Lexiscan MPI no ischemia   - no recent chest pain.  Some SOB with walking up stairs. Highest level of activity is yardwork which is tolerated fairly well - he remains compliant with meds  2. Hyperlipidemia - followed by pcp -Reports tried on more potent statins but caused muscle aches. Tolerating simva 40mg  daily well.   03/2017 TC 137 HDL 36 TG 159 LDL 69   3. History of tobacco use - Abd [place] no aneurysm 2012.  - some SOB as described above  4. HTN - he remains compliant with meds   Past Medical History:  Diagnosis Date  . Anemia   . Arthritis   . Asthma    with exertion  . Back pain   . Cataracts, bilateral    immature  . Colitis    just finished one antibiotic and Cipro will be finished up  . COPD (chronic obstructive pulmonary disease) (HCC)    no meds required  . Coronary artery disease   . Diverticulosis   . GERD (gastroesophageal reflux disease)    takes Omeprazole daily  . Hemorrhoids   . History of bronchitis    many yrs ago  . History of colon polyps   . Hyperlipidemia    takes Simvastatin nightly  . Hypertension    takes Losartan and Metoprolol daily  . Joint pain   . Myocardial infarct 2004  . Paget disease of bone    takes Fosamax daily  . Shortness of breath   . URI (upper respiratory infection)    Dec 31,2014  . Vertigo    takes Meclizine daily as needed     Allergies  Allergen Reactions  . Codeine Nausea And Vomiting     Current Outpatient Prescriptions  Medication Sig Dispense Refill  . aspirin 81 MG tablet Take 81 mg by mouth daily.    . cholecalciferol (VITAMIN D) 1000 UNITS tablet Take 2,000 Units by mouth daily.    Marland Kitchen losartan (COZAAR) 50 MG tablet TAKE ONE TABLET BY MOUTH DAILY 90 tablet 3  . metoprolol  tartrate (LOPRESSOR) 25 MG tablet TAKE 1/2 TABLET BY MOUTH TWICE DAILY 90 tablet 3  . Multiple Vitamin (MULTIVITAMIN) capsule Take 1 capsule by mouth daily.    . nitroGLYCERIN (NITROSTAT) 0.4 MG SL tablet Place 1 tablet (0.4 mg total) under the tongue every 5 (five) minutes as needed. Chest pain 90 tablet 1  . omeprazole (PRILOSEC) 40 MG capsule Take 1 capsule (40 mg total) by mouth 2 (two) times daily. (Patient taking differently: Take 40 mg by mouth daily. ) 60 capsule 0  . simvastatin (ZOCOR) 40 MG tablet TAKE ONE TABLET BY MOUTH AT BEDTIME. 90 tablet 3   No current facility-administered medications for this visit.      Past Surgical History:  Procedure Laterality Date  . ABDOMINAL EXPLORATION SURGERY    . APPENDECTOMY    . BACK SURGERY     x 2  . bilateral knee arthroscopies     x 3  . CARPAL TUNNEL RELEASE Right   . COLONOSCOPY    . CORONARY ANGIOPLASTY     3 stents  . DENTAL SURGERY    . ESOPHAGOGASTRODUODENOSCOPY    . HERNIA REPAIR  inguinal  . JOINT REPLACEMENT Left   . LEFT HEART CATHETERIZATION WITH CORONARY ANGIOGRAM N/A 08/27/2011   Procedure: LEFT HEART CATHETERIZATION WITH CORONARY ANGIOGRAM;  Surgeon: Hillary Bow, MD;  Location: [facility] CATH LAB;  Service: Cardiovascular;  Laterality: N/A;  . LUMBAR LAMINECTOMY WITH COFLEX 2 LEVEL N/A 01/03/2015   Procedure: Lumbar three-four,Lumbar four-five Laminectomy with Coflex;  Surgeon: Kristeen Miss, MD;  Location: [place] NEUR[facility] ORS;  Service: Neurosurgery;  Laterality: N/A;  . TOTAL KNEE ARTHROPLASTY Right 11/16/2013   DR Mayer Camel  . TOTAL KNEE ARTHROPLASTY Right 11/16/2013   Procedure: TOTAL KNEE ARTHROPLASTY;  Surgeon: Kerin Salen, MD;  Location: [place];  Service: Orthopedics;  Laterality: Right;  . TOTAL KNEE ARTHROPLASTY Left 01/12/02     Allergies  Allergen Reactions  . Codeine Nausea And Vomiting      No family history on file.   Social History [name] [name] reports that he quit smoking about 18 years ago. He  started smoking about 59 years ago. He has never used smokeless tobacco. [name] [name] reports that he drinks alcohol.   Review of Systems CONSTITUTIONAL: No weight loss, fever, chills, weakness or fatigue.  HEENT: Eyes: No visual loss, blurred vision, double vision or yellow sclerae.No hearing loss, sneezing, congestion, runny nose or sore throat.  SKIN: No rash or itching.  CARDIOVASCULAR: per hpi RESPIRATORY: No shortness of breath, cough or sputum.  GASTROINTESTINAL: No anorexia, nausea, vomiting or diarrhea. No abdominal pain or blood.  GENITOURINARY: No burning on urination, no polyuria NEUROLOGICAL: No headache, dizziness, syncope, paralysis, ataxia, numbness or tingling in the extremities. No change in bowel or bladder control.  MUSCULOSKELETAL: No muscle, back pain, joint pain or stiffness.  LYMPHATICS: No enlarged nodes. No history of splenectomy.  PSYCHIATRIC: No history of depression or anxiety.  ENDOCRINOLOGIC: No reports of sweating, cold or heat intolerance. No polyuria or polydipsia.  Marland Kitchen   Physical Examination Vitals:   07/09/17 1010  BP: 116/78  Pulse: 66  SpO2: 96%   Vitals:   07/09/17 1010  Weight: 210 lb (95.3 kg)  Height: 5\' 10"  (1.778 m)    Gen: resting comfortably, no acute distress HEENT: no scleral icterus, pupils equal round and reactive, no palptable cervical adenopathy,  CV: RRR, no m/rg, no jvd Resp:mild bilateral wheezing GI: abdomen is soft, non-tender, non-distended, normal bowel sounds, no hepatosplenomegaly MSK: extremities are warm, no edema.  Skin: warm, no rash Neuro:  no focal deficits Psych: appropriate affect   Diagnostic Studies 08/2003 Cath  FINDINGS:  1. Left main trunk: Large caliber vessel with mild irregularities.  2. LAD: This begins as a large caliber vessel and tapers in the distal  section. Provides two diagonal branches. The proximal LAD has mild  disease of 30%. The mid LAD has eccentric plaque of 50%. The distal  LAD  has a high grade narrowing of 95% medially after the second diagonal  [name] [name] with TIMI 2 flow distally. The first diagonal [name] [name] has an  ostial narrowing of 50-60%. The second diagonal [name] [name] has an ostial  narrowing of 30-40% and is a large caliber vessel.  3. Left circumflex artery: This is a medium caliber vessel. Provides  trivial first marginal Corey Caulfield in the mid section and two marginal  branches distally. The proximal AV circumflex has high grade narrowing  of 70-80%. There is then further narrowing of 50-60% after the first  marginal Jessah Danser. The first marginal Celest Reitz has long tubular narrowing of  50% in the proximal segment. The second marginal  Fuller Makin has an ostial  narrowing of 80%. The third marginal Jeanise Durfey has proximal narrowing of  70%.  4. Right coronary artery: Dominant. This is a large caliber vessel.  Provides the posterior descending artery and posterior ventricular Bradey Luzier  in terminal segment. The right coronary artery has diffuse disease of 30-  40% in the mid and distal section.  5. LV: Normal end-systolic and end-diastolic dimensions. Overall left  ventricular function is well preserved. Ejection fraction greater than  55%. No mitral regurgitation. There is akinesis of a small portion of  the mid anterior wall. LV pressure is 110/10. Aortic is 110/60. LVEDP  equals 15.  DESCRIPTION OF PROCEDURE: With these findings we elected to proceed with  percutaneous intervention to the distal LAD. The patient was given heparin  systemically as well as Reopro to maintain an ACT of approximately 300  seconds using 600 mg of Plavix. During the case he developed some itching  and was given Benadryl and Solu-Medrol with improvement of this. A 6-French  Q4 guide catheter was used to engage the left coronary artery and a 0.014  inch Asahi Prowater wire introduced. This was used to cross the stenosis  and position the distal LAD. A 2.5 x 15 mm Voyager  balloon was introduced  and used to dilate the distal LAD lesion at 6 atmospheres for 120 seconds.  Repeat angiography showed an improvement in vessel lumen. However, there  was severe residual plaque and it was felt that further intervention would  be required. We introduced a 2.75 x 15 mm cutting balloon and a total of  six inflations were performed within the distal LAD at up to 10 atmospheres  for 60 seconds. Repeat angiography showed modest improvement in vessel  lumen. However, in the LAO cranial views there still appeared to be  residual narrowing of 70-80% at the origin of the stenosis. Some plaque  shifting had occurred into the diagonal Lealon Vanputten as well. While we tried to  avoid stent placement, this became unavoidable. We positioned a 2.5 x 18 mm  Cypher stent into the distal LAD and deployed this at 10 atmospheres for 30  seconds. A 2.75 x 15 mm Quantum Maverick balloon was then used to post  dilate the stent. Two inflations were performed up to 14 atmospheres for 30  seconds each. Repeat angiography was then performed after the  administration of intracoronary nitroglycerin showing excellent result with  no residual stenosis in the distal LAD and improvement of TIMI grade 2 to  TIMI grade 3 flow in the distal LAD. There was some plaque shift to the  origin of the second diagonal Stockton Nunley of 70-80%. However, there was still  TIMI 3 flow in this vessel. We decided to accept this result. Final  angiography was performed in various projections showing no distal vessel  damage. The guide catheter was then removed. An AngioSeal closure device  was then deployed to the right femoral artery due to mild oozing around the  sheath. There was persistence of oozing and a FemStop was positioned until  hemostasis was achieved. The patient then transferred to the CCU in stable  condition. He tolerated the procedure well.  FINAL RESULTS: Successful PTCA and stent placement  in the distal LAD with  reduction of 95% narrowing to 0% with placement of a 2.5 x 18 mm Cypher drug-  eluting stent dilated to 2.75 mm and improvement of TIMI grade 2 to TIMI  grade 3 flow.   08/2011 Echo  LVEF  09-29%, grade I diastolic dysfunction, difficult study.   09/04/13 EKG  Normal sinus rhythm, no ischemic changes   Jan 2015 Stress MPI IMPRESSION: Low risk Lexiscan Cardiolite. No diagnostic ST segment changes were noted. Rare PACs observed. Perfusion imaging is consistent with diaphragmatic attenuation affecting the inferior wall, no clear evidence of scar or ischemia. LV volumes are normal and LVEF is 62% without wall motion abnormality.  09/25/14 Clinic EKG SR with PACs    Assessment and Plan   1. CAD  -asymptomatic, continue current meds - EKG in clinic shows SR, no ischemic changes  2 . Hyperlipidemia - reports muscle aches on more potent statins, continue simva 40  3. HTN - bp is at goal, continue current meds  4. History of tobacco/SOB - some symptoms of SOB, wheezing on exam - obtain PFTs   F/u 1 year     Arnoldo Lenis, M.D.

## 2017-07-17 ENCOUNTER — Ambulatory Visit (HOSPITAL_COMMUNITY)
Admission: RE | Admit: 2017-07-17 | Discharge: 2017-07-17 | Disposition: A | Discharge status: Home / Self Care | Payer: Medicare Other | Source: Ambulatory Visit | Attending: Cardiology | Admitting: Cardiology

## 2017-07-17 DIAGNOSIS — R0602 Shortness of breath: Secondary | ICD-10-CM | POA: Insufficient documentation

## 2017-07-17 LAB — PULMONARY FUNCTION TEST
DL/VA % pred: 62 %
DL/VA: 2.87 ml/min/mmHg/L
DLCO COR: 19.01 ml/min/mmHg
DLCO UNC % PRED: 58 %
DLCO cor % pred: 58 %
DLCO unc: 19.01 ml/min/mmHg
FEF 25-75 Post: 2.17 L/sec
FEF 25-75 Pre: 1.28 L/sec
FEF2575-%Change-Post: 68 %
FEF2575-%Pred-Post: 94 %
FEF2575-%Pred-Pre: 55 %
FEV1-%Change-Post: 16 %
FEV1-%Pred-Post: 88 %
FEV1-%Pred-Pre: 76 %
FEV1-POST: 2.77 L
FEV1-Pre: 2.38 L
FEV1FVC-%Change-Post: 6 %
FEV1FVC-%Pred-Pre: 88 %
FEV6-%Change-Post: 7 %
FEV6-%PRED-POST: 97 %
FEV6-%PRED-PRE: 90 %
FEV6-Post: 3.94 L
FEV6-Pre: 3.66 L
FEV6FVC-%Change-Post: -1 %
FEV6FVC-%PRED-POST: 104 %
FEV6FVC-%Pred-Pre: 106 %
FVC-%Change-Post: 9 %
FVC-%PRED-POST: 93 %
FVC-%Pred-Pre: 85 %
FVC-Post: 4.01 L
FVC-Pre: 3.67 L
POST FEV6/FVC RATIO: 98 %
PRE FEV1/FVC RATIO: 65 %
Post FEV1/FVC ratio: 69 %
Pre FEV6/FVC Ratio: 100 %
RV % pred: 470 %
RV: 11.87 L
TLC % PRED: 218 %
TLC: 15.38 L

## 2017-07-17 MED ORDER — ALBUTEROL SULFATE (2.5 MG/3ML) 0.083% IN NEBU
2.5000 mg | INHALATION_SOLUTION | Freq: Once | RESPIRATORY_TRACT | Status: AC
  Administered 2017-07-17: 2.5 mg via RESPIRATORY_TRACT

## 2017-07-24 ENCOUNTER — Telehealth: Payer: Self-pay | Admitting: *Deleted

## 2017-07-24 MED ORDER — TIOTROPIUM BROMIDE MONOHYDRATE 18 MCG IN CAPS: 18.0000 ug | ORAL_CAPSULE | Freq: Every day | RESPIRATORY_TRACT | 6 refills | Status: DC

## 2017-07-24 NOTE — Telephone Encounter (Signed)
Pt says he only gets SOB when he exerts himself but would like to try spiriva - will send rx to [facility] as requested and route results to pcp

## 2017-07-24 NOTE — Telephone Encounter (Signed)
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 If he is still having sob please start spiriva 20mcg daily   Zandra Abts MD

## 2017-07-25 ENCOUNTER — Telehealth: Payer: Self-pay | Admitting: *Deleted

## 2017-07-25 NOTE — Telephone Encounter (Signed)
PA for spiriva approved through 07/25/18 from [place] - sent for scanning

## 2017-07-26 ENCOUNTER — Telehealth: Payer: Self-pay | Admitting: Cardiology

## 2017-07-26 NOTE — Telephone Encounter (Signed)
Called pt., spoke with his wife. She stated she will call insurance and let us know which one they will cover.

## 2017-07-26 NOTE — Telephone Encounter (Signed)
Pt's wife called their pharmacy and was told that the inhaler "[employer]

## 2017-07-26 NOTE — Telephone Encounter (Signed)
Will forward to Dr. Branch. 

## 2017-07-26 NOTE — Telephone Encounter (Signed)
Is he able to get a list of preferred agents from his insurance company?   Zandra Abts MD

## 2017-07-26 NOTE — Telephone Encounter (Signed)
Please give pt's wife a call  °

## 2017-07-26 NOTE — Telephone Encounter (Signed)
Pt's wife called pharmacy and was told the inhaler "incruse" has the same medication as the spiriva with only a $44 per month cost verses a $180. Please advise.

## 2017-07-26 NOTE — Telephone Encounter (Signed)
Pt called stating he is unable to pay his copay for the tiotropium (SPIRIVA HANDIHALER) 18 MCG inhalation capsule [[id number]]  Would like to know if there's something more affordable for him to take.

## 2017-07-29 ENCOUNTER — Other Ambulatory Visit: Payer: Self-pay

## 2017-07-29 MED ORDER — UMECLIDINIUM BROMIDE 62.5 MCG/INH IN AEPB: 1.0000 | INHALATION_SPRAY | Freq: Every day | RESPIRATORY_TRACT | 3 refills | Status: DC

## 2017-07-29 NOTE — Telephone Encounter (Signed)
Can d/c spiriva, start incruse ellipita inhaler 62.72mcg inhaled daily   J Minetta Krisher MD

## 2017-07-29 NOTE — Telephone Encounter (Signed)
Called  inhaler in to pharmacy. ([facility])

## 2017-07-29 NOTE — Progress Notes (Unsigned)
Called in Incruse Ellipta inhaler. 62.5 mcg inhaled daily to [facility].

## 2017-08-22 ENCOUNTER — Other Ambulatory Visit: Payer: Self-pay | Admitting: Cardiology

## 2017-11-07 ENCOUNTER — Other Ambulatory Visit: Payer: Self-pay | Admitting: Cardiology

## 2018-02-03 ENCOUNTER — Other Ambulatory Visit: Payer: Self-pay | Admitting: Cardiology

## 2018-04-09 ENCOUNTER — Telehealth: Payer: Self-pay | Admitting: Cardiology

## 2018-04-09 NOTE — Telephone Encounter (Signed)
Pt aware.

## 2018-04-09 NOTE — Telephone Encounter (Signed)
LM to return call.

## 2018-04-09 NOTE — Telephone Encounter (Signed)
Dr Blanch Media gave pt prednisone pack 4mg  for 5 days and suggested pt confirm with Dr Harl Bowie if ok to take

## 2018-04-09 NOTE — Telephone Encounter (Signed)
Please give pt a call concerning medication his foot Dr, Dr. Blanch Media has put him on. Please call [phone]

## 2018-04-09 NOTE — Telephone Encounter (Signed)
Prednisone is ok   Zandra Abts MD

## 2018-05-20 ENCOUNTER — Other Ambulatory Visit: Payer: Self-pay | Admitting: Cardiology

## 2018-07-09 ENCOUNTER — Ambulatory Visit: Payer: Medicare Other | Admitting: Cardiology

## 2018-07-11 ENCOUNTER — Encounter: Payer: Self-pay | Admitting: Cardiology

## 2018-07-11 ENCOUNTER — Ambulatory Visit (INDEPENDENT_AMBULATORY_CARE_PROVIDER_SITE_OTHER): Payer: Medicare Other | Admitting: Cardiology

## 2018-07-11 VITALS — BP 116/68 | HR 75 | Ht 70.0 in | Wt 220.4 lb

## 2018-07-11 DIAGNOSIS — I10 Essential (primary) hypertension: Secondary | ICD-10-CM

## 2018-07-11 DIAGNOSIS — I2510 Atherosclerotic heart disease of native coronary artery without angina pectoris: Secondary | ICD-10-CM

## 2018-07-11 DIAGNOSIS — E782 Mixed hyperlipidemia: Secondary | ICD-10-CM | POA: Diagnosis not present

## 2018-07-11 NOTE — Patient Instructions (Signed)

## 2018-07-11 NOTE — Progress Notes (Signed)
Clinical Summary [name] [name] is a 72 y.o.male  seen today for follow up of the following medical problems.   1. CAD  - prior STEMI in 2004, received stent to LAD and LCX  - 08/2011 Echo LVEF 50-55%  - Jan 2015 Lexiscan MPI no ischemia  - no recent chest pain. Chronic SOB/DOE unchanged, overall mild - compliant with meds  2. Hyperlipidemia - followed by pcp -Reports tried on more potent statins but caused muscle aches. Tolerating simva 40mg  daily well.   03/2017 TC 137 HDL 36 TG 159 LDL 69  - upcoming labs with pcp   3. HTN - compliant with meds   4. COPD - abnormal PFTs 06/2017, managed by pcp  Past Medical History:  Diagnosis Date  . Anemia   . Arthritis   . Asthma    with exertion  . Back pain   . Cataracts, bilateral    immature  . Colitis    just finished one antibiotic and Cipro will be finished up  . COPD (chronic obstructive pulmonary disease) (HCC)    no meds required  . Coronary artery disease   . Diverticulosis   . GERD (gastroesophageal reflux disease)    takes Omeprazole daily  . Hemorrhoids   . History of bronchitis    many yrs ago  . History of colon polyps   . Hyperlipidemia    takes Simvastatin nightly  . Hypertension    takes Losartan and Metoprolol daily  . Joint pain   . Myocardial infarct ([place]) 2004  . Paget disease of bone    takes Fosamax daily  . Shortness of breath   . URI (upper respiratory infection)    Dec 31,2014  . Vertigo    takes Meclizine daily as needed     Allergies  Allergen Reactions  . Codeine Nausea And Vomiting     Current Outpatient Medications  Medication Sig Dispense Refill  . aspirin 81 MG tablet Take 81 mg by mouth daily.    . cholecalciferol (VITAMIN D) 1000 UNITS tablet Take 2,000 Units by mouth daily.    Marland Kitchen losartan (COZAAR) 50 MG tablet TAKE ONE TABLET BY MOUTH DAILY. 90 tablet 1  . metoprolol tartrate (LOPRESSOR) 25 MG tablet TAKE 1/2 TABLET BY MOUTH TWICE DAILY 90 tablet 3  .  Multiple Vitamin (MULTIVITAMIN) capsule Take 1 capsule by mouth daily.    . nitroGLYCERIN (NITROSTAT) 0.4 MG SL tablet Place 1 tablet (0.4 mg total) under the tongue every 5 (five) minutes as needed. Chest pain 90 tablet 1  . omeprazole (PRILOSEC) 40 MG capsule Take 1 capsule (40 mg total) by mouth 2 (two) times daily. (Patient taking differently: Take 40 mg by mouth daily. ) 60 capsule 0  . simvastatin (ZOCOR) 40 MG tablet TAKE ONE TABLET BY MOUTH AT BEDTIME. 90 tablet 1  . umeclidinium bromide (INCRUSE ELLIPTA) 62.5 MCG/INH AEPB Inhale 1 puff daily into the lungs. 1 each 3   No current facility-administered medications for this visit.      Past Surgical History:  Procedure Laterality Date  . ABDOMINAL EXPLORATION SURGERY    . APPENDECTOMY    . BACK SURGERY     x 2  . bilateral knee arthroscopies     x 3  . CARPAL TUNNEL RELEASE Right   . COLONOSCOPY    . CORONARY ANGIOPLASTY     3 stents  . DENTAL SURGERY    . ESOPHAGOGASTRODUODENOSCOPY    . HERNIA REPAIR  inguinal  . JOINT REPLACEMENT Left   . LEFT HEART CATHETERIZATION WITH CORONARY ANGIOGRAM N/A 08/27/2011   Procedure: LEFT HEART CATHETERIZATION WITH CORONARY ANGIOGRAM;  Surgeon: Hillary Bow, MD;  Location: [facility] C[facility] LAB;  Service: Cardiovascular;  Laterality: N/A;  . LUMBAR LAMINECTOMY WITH COFLEX 2 LEVEL N/A 01/03/2015   Procedure: Lumbar three-four,Lumbar four-five Laminectomy with Coflex;  Surgeon: Kristeen Miss, MD;  Location: [place] NEURO ORS;  Service: Neurosurgery;  Laterality: N/A;  . TOTAL KNEE ARTHROPLASTY Right 11/16/2013   DR Mayer Camel  . TOTAL KNEE ARTHROPLASTY Right 11/16/2013   Procedure: TOTAL KNEE ARTHROPLASTY;  Surgeon: Kerin Salen, MD;  Location: [place];  Service: Orthopedics;  Laterality: Right;  . TOTAL KNEE ARTHROPLASTY Left 01/12/02     Allergies  Allergen Reactions  . Codeine Nausea And Vomiting      No family history on file.   Social History [name] [name] reports that he quit smoking about 19  years ago. He started smoking about 60 years ago. He has never used smokeless tobacco. [name] [name] reports that he drinks alcohol.   Review of Systems CONSTITUTIONAL: No weight loss, fever, chills, weakness or fatigue.  HEENT: Eyes: No visual loss, blurred vision, double vision or yellow sclerae.No hearing loss, sneezing, congestion, runny nose or sore throat.  SKIN: No rash or itching.  CARDIOVASCULAR: per hpi RESPIRATORY: per hpi GASTROINTESTINAL: No anorexia, nausea, vomiting or diarrhea. No abdominal pain or blood.  GENITOURINARY: No burning on urination, no polyuria NEUROLOGICAL: No headache, dizziness, syncope, paralysis, ataxia, numbness or tingling in the extremities. No change in bowel or bladder control.  MUSCULOSKELETAL: No muscle, back pain, joint pain or stiffness.  LYMPHATICS: No enlarged nodes. No history of splenectomy.  PSYCHIATRIC: No history of depression or anxiety.  ENDOCRINOLOGIC: No reports of sweating, cold or heat intolerance. No polyuria or polydipsia.  Marland Kitchen   Physical Examination Vitals:   07/11/18 1412  BP: 116/68  Pulse: 75  SpO2: 92%   Vitals:   07/11/18 1412  Weight: 220 lb 6.4 oz (100 kg)  Height: 5\' 10"  (1.778 m)    Gen: resting comfortably, no acute distress HEENT: no scleral icterus, pupils equal round and reactive, no palptable cervical adenopathy,  CV: RRR, no mrg, no jvd Resp: Clear to auscultation bilaterally GI: abdomen is soft, non-tender, non-distended, normal bowel sounds, no hepatosplenomegaly MSK: extremities are warm, no edema.  Skin: warm, no rash Neuro:  no focal deficits Psych: appropriate affect   Diagnostic Studies  08/2003 Cath FINDINGS:  1. Left main trunk: Large caliber vessel with mild irregularities.  2. LAD: This begins as a large caliber vessel and tapers in the distal  section. Provides two diagonal branches. The proximal LAD has mild  disease of 30%. The mid LAD has eccentric plaque of 50%. The distal LAD   has a high grade narrowing of 95% medially after the second diagonal  branch with TIMI 2 flow distally. The first diagonal branch has an  ostial narrowing of 50-60%. The second diagonal branch has an ostial  narrowing of 30-40% and is a large caliber vessel.  3. Left circumflex artery: This is a medium caliber vessel. Provides  trivial first marginal branch in the mid section and two marginal  branches distally. The proximal AV circumflex has high grade narrowing  of 70-80%. There is then further narrowing of 50-60% after the first  marginal branch. The first marginal branch has long tubular narrowing of  50% in the proximal segment. The second marginal branch has  an ostial  narrowing of 80%. The third marginal branch has proximal narrowing of  70%.  4. Right coronary artery: Dominant. This is a large caliber vessel.  Provides the posterior descending artery and posterior ventricular branch  in terminal segment. The right coronary artery has diffuse disease of 30-  40% in the mid and distal section.  5. LV: Normal end-systolic and end-diastolic dimensions. Overall left  ventricular function is well preserved. Ejection fraction greater than  55%. No mitral regurgitation. There is akinesis of a small portion of  the mid anterior wall. LV pressure is 110/10. Aortic is 110/60. LVEDP  equals 15.  DESCRIPTION OF PROCEDURE: With these findings we elected to proceed with  percutaneous intervention to the distal LAD. The patient was given heparin  systemically as well as Reopro to maintain an ACT of approximately 300  seconds using 600 mg of Plavix. During the case he developed some itching  and was given Benadryl and Solu-Medrol with improvement of this. A 6-French  Q4 guide catheter was used to engage the left coronary artery and a 0.014  inch Asahi Prowater wire introduced. This was used to cross the stenosis  and position the distal LAD. A 2.5 x 15 mm Voyager  balloon was introduced  and used to dilate the distal LAD lesion at 6 atmospheres for 120 seconds.  Repeat angiography showed an improvement in vessel lumen. However, there  was severe residual plaque and it was felt that further intervention would  be required. We introduced a 2.75 x 15 mm cutting balloon and a total of  six inflations were performed within the distal LAD at up to 10 atmospheres  for 60 seconds. Repeat angiography showed modest improvement in vessel  lumen. However, in the LAO cranial views there still appeared to be  residual narrowing of 70-80% at the origin of the stenosis. Some plaque  shifting had occurred into the diagonal branch as well. While we tried to  avoid stent placement, this became unavoidable. We positioned a 2.5 x 18 mm  Cypher stent into the distal LAD and deployed this at 10 atmospheres for 30  seconds. A 2.75 x 15 mm Quantum Maverick balloon was then used to post  dilate the stent. Two inflations were performed up to 14 atmospheres for 30  seconds each. Repeat angiography was then performed after the  administration of intracoronary nitroglycerin showing excellent result with  no residual stenosis in the distal LAD and improvement of TIMI grade 2 to  TIMI grade 3 flow in the distal LAD. There was some plaque shift to the  origin of the second diagonal branch of 70-80%. However, there was still  TIMI 3 flow in this vessel. We decided to accept this result. Final  angiography was performed in various projections showing no distal vessel  damage. The guide catheter was then removed. An AngioSeal closure device  was then deployed to the right femoral artery due to mild oozing around the  sheath. There was persistence of oozing and a FemStop was positioned until  hemostasis was achieved. The patient then transferred to the CCU in stable  condition. He tolerated the procedure well.  FINAL RESULTS: Successful PTCA and stent placement  in the distal LAD with  reduction of 95% narrowing to 0% with placement of a 2.5 x 18 mm Cypher drug-  eluting stent dilated to 2.75 mm and improvement of TIMI grade 2 to TIMI  grade 3 flow.   08/2011 Echo LVEF 50-55%, grade I  diastolic dysfunction, difficult study.   09/04/13 EKG Normal sinus rhythm, no ischemic changes   Jan 2015 Stress MPI IMPRESSION: Low risk Lexiscan Cardiolite. No diagnostic ST segment changes were noted. Rare PACs observed. Perfusion imaging is consistent with diaphragmatic attenuation affecting the inferior wall, no clear evidence of scar or ischemia. LV volumes are normal and LVEF is 62% without wall motion abnormality.  09/25/14 Clinic EKG SR with PACs   06/2017 PFTs Mild ventilatory defect, +air trapping, moderately reduced DLCO   Assessment and Plan   1. CAD  -no symptoms, continue current meds - EKG today SR, no acute ischemic changes  2 . Hyperlipidemia - reports muscle aches on more potent statins - continue simva 40, f/u labs from pcp  3. HTN - at goal, continue current meds     F/u 1 year     Arnoldo Lenis, M.D.

## 2018-08-01 ENCOUNTER — Other Ambulatory Visit (HOSPITAL_COMMUNITY): Payer: Self-pay | Admitting: Family Medicine

## 2018-08-01 DIAGNOSIS — Z136 Encounter for screening for cardiovascular disorders: Secondary | ICD-10-CM

## 2018-08-11 ENCOUNTER — Ambulatory Visit (HOSPITAL_COMMUNITY)
Admission: RE | Admit: 2018-08-11 | Discharge: 2018-08-11 | Disposition: A | Discharge status: Home / Self Care | Payer: Medicare Other | Source: Ambulatory Visit | Attending: Family Medicine | Admitting: Family Medicine

## 2018-08-11 DIAGNOSIS — Z136 Encounter for screening for cardiovascular disorders: Secondary | ICD-10-CM | POA: Insufficient documentation

## 2018-08-18 ENCOUNTER — Other Ambulatory Visit: Payer: Self-pay | Admitting: Cardiology

## 2018-11-14 ENCOUNTER — Other Ambulatory Visit: Payer: Self-pay | Admitting: Cardiology

## 2019-02-05 ENCOUNTER — Other Ambulatory Visit: Payer: Self-pay | Admitting: Cardiology

## 2019-02-10 ENCOUNTER — Other Ambulatory Visit: Payer: Self-pay | Admitting: Cardiology

## 2019-07-23 ENCOUNTER — Other Ambulatory Visit: Payer: Self-pay

## 2019-07-23 ENCOUNTER — Ambulatory Visit (INDEPENDENT_AMBULATORY_CARE_PROVIDER_SITE_OTHER): Payer: Medicare Other | Admitting: Cardiology

## 2019-07-23 ENCOUNTER — Encounter: Payer: Self-pay | Admitting: Cardiology

## 2019-07-23 VITALS — BP 146/83 | HR 60 | Ht 70.0 in | Wt 206.4 lb

## 2019-07-23 DIAGNOSIS — E782 Mixed hyperlipidemia: Secondary | ICD-10-CM | POA: Diagnosis not present

## 2019-07-23 DIAGNOSIS — I2510 Atherosclerotic heart disease of native coronary artery without angina pectoris: Secondary | ICD-10-CM | POA: Diagnosis not present

## 2019-07-23 DIAGNOSIS — I10 Essential (primary) hypertension: Secondary | ICD-10-CM | POA: Diagnosis not present

## 2019-07-23 MED ORDER — LOSARTAN POTASSIUM 50 MG PO TABS: 50.0000 mg | ORAL_TABLET | Freq: Every day | ORAL | 1 refills | Status: DC

## 2019-07-23 MED ORDER — NITROGLYCERIN 0.4 MG SL SUBL: 0.4000 mg | SUBLINGUAL_TABLET | SUBLINGUAL | 3 refills | Status: AC | PRN

## 2019-07-23 NOTE — Addendum Note (Signed)
Addended by: Julian Hy T on: 07/23/2019 02:13 PM   Modules accepted: Orders

## 2019-07-23 NOTE — Patient Instructions (Signed)

## 2019-07-23 NOTE — Progress Notes (Signed)
Clinical Summary Mr. Koeppe is a 73 y.o.male seen today for follow up of the following medical problems.   1. CAD  - prior STEMI in 2004, received stent to LAD and LCX  - 08/2011 Echo LVEF 50-55%  - Jan 2015 Lexiscan MPI no ischemia  - no recent chest pain. No SOB/DOE - compliant with meds   2. Hyperlipidemia - followed by pcp -Reports tried on more potent statins but caused muscle aches. Tolerating simva 40mg  daily well.   03/2017 TC 137 HDL 36 TG 159 LDL 69 07/2018 TC 147 TG 175 HDL 35 LDL 77   3. HTN -compliant with meds   4. COPD/Tobacco history - abnormal PFTs 06/2017, managed by pcp - 07/2018 no AAA  5. Vertigo - feeling of fullness in ears, dizzienss with head position changes while laying down - asked to discuss with pcp  Past Medical History:  Diagnosis Date  . Anemia   . Arthritis   . Asthma    with exertion  . Back pain   . Cataracts, bilateral    immature  . Colitis    just finished one antibiotic and Cipro will be finished up  . COPD (chronic obstructive pulmonary disease) (HCC)    no meds required  . Coronary artery disease   . Diverticulosis   . GERD (gastroesophageal reflux disease)    takes Omeprazole daily  . Hemorrhoids   . History of bronchitis    many yrs ago  . History of colon polyps   . Hyperlipidemia    takes Simvastatin nightly  . Hypertension    takes Losartan and Metoprolol daily  . Joint pain   . Myocardial infarct ([place]) 2004  . Paget disease of bone    takes Fosamax daily  . Shortness of breath   . URI (upper respiratory infection)    Dec 31,2014  . Vertigo    takes Meclizine daily as needed     Allergies  Allergen Reactions  . Codeine Nausea And Vomiting     Current Outpatient Medications  Medication Sig Dispense Refill  . albuterol (PROVENTIL HFA;VENTOLIN HFA) 108 (90 Base) MCG/ACT inhaler Inhale 2 puffs into the lungs as needed for wheezing or shortness of breath.    Marland Kitchen aspirin 81 MG  tablet Take 81 mg by mouth daily.    . cholecalciferol (VITAMIN D) 1000 UNITS tablet Take 2,000 Units by mouth daily.    Marland Kitchen losartan (COZAAR) 50 MG tablet TAKE ONE TABLET BY MOUTH DAILY. 90 tablet 1  . metoprolol tartrate (LOPRESSOR) 25 MG tablet TAKE 1/2 TABLET BY MOUTH TWICE DAILY. 90 tablet 3  . Multiple Vitamin (MULTIVITAMIN) capsule Take 1 capsule by mouth daily.    . nitroGLYCERIN (NITROSTAT) 0.4 MG SL tablet Place 1 tablet (0.4 mg total) under the tongue every 5 (five) minutes as needed. Chest pain 90 tablet 1  . omeprazole (PRILOSEC) 40 MG capsule Take 40 mg by mouth daily.    . simvastatin (ZOCOR) 40 MG tablet TAKE ONE TABLET BY MOUTH AT BEDTIME. 90 tablet 1   No current facility-administered medications for this visit.      Past Surgical History:  Procedure Laterality Date  . ABDOMINAL EXPLORATION SURGERY    . APPENDECTOMY    . BACK SURGERY     x 2  . bilateral knee arthroscopies     x 3  . CARPAL TUNNEL RELEASE Right   . COLONOSCOPY    . CORONARY ANGIOPLASTY     3  stents  . DENTAL SURGERY    . ESOPHAGOGASTRODUODENOSCOPY    . HERNIA REPAIR     inguinal  . JOINT REPLACEMENT Left   . LEFT HEART CATHETERIZATION WITH CORONARY ANGIOGRAM N/A 08/27/2011   Procedure: LEFT HEART CATHETERIZATION WITH CORONARY ANGIOGRAM;  Surgeon: Hillary Bow, MD;  Location: [facility] CATH LAB;  Service: Cardiovascular;  Laterality: N/A;  . LUMBAR LAMINECTOMY WITH COFLEX 2 LEVEL N/A 01/03/2015   Procedure: Lumbar three-four,Lumbar four-five Laminectomy with Coflex;  Surgeon: Kristeen Miss, MD;  Location: [place] NEURO ORS;  Service: Neurosurgery;  Laterality: N/A;  . TOTAL KNEE ARTHROPLASTY Right 11/16/2013   DR Mayer Camel  . TOTAL KNEE ARTHROPLASTY Right 11/16/2013   Procedure: TOTAL KNEE ARTHROPLASTY;  Surgeon: Kerin Salen, MD;  Location: [place];  Service: Orthopedics;  Laterality: Right;  . TOTAL KNEE ARTHROPLASTY Left 01/12/02     Allergies  Allergen Reactions  . Codeine Nausea And Vomiting       Family History  Problem Relation Age of Onset  . Heart attack Mother   . Diabetes Mother   . Emphysema Father   . Atrial fibrillation Sister   . Heart attack Brother   . Heart attack Brother   . Heart disease Brother   . Breast cancer Sister      Social History Mr. Blaschko reports that he quit smoking about 20 years ago. He started smoking about 61 years ago. He has never used smokeless tobacco. Mr. Mckinney reports current alcohol use.   Review of Systems CONSTITUTIONAL: No weight loss, fever, chills, weakness or fatigue.  HEENT: Eyes: No visual loss, blurred vision, double vision or yellow sclerae.No hearing loss, sneezing, congestion, runny nose or sore throat.  SKIN: No rash or itching.  CARDIOVASCULAR: per hpi RESPIRATORY: No shortness of breath, cough or sputum.  GASTROINTESTINAL: No anorexia, nausea, vomiting or diarrhea. No abdominal pain or blood.  GENITOURINARY: No burning on urination, no polyuria NEUROLOGICAL: per hpi MUSCULOSKELETAL: No muscle, back pain, joint pain or stiffness.  LYMPHATICS: No enlarged nodes. No history of splenectomy.  PSYCHIATRIC: No history of depression or anxiety.  ENDOCRINOLOGIC: No reports of sweating, cold or heat intolerance. No polyuria or polydipsia.  Marland Kitchen   Physical Examination Vitals:   07/23/19 0903  BP: (!) 146/83  Pulse: 60  SpO2: 97%   Filed Weights   07/23/19 0903  Weight: 206 lb 6.4 oz (93.6 kg)    Gen: resting comfortably, no acute distress HEENT: no scleral icterus, pupils equal round and reactive, no palptable cervical adenopathy,  CV: RRR, no m/rg, no jvd Resp: Clear to auscultation bilaterally GI: abdomen is soft, non-tender, non-distended, normal bowel sounds, no hepatosplenomegaly MSK: extremities are warm, no edema.  Skin: warm, no rash Neuro:  no focal deficits Psych: appropriate affect   Diagnostic Studies  08/2003 Cath FINDINGS:  1. Left main trunk: Large caliber vessel with mild irregularities.   2. LAD: This begins as a large caliber vessel and tapers in the distal  section. Provides two diagonal branches. The proximal LAD has mild  disease of 30%. The mid LAD has eccentric plaque of 50%. The distal LAD  has a high grade narrowing of 95% medially after the second diagonal  Lurine Imel with TIMI 2 flow distally. The first diagonal Ermalinda Joubert has an  ostial narrowing of 50-60%. The second diagonal Leeyah Heather has an ostial  narrowing of 30-40% and is a large caliber vessel.  3. Left circumflex artery: This is a medium caliber vessel. Provides  trivial first marginal Elasia Furnish  in the mid section and two marginal  branches distally. The proximal AV circumflex has high grade narrowing  of 70-80%. There is then further narrowing of 50-60% after the first  marginal Laniesha Das. The first marginal Diamone Whistler has long tubular narrowing of  50% in the proximal segment. The second marginal Iosefa Weintraub has an ostial  narrowing of 80%. The third marginal Dylan Ruotolo has proximal narrowing of  70%.  4. Right coronary artery: Dominant. This is a large caliber vessel.  Provides the posterior descending artery and posterior ventricular Elohim Brune  in terminal segment. The right coronary artery has diffuse disease of 30-  40% in the mid and distal section.  5. LV: Normal end-systolic and end-diastolic dimensions. Overall left  ventricular function is well preserved. Ejection fraction greater than  55%. No mitral regurgitation. There is akinesis of a small portion of  the mid anterior wall. LV pressure is 110/10. Aortic is 110/60. LVEDP  equals 15.  DESCRIPTION OF PROCEDURE: With these findings we elected to proceed with  percutaneous intervention to the distal LAD. The patient was given heparin  systemically as well as Reopro to maintain an ACT of approximately 300  seconds using 600 mg of Plavix. During the case he developed some itching  and was given Benadryl and Solu-Medrol with improvement of this. A  6-French  Q4 guide catheter was used to engage the left coronary artery and a 0.014  inch Asahi Prowater wire introduced. This was used to cross the stenosis  and position the distal LAD. A 2.5 x 15 mm Voyager balloon was introduced  and used to dilate the distal LAD lesion at 6 atmospheres for 120 seconds.  Repeat angiography showed an improvement in vessel lumen. However, there  was severe residual plaque and it was felt that further intervention would  be required. We introduced a 2.75 x 15 mm cutting balloon and a total of  six inflations were performed within the distal LAD at up to 10 atmospheres  for 60 seconds. Repeat angiography showed modest improvement in vessel  lumen. However, in the LAO cranial views there still appeared to be  residual narrowing of 70-80% at the origin of the stenosis. Some plaque  shifting had occurred into the diagonal Delquan Poucher as well. While we tried to  avoid stent placement, this became unavoidable. We positioned a 2.5 x 18 mm  Cypher stent into the distal LAD and deployed this at 10 atmospheres for 30  seconds. A 2.75 x 15 mm Quantum Maverick balloon was then used to post  dilate the stent. Two inflations were performed up to 14 atmospheres for 30  seconds each. Repeat angiography was then performed after the  administration of intracoronary nitroglycerin showing excellent result with  no residual stenosis in the distal LAD and improvement of TIMI grade 2 to  TIMI grade 3 flow in the distal LAD. There was some plaque shift to the  origin of the second diagonal Zayah Keilman of 70-80%. However, there was still  TIMI 3 flow in this vessel. We decided to accept this result. Final  angiography was performed in various projections showing no distal vessel  damage. The guide catheter was then removed. An AngioSeal closure device  was then deployed to the right femoral artery due to mild oozing around the  sheath. There was persistence of  oozing and a FemStop was positioned until  hemostasis was achieved. The patient then transferred to the CCU in stable  condition. He tolerated the procedure well.  FINAL RESULTS:  Successful PTCA and stent placement in the distal LAD with  reduction of 95% narrowing to 0% with placement of a 2.5 x 18 mm Cypher drug-  eluting stent dilated to 2.75 mm and improvement of TIMI grade 2 to TIMI  grade 3 flow.   08/2011 Echo LVEF 99991111, grade I diastolic dysfunction, difficult study.   09/04/13 EKG Normal sinus rhythm, no ischemic changes   Jan 2015 Stress MPI IMPRESSION: Low risk Lexiscan Cardiolite. No diagnostic ST segment changes were noted. Rare PACs observed. Perfusion imaging is consistent with diaphragmatic attenuation affecting the inferior wall, no clear evidence of scar or ischemia. LV volumes are normal and LVEF is 62% without wall motion abnormality.  09/25/14 Clinic EKG SR with PACs   06/2017 PFTs Mild ventilatory defect, +air trapping, moderately reduced DLCO    Assessment and Plan    1. CAD  -denies any symptoms, continue curren tmeds - EKG today SR, no ischemic changes    2 . Hyperlipidemia - reports muscle aches on more potent statins - reasonable LDL given dosing limitations, continue current therapy  3. HTN - manual recheck 130/75, at goal. Continue current meds  F/u 1 year   Arnoldo Lenis, M.D

## 2019-08-06 ENCOUNTER — Other Ambulatory Visit: Payer: Self-pay | Admitting: Cardiology

## 2019-11-04 ENCOUNTER — Other Ambulatory Visit: Payer: Self-pay | Admitting: Cardiology

## 2020-02-04 ENCOUNTER — Other Ambulatory Visit: Payer: Self-pay | Admitting: Cardiology

## 2020-02-06 ENCOUNTER — Other Ambulatory Visit: Payer: Self-pay | Admitting: Cardiology

## 2020-05-03 ENCOUNTER — Other Ambulatory Visit: Payer: Self-pay | Admitting: Cardiology

## 2020-07-26 ENCOUNTER — Ambulatory Visit: Payer: Medicare Other | Admitting: Cardiology

## 2020-07-26 ENCOUNTER — Encounter: Payer: Self-pay | Admitting: Cardiology

## 2020-07-26 VITALS — BP 122/64 | HR 82 | Ht 68.0 in | Wt 204.0 lb

## 2020-07-26 DIAGNOSIS — I2510 Atherosclerotic heart disease of native coronary artery without angina pectoris: Secondary | ICD-10-CM | POA: Diagnosis not present

## 2020-07-26 DIAGNOSIS — I10 Essential (primary) hypertension: Secondary | ICD-10-CM | POA: Diagnosis not present

## 2020-07-26 DIAGNOSIS — E782 Mixed hyperlipidemia: Secondary | ICD-10-CM

## 2020-07-26 NOTE — Patient Instructions (Signed)

## 2020-07-26 NOTE — Progress Notes (Signed)
Clinical Summary [name] [name] is a 74 y.o.male seen today for follow up of the following medical problems.   1. CAD  - prior STEMI in 2004, received stent to LAD and LCX  - 08/2011 Echo LVEF 50-55%  - Jan 2015 Lexiscan MPI no ischemia    - no recent chest pain, no SOB/DOE - compliant with meds   2. Hyperlipidemia - followed by pcp -Reports tried on more potent statins but caused muscle aches. Tolerating simva 40mg  daily well.    08/2019 TC 139 TG 130 HDL 38 LDL 78  3. HTN -he is compliant with meds   4. COPD/Tobacco history - abnormal PFTs 06/2017,managed by pcp - 07/2018 no AAA     Has had moderna covid vaccine x 3   Past Medical History:  Diagnosis Date  . Anemia   . Arthritis   . Asthma    with exertion  . Back pain   . Cataracts, bilateral    immature  . Colitis    just finished one antibiotic and Cipro will be finished up  . COPD (chronic obstructive pulmonary disease) (HCC)    no meds required  . Coronary artery disease   . Diverticulosis   . GERD (gastroesophageal reflux disease)    takes Omeprazole daily  . Hemorrhoids   . History of bronchitis    many yrs ago  . History of colon polyps   . Hyperlipidemia    takes Simvastatin nightly  . Hypertension    takes Losartan and Metoprolol daily  . Joint pain   . Myocardial infarct ([place]) 2004  . Paget disease of bone    takes Fosamax daily  . Shortness of breath   . URI (upper respiratory infection)    Dec 31,2014  . Vertigo    takes Meclizine daily as needed     Allergies  Allergen Reactions  . Codeine Nausea And Vomiting     Current Outpatient Medications  Medication Sig Dispense Refill  . albuterol (PROVENTIL HFA;VENTOLIN HFA) 108 (90 Base) MCG/ACT inhaler Inhale 2 puffs into the lungs as needed for wheezing or shortness of breath.    Marland Kitchen aspirin 81 MG tablet Take 81 mg by mouth daily.    . cetirizine (ZYRTEC) 10 MG tablet Take 10 mg by mouth daily.    .  cholecalciferol (VITAMIN D) 1000 UNITS tablet Take 2,000 Units by mouth daily.    Marland Kitchen losartan (COZAAR) 50 MG tablet TAKE ONE TABLET BY MOUTH EVERY DAY 90 tablet 3  . metoprolol tartrate (LOPRESSOR) 25 MG tablet TAKE 1/2 TABLET BY MOUTH TWICE DAILY. 90 tablet 3  . Multiple Vitamin (MULTIVITAMIN) capsule Take 1 capsule by mouth daily.    . nitroGLYCERIN (NITROSTAT) 0.4 MG SL tablet Place 1 tablet (0.4 mg total) under the tongue every 5 (five) minutes as needed. Chest pain 25 tablet 3  . omeprazole (PRILOSEC) 40 MG capsule Take 40 mg by mouth daily.    . simvastatin (ZOCOR) 40 MG tablet TAKE ONE TABLET BY MOUTH AT BEDTIME. 90 tablet 1   No current facility-administered medications for this visit.     Past Surgical History:  Procedure Laterality Date  . ABDOMINAL EXPLORATION SURGERY    . APPENDECTOMY    . BACK SURGERY     x 2  . bilateral knee arthroscopies     x 3  . CARPAL TUNNEL RELEASE Right   . COLONOSCOPY    . CORONARY ANGIOPLASTY     3 stents  .  DENTAL SURGERY    . ESOPHAGOGASTRODUODENOSCOPY    . HERNIA REPAIR     inguinal  . JOINT REPLACEMENT Left   . LEFT HEART CATHETERIZATION WITH CORONARY ANGIOGRAM N/A 08/27/2011   Procedure: LEFT HEART CATHETERIZATION WITH CORONARY ANGIOGRAM;  Surgeon: Hillary Bow, MD;  Location: [facility] CATH LAB;  Service: Cardiovascular;  Laterality: N/A;  . LUMBAR LAMINECTOMY WITH COFLEX 2 LEVEL N/A 01/03/2015   Procedure: Lumbar three-four,Lumbar four-five Laminectomy with Coflex;  Surgeon: Kristeen Miss, MD;  Location: [place] NEURO ORS;  Service: Neurosurgery;  Laterality: N/A;  . TOTAL KNEE ARTHROPLASTY Right 11/16/2013   DR Mayer Camel  . TOTAL KNEE ARTHROPLASTY Right 11/16/2013   Procedure: TOTAL KNEE ARTHROPLASTY;  Surgeon: Kerin Salen, MD;  Location: [place];  Service: Orthopedics;  Laterality: Right;  . TOTAL KNEE ARTHROPLASTY Left 01/12/02     Allergies  Allergen Reactions  . Codeine Nausea And Vomiting      Family History  Problem Relation Age of  Onset  . Heart attack Mother   . Diabetes Mother   . Emphysema Father   . Atrial fibrillation Sister   . Heart attack Brother   . Heart attack Brother   . Heart disease Brother   . Breast cancer Sister      Social History [name] [name] reports that he quit smoking about 21 years ago. He started smoking about 62 years ago. He has never used smokeless tobacco. [name] [name] reports current alcohol use.   Review of Systems CONSTITUTIONAL: No weight loss, fever, chills, weakness or fatigue.  HEENT: Eyes: No visual loss, blurred vision, double vision or yellow sclerae.No hearing loss, sneezing, congestion, runny nose or sore throat.  SKIN: No rash or itching.  CARDIOVASCULAR: per hpi RESPIRATORY: No shortness of breath, cough or sputum.  GASTROINTESTINAL: No anorexia, nausea, vomiting or diarrhea. No abdominal pain or blood.  GENITOURINARY: No burning on urination, no polyuria NEUROLOGICAL: No headache, dizziness, syncope, paralysis, ataxia, numbness or tingling in the extremities. No change in bowel or bladder control.  MUSCULOSKELETAL: No muscle, back pain, joint pain or stiffness.  LYMPHATICS: No enlarged nodes. No history of splenectomy.  PSYCHIATRIC: No history of depression or anxiety.  ENDOCRINOLOGIC: No reports of sweating, cold or heat intolerance. No polyuria or polydipsia.  Marland Kitchen   Physical Examination Today's Vitals   07/26/20 1511  BP: 122/64  Pulse: 82  SpO2: 94%  Weight: 204 lb (92.5 kg)  Height: 5\' 8"  (1.727 m)   Body mass index is 31.02 kg/m.  Gen: resting comfortably, no acute distress HEENT: no scleral icterus, pupils equal round and reactive, no palptable cervical adenopathy,  CV: RRR, no m/r/g, no jvd Resp: Clear to auscultation bilaterally GI: abdomen is soft, non-tender, non-distended, normal bowel sounds, no hepatosplenomegaly MSK: extremities are warm, no edema.  Skin: warm, no rash Neuro:  no focal deficits Psych: appropriate affect   Diagnostic  Studies 08/2003 Cath FINDINGS:  1. Left main trunk: Large caliber vessel with mild irregularities.  2. LAD: This begins as a large caliber vessel and tapers in the distal  section. Provides two diagonal branches. The proximal LAD has mild  disease of 30%. The mid LAD has eccentric plaque of 50%. The distal LAD  has a high grade narrowing of 95% medially after the second diagonal  Ashley Montminy with TIMI 2 flow distally. The first diagonal Lilas Diefendorf has an  ostial narrowing of 50-60%. The second diagonal Addysen Louth has an ostial  narrowing of 30-40% and is a large caliber vessel.  3. Left circumflex artery: This is a medium caliber vessel. Provides  trivial first marginal Donie Moulton in the mid section and two marginal  branches distally. The proximal AV circumflex has high grade narrowing  of 70-80%. There is then further narrowing of 50-60% after the first  marginal Kenyatta Keidel. The first marginal Tyrena Gohr has long tubular narrowing of  50% in the proximal segment. The second marginal Abbigael Detlefsen has an ostial  narrowing of 80%. The third marginal Karli Wickizer has proximal narrowing of  70%.  4. Right coronary artery: Dominant. This is a large caliber vessel.  Provides the posterior descending artery and posterior ventricular Alferd Obryant  in terminal segment. The right coronary artery has diffuse disease of 30-  40% in the mid and distal section.  5. LV: Normal end-systolic and end-diastolic dimensions. Overall left  ventricular function is well preserved. Ejection fraction greater than  55%. No mitral regurgitation. There is akinesis of a small portion of  the mid anterior wall. LV pressure is 110/10. Aortic is 110/60. LVEDP  equals 15.  DESCRIPTION OF PROCEDURE: With these findings we elected to proceed with  percutaneous intervention to the distal LAD. The patient was given heparin  systemically as well as Reopro to maintain an ACT of approximately 300  seconds using 600 mg of Plavix. During  the case he developed some itching  and was given Benadryl and Solu-Medrol with improvement of this. A 6-French  Q4 guide catheter was used to engage the left coronary artery and a 0.014  inch Asahi Prowater wire introduced. This was used to cross the stenosis  and position the distal LAD. A 2.5 x 15 mm Voyager balloon was introduced  and used to dilate the distal LAD lesion at 6 atmospheres for 120 seconds.  Repeat angiography showed an improvement in vessel lumen. However, there  was severe residual plaque and it was felt that further intervention would  be required. We introduced a 2.75 x 15 mm cutting balloon and a total of  six inflations were performed within the distal LAD at up to 10 atmospheres  for 60 seconds. Repeat angiography showed modest improvement in vessel  lumen. However, in the LAO cranial views there still appeared to be  residual narrowing of 70-80% at the origin of the stenosis. Some plaque  shifting had occurred into the diagonal Annalyn Blecher as well. While we tried to  avoid stent placement, this became unavoidable. We positioned a 2.5 x 18 mm  Cypher stent into the distal LAD and deployed this at 10 atmospheres for 30  seconds. A 2.75 x 15 mm Quantum Maverick balloon was then used to post  dilate the stent. Two inflations were performed up to 14 atmospheres for 30  seconds each. Repeat angiography was then performed after the  administration of intracoronary nitroglycerin showing excellent result with  no residual stenosis in the distal LAD and improvement of TIMI grade 2 to  TIMI grade 3 flow in the distal LAD. There was some plaque shift to the  origin of the second diagonal Mitsugi Schrader of 70-80%. However, there was still  TIMI 3 flow in this vessel. We decided to accept this result. Final  angiography was performed in various projections showing no distal vessel  damage. The guide catheter was then removed. An AngioSeal closure device  was then  deployed to the right femoral artery due to mild oozing around the  sheath. There was persistence of oozing and a FemStop was positioned until  hemostasis was achieved. The patient then  transferred to the CCU in stable  condition. He tolerated the procedure well.  FINAL RESULTS: Successful PTCA and stent placement in the distal LAD with  reduction of 95% narrowing to 0% with placement of a 2.5 x 18 mm Cypher drug-  eluting stent dilated to 2.75 mm and improvement of TIMI grade 2 to TIMI  grade 3 flow.   08/2011 Echo LVEF 76-72%, grade I diastolic dysfunction, difficult study.   09/04/13 EKG Normal sinus rhythm, no ischemic changes   Jan 2015 Stress MPI IMPRESSION: Low risk Lexiscan Cardiolite. No diagnostic ST segment changes were noted. Rare PACs observed. Perfusion imaging is consistent with diaphragmatic attenuation affecting the inferior wall, no clear evidence of scar or ischemia. LV volumes are normal and LVEF is 62% without wall motion abnormality.  09/25/14 Clinic EKG SR with PACs   06/2017 PFTs Mild ventilatory defect, +air trapping, moderately reduced DLCO    Assessment and Plan  1. CAD  -no symptoms, continue current meds - EKG today shows NSR, no acute ischemic changes.     2 . Hyperlipidemia - reports muscle aches on more potent statins -LDL remains reasonable given statin limitations, don't see strong indication to add zetia.  - continue current meds  3. HTN At goal, continue current meds     Arnoldo Lenis, M.D.

## 2020-07-27 NOTE — Addendum Note (Signed)
Addended by: Merlene Laughter on: 07/27/2020 01:29 PM   Modules accepted: Orders

## 2020-08-01 ENCOUNTER — Other Ambulatory Visit: Payer: Self-pay | Admitting: Cardiology

## 2020-08-10 ENCOUNTER — Telehealth: Payer: Self-pay | Admitting: Cardiology

## 2020-08-10 NOTE — Telephone Encounter (Signed)
Per wife, patient needs f/u since metoprolol was stopped due to low HR during admission at [facility] recently-d/c last night, and also aspirin stopped for 4 weeks due to bleed on brain. Advised to monitor BP and HR and appointment scheduled with Katina Dung NP 08/19/2020 @11 :30 am. Advised to continue medications per d/c instructions from [facility] for now. Verbalized understanding of plan.

## 2020-08-10 NOTE — Telephone Encounter (Signed)
New message     Patient had fall a week ago and had a brain bleed [facility] stopped his aspirin and his bp medication and said to contact Dr Harl Bowie for follow up , patient hr has been dropping into the 20's at night per wife.    Please call patient and advise how to proceed

## 2020-08-18 NOTE — Progress Notes (Signed)
Cardiology Office Note  Date: 08/19/2020   ID: [name] [name], DOB [birth year], MRN [id number]  PCP:  Leeanne Rio, MD  Cardiologist:  Carlyle Dolly, MD Electrophysiologist:  None   Chief Complaint: CAD, HTN, HLD  History of Present Illness: [name] [name] is a 74 y.o. male with a history of CAD( 2004 stents LAD and LCx), HTN, HLD.  Last encounter with Dr. Harl Bowie 07/26/2020.  Denied any recent chest pain, shortness of breath or DOE.  He was compliant with his medications.  He had been tried on multiple statins due to expereincing statin associated muscle symptoms.  He was tolerating simvastatin 40 mg daily.  Last LDL was 78.  He was compliant with this antihypertensive medications.  History of COPD, tobacco use.  Abnormal PFTs managed by PCP.  2019 no AAA  In the interim since last visit patient had a fall from a height of approximately 9 feet to the concrete floor from the loft in his barn and brief loss of consciousness.  He presented to [facility] on 08/05/2020 with primary complaint of nausea and pain in the back of his head, shoulders, and laceration to the right thumb and abrasion to left elbow.  He had a CT scan of his head showed evidence of the subdural hematoma on the right aspect of the falx measuring up to 4 mm in thickness with no mass-effect.  No parenchymal hemorrhage.  All other diagnostic imaging was negative for fracture.  CT of the abdomen showed no acute intrathoracic, abdominal or pelvic injury.  Evidence of aortic atherosclerosis and emphysema.  He was eventually transferred to [facility].  He states during his stay he was being monitored by a remote device on his wrist measuring his pulse.  He states his pulse was noted to be in the 20s by this device.  He was switched to telemetry and states there were never any issues with slow heart rate after telemetry was initiated.  His aspirin has been stopped in the interim since he has had the  subdural hemorrhage.  As a result of the slow pulse his metoprolol had been stopped.  Patient's wife states she has been measuring his blood pressures at home and she has a log of his blood pressures which appears to run anywhere from 024O to 973Z systolic with heart rates in the high 60s to mid 70s.  Patient states  he has been having some headaches since discharge from the hospital.  He states he was told by the discharge physicians at [facility] to follow-up with his cardiologist regarding restarting his metoprolol.  Past Medical History:  Diagnosis Date  . Anemia   . Arthritis   . Asthma    with exertion  . Back pain   . Cataracts, bilateral    immature  . Colitis    just finished one antibiotic and Cipro will be finished up  . COPD (chronic obstructive pulmonary disease) (HCC)    no meds required  . Coronary artery disease   . Diverticulosis   . GERD (gastroesophageal reflux disease)    takes Omeprazole daily  . Hemorrhoids   . History of bronchitis    many yrs ago  . History of colon polyps   . Hyperlipidemia    takes Simvastatin nightly  . Hypertension    takes Losartan and Metoprolol daily  . Joint pain   . Myocardial infarct ([place]) 2004  . Paget disease of bone  takes Fosamax daily  . Shortness of breath   . URI (upper respiratory infection)    Dec 31,2014  . Vertigo    takes Meclizine daily as needed    Past Surgical History:  Procedure Laterality Date  . ABDOMINAL EXPLORATION SURGERY    . APPENDECTOMY    . BACK SURGERY     x 2  . bilateral knee arthroscopies     x 3  . CARPAL TUNNEL RELEASE Right   . COLONOSCOPY    . CORONARY ANGIOPLASTY     3 stents  . DENTAL SURGERY    . ESOPHAGOGASTRODUODENOSCOPY    . HERNIA REPAIR     inguinal  . JOINT REPLACEMENT Left   . LEFT HEART CATHETERIZATION WITH CORONARY ANGIOGRAM N/A 08/27/2011   Procedure: LEFT HEART CATHETERIZATION WITH CORONARY ANGIOGRAM;  Surgeon: Hillary Bow, MD;   Location: [facility] CATH LAB;  Service: Cardiovascular;  Laterality: N/A;  . LUMBAR LAMINECTOMY WITH COFLEX 2 LEVEL N/A 01/03/2015   Procedure: Lumbar three-four,Lumbar four-five Laminectomy with Coflex;  Surgeon: Kristeen Miss, MD;  Location: [place] NEURO ORS;  Service: Neurosurgery;  Laterality: N/A;  . TOTAL KNEE ARTHROPLASTY Right 11/16/2013   DR Mayer Camel  . TOTAL KNEE ARTHROPLASTY Right 11/16/2013   Procedure: TOTAL KNEE ARTHROPLASTY;  Surgeon: Kerin Salen, MD;  Location: [place];  Service: Orthopedics;  Laterality: Right;  . TOTAL KNEE ARTHROPLASTY Left 01/12/02    Current Outpatient Medications  Medication Sig Dispense Refill  . albuterol (PROVENTIL HFA;VENTOLIN HFA) 108 (90 Base) MCG/ACT inhaler Inhale 2 puffs into the lungs as needed for wheezing or shortness of breath.    . cetirizine (ZYRTEC) 10 MG tablet Take 10 mg by mouth daily.    . cholecalciferol (VITAMIN D) 1000 UNITS tablet Take 2,000 Units by mouth daily.    Marland Kitchen losartan (COZAAR) 50 MG tablet TAKE ONE TABLET BY MOUTH EVERY DAY 90 tablet 3  . meclizine (ANTIVERT) 25 MG tablet Take 25 mg by mouth 3 (three) times daily.    . Multiple Vitamin (MULTIVITAMIN) capsule Take 1 capsule by mouth daily.    . nitroGLYCERIN (NITROSTAT) 0.4 MG SL tablet Place 1 tablet (0.4 mg total) under the tongue every 5 (five) minutes as needed. Chest pain 25 tablet 3  . omeprazole (PRILOSEC) 40 MG capsule Take 40 mg by mouth daily.    . simvastatin (ZOCOR) 40 MG tablet TAKE ONE TABLET BY MOUTH AT BEDTIME 90 tablet 1  . metoprolol tartrate (LOPRESSOR) 25 MG tablet Take 0.5 tablets (12.5 mg total) by mouth 2 (two) times daily. 90 tablet 1   No current facility-administered medications for this visit.   Allergies:  Codeine   Social History: The patient  reports that he quit smoking about 21 years ago. He started smoking about 62 years ago. He has never used smokeless tobacco. He reports current alcohol use. He reports that he does not use drugs.   Family History: The  patient's family history includes Atrial fibrillation in his sister; Breast cancer in his sister; Diabetes in his mother; Emphysema in his father; Heart attack in his brother, brother, and mother; Heart disease in his brother.   ROS:  Please see the history of present illness. Otherwise, complete review of systems is positive for none.  All other systems are reviewed and negative.   Physical Exam: VS:  BP 110/62   Pulse 68   Ht 5\' 8"  (1.727 m)   Wt 206 lb (93.4 kg)   SpO2 93%   BMI  31.32 kg/m , BMI Body mass index is 31.32 kg/m.  Wt Readings from Last 3 Encounters:  08/19/20 206 lb (93.4 kg)  07/26/20 204 lb (92.5 kg)  07/23/19 206 lb 6.4 oz (93.6 kg)    General: Patient appears comfortable at rest. Neck: Supple, no elevated JVP or carotid bruits, no thyromegaly. Lungs: Clear to auscultation, nonlabored breathing at rest. Cardiac: Regular rate and rhythm, no S3 or significant systolic murmur, no pericardial rub. Extremities: No pitting edema, distal pulses 2+. Skin: Warm and dry. Musculoskeletal: No kyphosis. Neuropsychiatric: Alert and oriented x3, affect grossly appropriate.  ECG:  07/26/2020 EKG normal sinus rhythm rate of 78.  Recent Labwork: No results found for requested labs within last 8760 hours.     Component Value Date/Time   CHOL 127 08/28/2011 0505   TRIG 189 (H) 08/28/2011 0505   HDL 31 (L) 08/28/2011 0505   CHOLHDL 4.1 08/28/2011 0505   VLDL 38 08/28/2011 0505   LDLCALC 58 08/28/2011 0505    Other Studies Reviewed Today:  08/2003 Cath FINDINGS:  1. Left main trunk: Large caliber vessel with mild irregularities.  2. LAD: This begins as a large caliber vessel and tapers in the distal  section. Provides two diagonal branches. The proximal LAD has mild  disease of 30%. The mid LAD has eccentric plaque of 50%. The distal LAD  has a high grade narrowing of 95% medially after the second diagonal  branch with TIMI 2 flow distally. The first diagonal  branch has an  ostial narrowing of 50-60%. The second diagonal branch has an ostial  narrowing of 30-40% and is a large caliber vessel.  3. Left circumflex artery: This is a medium caliber vessel. Provides  trivial first marginal branch in the mid section and two marginal  branches distally. The proximal AV circumflex has high grade narrowing  of 70-80%. There is then further narrowing of 50-60% after the first  marginal branch. The first marginal branch has long tubular narrowing of  50% in the proximal segment. The second marginal branch has an ostial  narrowing of 80%. The third marginal branch has proximal narrowing of  70%.  4. Right coronary artery: Dominant. This is a large caliber vessel.  Provides the posterior descending artery and posterior ventricular branch  in terminal segment. The right coronary artery has diffuse disease of 30-  40% in the mid and distal section.  5. LV: Normal end-systolic and end-diastolic dimensions. Overall left  ventricular function is well preserved. Ejection fraction greater than  55%. No mitral regurgitation. There is akinesis of a small portion of  the mid anterior wall. LV pressure is 110/10. Aortic is 110/60. LVEDP  equals 15.  DESCRIPTION OF PROCEDURE: With these findings we elected to proceed with  percutaneous intervention to the distal LAD. The patient was given heparin  systemically as well as Reopro to maintain an ACT of approximately 300  seconds using 600 mg of Plavix. During the case he developed some itching  and was given Benadryl and Solu-Medrol with improvement of this. A 6-French  Q4 guide catheter was used to engage the left coronary artery and a 0.014  inch Asahi Prowater wire introduced. This was used to cross the stenosis  and position the distal LAD. A 2.5 x 15 mm Voyager balloon was introduced  and used to dilate the distal LAD lesion at 6 atmospheres for 120 seconds.  Repeat angiography showed  an improvement in vessel lumen. However, there  was severe residual plaque and  it was felt that further intervention would  be required. We introduced a 2.75 x 15 mm cutting balloon and a total of  six inflations were performed within the distal LAD at up to 10 atmospheres  for 60 seconds. Repeat angiography showed modest improvement in vessel  lumen. However, in the LAO cranial views there still appeared to be  residual narrowing of 70-80% at the origin of the stenosis. Some plaque  shifting had occurred into the diagonal branch as well. While we tried to  avoid stent placement, this became unavoidable. We positioned a 2.5 x 18 mm  Cypher stent into the distal LAD and deployed this at 10 atmospheres for 30  seconds. A 2.75 x 15 mm Quantum Maverick balloon was then used to post  dilate the stent. Two inflations were performed up to 14 atmospheres for 30  seconds each. Repeat angiography was then performed after the  administration of intracoronary nitroglycerin showing excellent result with  no residual stenosis in the distal LAD and improvement of TIMI grade 2 to  TIMI grade 3 flow in the distal LAD. There was some plaque shift to the  origin of the second diagonal branch of 70-80%. However, there was still  TIMI 3 flow in this vessel. We decided to accept this result. Final  angiography was performed in various projections showing no distal vessel  damage. The guide catheter was then removed. An AngioSeal closure device  was then deployed to the right femoral artery due to mild oozing around the  sheath. There was persistence of oozing and a FemStop was positioned until  hemostasis was achieved. The patient then transferred to the CCU in stable  condition. He tolerated the procedure well.  FINAL RESULTS: Successful PTCA and stent placement in the distal LAD with  reduction of 95% narrowing to 0% with placement of a 2.5 x 18 mm Cypher drug-  eluting stent dilated  to 2.75 mm and improvement of TIMI grade 2 to TIMI  grade 3 flow.   08/2011 Echo LVEF 23-53%, grade I diastolic dysfunction, difficult study.   09/04/13 EKG Normal sinus rhythm, no ischemic changes   Jan 2015 Stress MPI IMPRESSION: Low risk Lexiscan Cardiolite. No diagnostic ST segment changes were noted. Rare PACs observed. Perfusion imaging is consistent with diaphragmatic attenuation affecting the inferior wall, no clear evidence of scar or ischemia. LV volumes are normal and LVEF is 62% without wall motion abnormality.  09/25/14 Clinic EKG SR with PACs   06/2017 PFTs Mild ventilatory defect, +air trapping, moderately reduced DLCO  Assessment and Plan:  1. CAD in native artery   2. Mixed hyperlipidemia   3. Essential hypertension    1. CAD in native artery Denies any anginal or exertional symptoms.  States she had some left-sided chest discomfort which appeared to be associated with using a nail gun recently.  He has been fixing up his barn and performing fairly labor-intensive activity.  States he has had no more chest pain since that occurred.  Continue nitroglycerin 0.4 as needed.  Aspirin has been stopped due to recent fall and subdural hemorrhage.  Patient has a follow-up with neurology at [facility] soon.  He may restart at neurologist discretion at previous dose.  2. Mixed hyperlipidemia Continue simvastatin 40 mg p.o. daily.  Last lipid panel at [facility] 08/24/2019 showed total cholesterol 139, triglycerides 130, HDL 38, LDL 78.  3. Essential hypertension Patient states during his recent visit to [facility] for recent fall and  subdural hematoma his metoprolol was stopped secondary to abnormally slow pulse on a remote device monitoring system.  He states after this was noted on the device he was switched to the telemetry and had no more episodes of slow heart rate.  His wife brings a log of blood pressures recently which showed  appeared to be running in the 406E to 403V systolic.  Heart rate has been running in the 70s to 80s.  Restart metoprolol 12.5 mg p.o. twice daily.  Medication Adjustments/Labs and Tests Ordered: Current medicines are reviewed at length with the patient today.  Concerns regarding medicines are outlined above.   Disposition: Follow-up with Dr. Harl Bowie or APP 1 year.  Signed, Levell July, NP 08/19/2020 12:36 PM    [facility] at [place], [place], [place] [address] Phone: [phone]; Fax: [phone]

## 2020-08-19 ENCOUNTER — Ambulatory Visit: Payer: Medicare Other | Admitting: Family Medicine

## 2020-08-19 ENCOUNTER — Encounter: Payer: Self-pay | Admitting: Family Medicine

## 2020-08-19 VITALS — BP 110/62 | HR 68 | Ht 68.0 in | Wt 206.0 lb

## 2020-08-19 DIAGNOSIS — E782 Mixed hyperlipidemia: Secondary | ICD-10-CM

## 2020-08-19 DIAGNOSIS — I10 Essential (primary) hypertension: Secondary | ICD-10-CM

## 2020-08-19 DIAGNOSIS — I2510 Atherosclerotic heart disease of native coronary artery without angina pectoris: Secondary | ICD-10-CM

## 2020-08-19 MED ORDER — METOPROLOL TARTRATE 25 MG PO TABS: 12.5000 mg | ORAL_TABLET | Freq: Two times a day (BID) | ORAL | 1 refills | Status: DC

## 2020-08-19 NOTE — Patient Instructions (Signed)
Your physician wants you to follow-up in: [place] will receive a reminder letter in the mail two months in advance. If you don't receive a letter, please call our office to schedule the follow-up appointment.  Your physician has recommended you make the following change in your medication:   RE START LOPRESSOR 12.5 MG TWICE DAILY   Thank you for choosing [facility]!!

## 2020-09-22 ENCOUNTER — Telehealth: Payer: Self-pay | Admitting: Cardiology

## 2020-09-22 NOTE — Telephone Encounter (Signed)
Pam-wife called stating that patient went back to [facility] a week ago for a brain scan. He was released from Neurology Patient needs to know if he is supposed to go back on ASA.  [phone].

## 2020-09-23 NOTE — Telephone Encounter (Signed)
Restart aspirin 81mg  daily  Zandra Abts MD

## 2020-09-23 NOTE — Telephone Encounter (Signed)
Contacted patient regarding his Aspirin regimen. He verbalized understanding to Dr. Devra Dopp' orders to restart his 81 mg aspirin daily.

## 2020-11-01 ENCOUNTER — Encounter: Payer: Self-pay | Admitting: Family Medicine

## 2020-11-01 ENCOUNTER — Ambulatory Visit: Payer: Medicare Other | Admitting: Family Medicine

## 2020-11-01 VITALS — BP 138/80 | HR 83 | Ht 70.0 in | Wt 212.2 lb

## 2020-11-01 DIAGNOSIS — E782 Mixed hyperlipidemia: Secondary | ICD-10-CM

## 2020-11-01 DIAGNOSIS — I10 Essential (primary) hypertension: Secondary | ICD-10-CM | POA: Diagnosis not present

## 2020-11-01 DIAGNOSIS — I2510 Atherosclerotic heart disease of native coronary artery without angina pectoris: Secondary | ICD-10-CM | POA: Diagnosis not present

## 2020-11-01 DIAGNOSIS — I63532 Cerebral infarction due to unspecified occlusion or stenosis of left posterior cerebral artery: Secondary | ICD-10-CM | POA: Diagnosis not present

## 2020-11-01 NOTE — Progress Notes (Signed)
Cardiology Office Note  Date: 11/01/2020   ID: CANDACE RAMUS, DOB 11/21/1945, MRN [id number]  PCP:  Leeanne Rio, MD  Cardiologist:  Carlyle Dolly, MD Electrophysiologist:  None   Chief Complaint: CAD, HTN, HLD  History of Present Illness: [name] [name] is a 75 y.o. male with a history of CAD( 2004 stents LAD and LCx), HTN, HLD.  Last encounter with Dr. Harl Bowie 07/26/2020.  Denied any recent chest pain, shortness of breath or DOE.  He was compliant with his medications.  He had been tried on multiple statins due to expereincing statin associated muscle symptoms.  He was tolerating simvastatin 40 mg daily.  Last LDL was 78.  He was compliant with this antihypertensive medications.  History of COPD, tobacco use.  Abnormal PFTs managed by PCP.  2019 no AAA  In the interim since last visit patient had a fall from a height of approximately 9 feet to the concrete floor from the loft in his barn and brief loss of consciousness.  He presented to [facility] on 08/05/2020 with primary complaint of nausea and pain in the back of his head, shoulders, and laceration to the right thumb and abrasion to left elbow.  He had a CT scan of his head showed evidence of the subdural hematoma on the right aspect of the falx measuring up to 4 mm in thickness with no mass-effect.  No parenchymal hemorrhage.  All other diagnostic imaging was negative for fracture.  CT of the abdomen showed no acute intrathoracic, abdominal or pelvic injury.  Evidence of aortic atherosclerosis and emphysema.  He was eventually transferred to [facility].  He states during his stay he was being monitored by a remote device on his wrist measuring his pulse.  He states his pulse was noted to be in the 20s by this device.  He was switched to telemetry and states there were never any issues with slow heart rate after telemetry was initiated.  His aspirin has been stopped in the interim since he has had the  subdural hemorrhage.  As a result of the slow pulse his metoprolol had been stopped.  Patient's wife states she has been measuring his blood pressures at home and she has a log of his blood pressures which appears to run anywhere from 784O to 962X systolic with heart rates in the high 60s to mid 70s.  Patient states  he has been having some headaches since discharge from the hospital.  He states he was told by the discharge physicians at [facility] to follow-up with his cardiologist regarding restarting his metoprolol.  Recent presentation to [facility] on 10/28/2020 for occipital stroke.  He had presented via EMS with sudden onset of right facial field defect followed by numbness of the hands and feet.  There was no demonstration of dysarthria, facial droop.  His symptoms completely resolved several hours after arrival to the emergency room.  CT of the head and neck showed severe stenosis of PCA with no flow distal to it.  He was on daily aspirin, Plavix was added and statin was continued.  Blood pressure medication were held during hospital stay to allow for permissive hypertension.  He was advised to start metoprolol and resume losartan in a day or 2 if blood pressure was elevated.  His subdural hematoma had resolved per CT of the head.  He was discharged yesterday 10/31/2020.  He presents for follow-up after recent CVA.  He  and his wife have questions about medications.  His losartan and metoprolol were stopped during recent hospital visit secondary to recent CVA.  His wife states his blood pressure has been up since discharge.  She stated systolic blood pressure was 160 this morning.  Today on arrival blood pressure is 138/80.  Patient had some issues with vertigo-like symptoms since his fall and subdural hematoma.  He has been working with PT for his back and hip injury as well as to help with vertigo symptoms.  He has as needed meclizine at home.  He states the meclizine seems  not to work or improve symptoms but will try it again to see if it helps.  He had questions about Plavix and aspirin.  He and his wife were concerned about restarting the losartan and Lopressor.  Past Medical History:  Diagnosis Date  . Anemia   . Arthritis   . Asthma    with exertion  . Back pain   . Cataracts, bilateral    immature  . Colitis    just finished one antibiotic and Cipro will be finished up  . COPD (chronic obstructive pulmonary disease) (HCC)    no meds required  . Coronary artery disease   . Diverticulosis   . GERD (gastroesophageal reflux disease)    takes Omeprazole daily  . Hemorrhoids   . History of bronchitis    many yrs ago  . History of colon polyps   . Hyperlipidemia    takes Simvastatin nightly  . Hypertension    takes Losartan and Metoprolol daily  . Joint pain   . Myocardial infarct ([place]) 2004  . Paget disease of bone    takes Fosamax daily  . Shortness of breath   . URI (upper respiratory infection)    Dec 31,2014  . Vertigo    takes Meclizine daily as needed    Past Surgical History:  Procedure Laterality Date  . ABDOMINAL EXPLORATION SURGERY    . APPENDECTOMY    . BACK SURGERY     x 2  . bilateral knee arthroscopies     x 3  . CARPAL TUNNEL RELEASE Right   . COLONOSCOPY    . CORONARY ANGIOPLASTY     3 stents  . DENTAL SURGERY    . ESOPHAGOGASTRODUODENOSCOPY    . HERNIA REPAIR     inguinal  . JOINT REPLACEMENT Left   . LEFT HEART CATHETERIZATION WITH CORONARY ANGIOGRAM N/A 08/27/2011   Procedure: LEFT HEART CATHETERIZATION WITH CORONARY ANGIOGRAM;  Surgeon: Hillary Bow, MD;  Location: [facility] CATH LAB;  Service: Cardiovascular;  Laterality: N/A;  . LUMBAR LAMINECTOMY WITH COFLEX 2 LEVEL N/A 01/03/2015   Procedure: Lumbar three-four,Lumbar four-five Laminectomy with Coflex;  Surgeon: Kristeen Miss, MD;  Location: [facility]O ORS;  Service: Neurosurgery;  Laterality: N/A;  . TOTAL KNEE ARTHROPLASTY Right 11/16/2013   DR Mayer Camel  .  TOTAL KNEE ARTHROPLASTY Right 11/16/2013   Procedure: TOTAL KNEE ARTHROPLASTY;  Surgeon: Kerin Salen, MD;  Location: [facility];  Service: Orthopedics;  Laterality: Right;  . TOTAL KNEE ARTHROPLASTY Left 01/12/02    Current Outpatient Medications  Medication Sig Dispense Refill  . albuterol (PROVENTIL HFA;VENTOLIN HFA) 108 (90 Base) MCG/ACT inhaler Inhale 2 puffs into the lungs as needed for wheezing or shortness of breath.    Marland Kitchen aspirin 81 MG EC tablet Take 1 tablet by mouth daily.    . cetirizine (ZYRTEC) 10 MG tablet Take 10 mg by mouth daily.    Marland Kitchen  cholecalciferol (VITAMIN D) 1000 UNITS tablet Take 2,000 Units by mouth daily.    . clopidogrel (PLAVIX) 75 MG tablet Take 75 mg by mouth daily.    Marland Kitchen losartan (COZAAR) 50 MG tablet TAKE ONE TABLET BY MOUTH EVERY DAY 90 tablet 3  . meclizine (ANTIVERT) 25 MG tablet Take 25 mg by mouth 3 (three) times daily.    . metoprolol tartrate (LOPRESSOR) 25 MG tablet Take 0.5 tablets (12.5 mg total) by mouth 2 (two) times daily. 90 tablet 1  . Multiple Vitamin (MULTIVITAMIN) capsule Take 1 capsule by mouth daily.    . nitroGLYCERIN (NITROSTAT) 0.4 MG SL tablet Place 1 tablet (0.4 mg total) under the tongue every 5 (five) minutes as needed. Chest pain 25 tablet 3  . omeprazole (PRILOSEC) 40 MG capsule Take 40 mg by mouth daily.    . simvastatin (ZOCOR) 40 MG tablet TAKE ONE TABLET BY MOUTH AT BEDTIME 90 tablet 1   No current facility-administered medications for this visit.   Allergies:  Codeine   Social History: The patient  reports that he quit smoking about 22 years ago. He started smoking about 62 years ago. He has never used smokeless tobacco. He reports current alcohol use. He reports that he does not use drugs.   Family History: The patient's family history includes Atrial fibrillation in his sister; Breast cancer in his sister; Diabetes in his mother; Emphysema in his father; Heart attack in his brother, brother, and mother; Heart disease in his brother.    ROS:  Please see the history of present illness. Otherwise, complete review of systems is positive for none.  All other systems are reviewed and negative.   Physical Exam: VS:  BP 138/80   Pulse 83   Ht 5\' 10"  (1.778 m)   Wt 212 lb 3.2 oz (96.3 kg)   SpO2 94%   BMI 30.45 kg/m , BMI Body mass index is 30.45 kg/m.  Wt Readings from Last 3 Encounters:  11/01/20 212 lb 3.2 oz (96.3 kg)  08/19/20 206 lb (93.4 kg)  07/26/20 204 lb (92.5 kg)    General: Patient appears comfortable at rest. Neck: Supple, no elevated JVP or carotid bruits, no thyromegaly. Lungs: Clear to auscultation, nonlabored breathing at rest. Cardiac: Regular rate and rhythm, no S3 or significant systolic murmur, no pericardial rub. Extremities: No pitting edema, distal pulses 2+. Skin: Warm and dry. Musculoskeletal: No kyphosis. Neuropsychiatric: Alert and oriented x3, affect grossly appropriate.  ECG:  07/26/2020 EKG normal sinus rhythm rate of 78.  Recent Labwork: No results found for requested labs within last 8760 hours.     Component Value Date/Time   CHOL 127 08/28/2011 0505   TRIG 189 (H) 08/28/2011 0505   HDL 31 (L) 08/28/2011 0505   CHOLHDL 4.1 08/28/2011 0505   VLDL 38 08/28/2011 0505   LDLCALC 58 08/28/2011 0505    Other Studies Reviewed Today:  CT head without contrast [facility] 10/28/2020 Impression  1. Resolution of previously seen para falcine subdural hematoma.  2. Acute left occipital cortical and subcortical infarction.  3. Chronic small-vessel ischemic changes of the cerebral hemispheric  white matter.  4. These results were called by telephoneat the time of  interpretation on 10/28/2020 at 10:23 pm to provider [facility] ,  who verbally acknowledged these results.    CT ANGIOGRAPHY HEAD AND NECK 10/28/2020 [facility] IMPRESSION: 1. Atherosclerotic disease at both carotid bifurcations. 20% or less stenosis of the ICA bulb on the right. No stenosis on  the left. 2.  Severe focal stenosis of the left posterior cerebral artery at the P1 P2 junction. The vessel does show flow distal to that however. 3. No other intracranial large or medium vessel occlusion or correctable proximal stenosis. 4. Emphysema and aortic atherosclerosis.   MRI head 10/31/2020 [facility] MRI HEAD WITHOUT CONTRAST IMPRESSION: 1. Small acute infarcts in the left occipital cortex. 2. Chronic small vessel ischemia in the cerebral white matter, mild for age.   Echocardiogram 10/31/2020 [facility] healthcare Summary  1. Technically difficult study.  2. The left ventricle is normal in size with mildly increased wall  thickness.  3. The left ventricular systolic function is normal, LVEF is visually  estimated at 65-70%.  4. There is grade I diastolic dysfunction (impaired relaxation).  5. The aortic valve is trileaflet with mildly thickened leaflets with normal  excursion.  6. The left atrium is mildly dilated in size.  7. The right ventricle is upper normal in size, with normal systolic  function.      CT head 09/06/2020 [facility] medical center FINDINGS:   . Calvarium/skull base: No evidence of acute fracture or destructive lesion. Mastoids and middle ears demonstrate no substantial mucosal disease.   . Paranasalsinuses: Mild scattered inflammatory mucosal thickening. Note is made of multiple dental implants.   . Brain: Previous subdural hemorrhage seen layering along the right posterior falx has essentially resolved. There is minimal irregular thickening along the right posterior falx that could reflect trace blood products versus reactive dural thickening. No evidence for acute large vessel territory infarction. Unchanged patchy region of hypodensity in the right frontal subcortical white matter likely reflecting sequela prior ischemia. Similar background of presumed chronic small vessel ischemic changes and intracranial atherosclerosis. No mass lesion. No mass  effect. No hydrocephalus. No acute hemorrhage.    08/2003 Cath FINDINGS:  1. Left main trunk: Large caliber vessel with mild irregularities.  2. LAD: This begins as a large caliber vessel and tapers in the distal  section. Provides two diagonal branches. The proximal LAD has mild  disease of 30%. The mid LAD has eccentric plaque of 50%. The distal LAD  has a high grade narrowing of 95% medially after the second diagonal  branch with TIMI 2 flow distally. The first diagonal branch has an  ostial narrowing of 50-60%. The second diagonal branch has an ostial  narrowing of 30-40% and is a large caliber vessel.  3. Left circumflex artery: This is a medium caliber vessel. Provides  trivial first marginal branch in the mid section and two marginal  branches distally. The proximal AV circumflex has high grade narrowing  of 70-80%. There is then further narrowing of 50-60% after the first  marginal branch. The first marginal branch has long tubular narrowing of  50% in the proximal segment. The second marginal branch has an ostial  narrowing of 80%. The third marginal branch has proximal narrowing of  70%.  4. Right coronary artery: Dominant. This is a large caliber vessel.  Provides the posterior descending artery and posterior ventricular branch  in terminal segment. The right coronary artery has diffuse disease of 30-  40% in the mid and distal section.  5. LV: Normal end-systolic and end-diastolic dimensions. Overall left  ventricular function is well preserved. Ejection fraction greater than  55%. No mitral regurgitation. There is akinesis of a small portion of  the mid anterior wall. LV pressure is 110/10. Aortic is 110/60. LVEDP  equals 15.  DESCRIPTION OF PROCEDURE: With these  findings we elected to proceed with  percutaneous intervention to the distal LAD. The patient was given heparin  systemically as well as Reopro to maintain an ACT of approximately  300  seconds using 600 mg of Plavix. During the case he developed some itching  and was given Benadryl and Solu-Medrol with improvement of this. A 6-French  Q4 guide catheter was used to engage the left coronary artery and a 0.014  inch Asahi Prowater wire introduced. This was used to cross the stenosis  and position the distal LAD. A 2.5 x 15 mm Voyager balloon was introduced  and used to dilate the distal LAD lesion at 6 atmospheres for 120 seconds.  Repeat angiography showed an improvement in vessel lumen. However, there  was severe residual plaque and it was felt that further intervention would  be required. We introduced a 2.75 x 15 mm cutting balloon and a total of  six inflations were performed within the distal LAD at up to 10 atmospheres  for 60 seconds. Repeat angiography showed modest improvement in vessel  lumen. However, in the LAO cranial views there still appeared to be  residual narrowing of 70-80% at the origin of the stenosis. Some plaque  shifting had occurred into the diagonal branch as well. While we tried to  avoid stent placement, this became unavoidable. We positioned a 2.5 x 18 mm  Cypher stent into the distal LAD and deployed this at 10 atmospheres for 30  seconds. A 2.75 x 15 mm Quantum Maverick balloon was then used to post  dilate the stent. Two inflations were performed up to 14 atmospheres for 30  seconds each. Repeat angiography was then performed after the  administration of intracoronary nitroglycerin showing excellent result with  no residual stenosis in the distal LAD and improvement of TIMI grade 2 to  TIMI grade 3 flow in the distal LAD. There was some plaque shift to the  origin of the second diagonal branch of 70-80%. However, there was still  TIMI 3 flow in this vessel. We decided to accept this result. Final  angiography was performed in various projections showing no distal vessel  damage. The guide catheter was then  removed. An AngioSeal closure device  was then deployed to the right femoral artery due to mild oozing around the  sheath. There was persistence of oozing and a FemStop was positioned until  hemostasis was achieved. The patient then transferred to the CCU in stable  condition. He tolerated the procedure well.  FINAL RESULTS: Successful PTCA and stent placement in the distal LAD with  reduction of 95% narrowing to 0% with placement of a 2.5 x 18 mm Cypher drug-  eluting stent dilated to 2.75 mm and improvement of TIMI grade 2 to TIMI  grade 3 flow.   08/2011 Echo LVEF 96-75%, grade I diastolic dysfunction, difficult study.   09/04/13 EKG Normal sinus rhythm, no ischemic changes   Jan 2015 Stress MPI IMPRESSION: Low risk Lexiscan Cardiolite. No diagnostic ST segment changes were noted. Rare PACs observed. Perfusion imaging is consistent with diaphragmatic attenuation affecting the inferior wall, no clear evidence of scar or ischemia. LV volumes are normal and LVEF is 62% without wall motion abnormality.  09/25/14 Clinic EKG SR with PACs   06/2017 PFTs Mild ventilatory defect, +air trapping, moderately reduced DLCO  Assessment and Plan:   1. CAD in native artery Denies any recent anginal or exertional symptoms.  Aspirin started at 81 mg daily and Plavix was added added  recently for CVA with recent admission to [facility].  Continue sublingual nitroglycerin as needed.  2. Mixed hyperlipidemia Continue simvastatin 40 mg p.o. daily.  Recent lipid panel 10/29/2020 demonstrated TG 108, TC 123, HDL 35, LDL 66.   3. Essential hypertension Recent CVA at [facility].  His losartan and metoprolol were temporarily held and to be restarted 2 days after discharge per hospitalist.  He is discharged on 10/31/2020.  His wife states his blood pressure systolic was 638 this morning.  She was concerned that he needed to restart his blood pressure medications.   Please restart metoprolol 12.5 mg p.o. twice daily.  Advised patient if he starts having worsening vertigo after restarting metoprolol to to stop the medication.  Follow-up in 1 week after restarting metoprolol and bring a log of blood pressures and/or blood pressure machine.  We will decide at that time if losartan needs to be added back.  4.  CVA  Had a recent CVA with vision loss and right visual field.  He was diagnosed with occipital CVA and admitted to [facility].  He was started on Plavix 75 mg daily and aspirin 81 mg daily.  MRI demonstrated small acute infarcts in the left occipital cortex. Chronic small vessel ischemia in the cerebral white matter, mild for age. CT scan showed resolution of previous subdural hematoma.  Continue Plavix 75 mg, aspirin 81 mg.  Medication Adjustments/Labs and Tests Ordered: Current medicines are reviewed at length with the patient today.  Concerns regarding medicines are outlined above.   Disposition: Follow-up with Dr. Harl Bowie or APP 1 month  Signed, Levell July, NP 11/01/2020 4:59 PM    [facility] [place], [place], [place] [address] Phone: [phone]; Fax: [phone]

## 2020-11-01 NOTE — Patient Instructions (Signed)
Medication Instructions:   Resume the Lopressor 12.5mg  twice a day.  Continue all other medications.    Labwork: none  Testing/Procedures: none  Follow-Up: 1 month  Any Other Special Instructions Will Be Listed Below (If Applicable). Nurse BP check - due in 1 week.  If you need a refill on your cardiac medications before your next appointment, please call your pharmacy.

## 2020-11-08 ENCOUNTER — Ambulatory Visit (INDEPENDENT_AMBULATORY_CARE_PROVIDER_SITE_OTHER): Payer: Medicare Other | Admitting: *Deleted

## 2020-11-08 ENCOUNTER — Encounter: Payer: Self-pay | Admitting: *Deleted

## 2020-11-08 VITALS — BP 144/76 | HR 64 | Ht 70.0 in | Wt 212.6 lb

## 2020-11-08 DIAGNOSIS — I10 Essential (primary) hypertension: Secondary | ICD-10-CM

## 2020-11-08 NOTE — Progress Notes (Signed)
Presents for nurse BP check per last office visit per Katina Dung NP--Follow-up in 1 week after restarting metoprolol and bring a log of blood pressures and/or blood pressure machine.  We will decide at that time if losartan needs to be added back.  Reports taking all medications as prescribed except losartan. Continues to hold losartan. Denies side effects from medications. Denies chest pain, sob. Reports having dizziness for awhile that has improved. Medications reviewed and vitals taken. Brought home BP readings for review.  Home BP machine today 149/91 HR 69

## 2020-11-09 ENCOUNTER — Other Ambulatory Visit: Payer: Self-pay | Admitting: Orthopedic Surgery

## 2020-11-09 DIAGNOSIS — M7060 Trochanteric bursitis, unspecified hip: Secondary | ICD-10-CM

## 2020-11-09 DIAGNOSIS — M25552 Pain in left hip: Secondary | ICD-10-CM

## 2020-11-10 ENCOUNTER — Other Ambulatory Visit: Payer: Self-pay

## 2020-11-10 ENCOUNTER — Ambulatory Visit
Admission: RE | Admit: 2020-11-10 | Discharge: 2020-11-10 | Disposition: A | Discharge status: Home / Self Care | Payer: Medicare Other | Source: Ambulatory Visit | Attending: Orthopedic Surgery | Admitting: Orthopedic Surgery

## 2020-11-10 DIAGNOSIS — M25552 Pain in left hip: Secondary | ICD-10-CM

## 2020-11-10 DIAGNOSIS — M7060 Trochanteric bursitis, unspecified hip: Secondary | ICD-10-CM

## 2020-11-16 ENCOUNTER — Telehealth: Payer: Self-pay | Admitting: Cardiology

## 2020-11-16 NOTE — Telephone Encounter (Signed)
Pt BP 165/100 this morning per LOV and recent nurse appt on 2/22 pt re started Lopressor 12.5 mg bid and held losartan - pt will re start losartan 50 mg daily and continue to monitor BP - denies any other symptoms at this time - says he is somewhat dizzy when he get up in the morning but says this doesn't last long and isn't bothersome - will forward to provider

## 2020-11-16 NOTE — Telephone Encounter (Signed)
Patient called again to see what Dr. Harl Bowie wants him to do with his high BP readings. He said this morning it was 165/100 he is concerned that he may have a stroke.He needs to know if he should start back on his losartan (COZAAR) 50 MG tablet. Please call him asap [phone]

## 2020-11-28 ENCOUNTER — Other Ambulatory Visit: Payer: Self-pay | Admitting: *Deleted

## 2020-11-28 ENCOUNTER — Other Ambulatory Visit (HOSPITAL_COMMUNITY)
Admission: RE | Admit: 2020-11-28 | Discharge: 2020-11-28 | Disposition: A | Discharge status: Home / Self Care | Payer: Medicare Other | Source: Ambulatory Visit | Attending: Family Medicine | Admitting: Family Medicine

## 2020-11-28 ENCOUNTER — Other Ambulatory Visit: Payer: Self-pay

## 2020-11-28 DIAGNOSIS — I10 Essential (primary) hypertension: Secondary | ICD-10-CM | POA: Insufficient documentation

## 2020-11-28 DIAGNOSIS — Z79899 Other long term (current) drug therapy: Secondary | ICD-10-CM | POA: Insufficient documentation

## 2020-11-28 LAB — BASIC METABOLIC PANEL
Anion gap: 11 (ref 5–15)
BUN: 16 mg/dL (ref 8–23)
CO2: 22 mmol/L (ref 22–32)
Calcium: 9.2 mg/dL (ref 8.9–10.3)
Chloride: 103 mmol/L (ref 98–111)
Creatinine, Ser: 1.03 mg/dL (ref 0.61–1.24)
GFR, Estimated: >60 mL/min (ref >60)
Glucose, Bld: 174 mg/dL — ABNORMAL HIGH (ref 70–99)
Potassium: 3.9 mmol/L (ref 3.5–5.1)
Sodium: 136 mmol/L (ref 135–145)

## 2020-11-28 NOTE — Progress Notes (Signed)
Cardiology Office Note  Date: 11/29/2020   ID: [name] [name], DOB 10/08/1945, MRN [id number]  PCP:  Leeanne Rio, MD  Cardiologist:  Carlyle Dolly, MD Electrophysiologist:  None   Chief Complaint: CAD, HTN, HLD, CVA, Subdural hematoma.   History of Present Illness: [name] [name] is a 75 y.o. male with a history of CAD( 2004 stents LAD and LCx), HTN, HLD.  Last encounter with Dr. Harl Bowie 07/26/2020.  Denied any recent chest pain, shortness of breath or DOE.  He was compliant with his medications.  He had been tried on multiple statins due to expereincing statin associated muscle symptoms.  He was tolerating simvastatin 40 mg daily.  Last LDL was 78.  He was compliant with this antihypertensive medications.  History of COPD, tobacco use.  Abnormal PFTs managed by PCP.  2019 no AAA  In the interim since last visit patient had a fall from a height of approximately 9 feet to the concrete floor from the loft in his barn and brief loss of consciousness.  He presented to [facility] on 08/05/2020 with primary complaint of nausea and pain in the back of his head, shoulders, and laceration to the right thumb and abrasion to left elbow.  He had a CT scan of his head showed evidence of the subdural hematoma on the right aspect of the falx measuring up to 4 mm in thickness with no mass-effect. His aspirin has been stopped in the interim since he has had the subdural hemorrhage.e states he was told by the discharge physicians at [facility] to follow-up with his cardiologist regarding restarting his metoprolol d/t slow HR.   Presented to [facility] on 10/28/2020 for occipital stroke.  Had sudden onset of right facial field defect followed by numbness of the hands and feet.  His symptoms completely resolved several hours after arrival to the emergency room.  CT of the head and neck showed severe stenosis of PCA with no flow distal to it.  He was on daily aspirin,  Plavix was added and statin was continued.  Blood pressure medication were held during hospital stay to allow for permissive hypertension.  He was advised to start metoprolol and resume losartan in a day or 2 if blood pressure was elevated.  His subdural hematoma had resolved per CT of the head.  He was discharged 10/31/2020.  Last visit he presented for follow-up recent CVA.  His Lopressor was started back at 12.5 mg p.o. twice daily.  Later his blood pressures were elevated and he was instructed to restart losartan at 25 mg daily.   He is here today for follow-up.  He brings with him a list of blood pressure measurements which appeared to be sustained greater than 130/80.  States he is having some occasional dizzy spells and is going back to see neurology to check on the symptoms.  States he is going to restart working with PT for his balance issues.  Blood pressure is 122/68 today.  Otherwise he denies any CVA or TIA-like symptoms.  Still has some mild visual disturbance since the stroke but is much better than initially.  He is using a cane to walk with today.  He denies any unilateral weakness.  No presyncopal or syncopal episodes.  No palpitations or arrhythmias.  No bleeding issues.  No PND, orthopnea.  Past Medical History:  Diagnosis Date  . Anemia   . Arthritis   . Asthma    with exertion  .  Back pain   . Cataracts, bilateral    immature  . Colitis    just finished one antibiotic and Cipro will be finished up  . COPD (chronic obstructive pulmonary disease) (HCC)    no meds required  . Coronary artery disease   . Diverticulosis   . GERD (gastroesophageal reflux disease)    takes Omeprazole daily  . Hemorrhoids   . History of bronchitis    many yrs ago  . History of colon polyps   . Hyperlipidemia    takes Simvastatin nightly  . Hypertension    takes Losartan and Metoprolol daily  . Joint pain   . Myocardial infarct ([place]) 2004  . Paget disease of bone    takes Fosamax daily   . Shortness of breath   . URI (upper respiratory infection)    Dec 31,2014  . Vertigo    takes Meclizine daily as needed    Past Surgical History:  Procedure Laterality Date  . ABDOMINAL EXPLORATION SURGERY    . APPENDECTOMY    . BACK SURGERY     x 2  . bilateral knee arthroscopies     x 3  . CARPAL TUNNEL RELEASE Right   . COLONOSCOPY    . CORONARY ANGIOPLASTY     3 stents  . DENTAL SURGERY    . ESOPHAGOGASTRODUODENOSCOPY    . HERNIA REPAIR     inguinal  . JOINT REPLACEMENT Left   . LEFT HEART CATHETERIZATION WITH CORONARY ANGIOGRAM N/A 08/27/2011   Procedure: LEFT HEART CATHETERIZATION WITH CORONARY ANGIOGRAM;  Surgeon: Hillary Bow, MD;  Location[facility] CATH LAB;  Service: Cardiovascular;  Laterality: N/A;  . LUMBAR LAMINECTOMY WITH COFLEX 2 LEVEL N/A 01/03/2015   Procedure: Lumbar three-four,Lumbar four-five Laminectomy with Coflex;  Surgeon: Kristeen Miss, MD;  Location: [place] NEURO ORS;  Service: Neurosurgery;  Laterality: N/A;  . TOTAL KNEE ARTHROPLASTY Right 11/16/2013   DR Mayer Camel  . TOTAL KNEE ARTHROPLASTY Right 11/16/2013   Procedure: TOTAL KNEE ARTHROPLASTY;  Surgeon: Kerin Salen, MD;  Location: [place];  Service: Orthopedics;  Laterality: Right;  . TOTAL KNEE ARTHROPLASTY Left 01/12/02    Current Outpatient Medications  Medication Sig Dispense Refill  . albuterol (PROVENTIL HFA;VENTOLIN HFA) 108 (90 Base) MCG/ACT inhaler Inhale 2 puffs into the lungs as needed for wheezing or shortness of breath.    Marland Kitchen aspirin 81 MG EC tablet Take 1 tablet by mouth daily.    . cetirizine (ZYRTEC) 10 MG tablet Take 10 mg by mouth daily.    . cholecalciferol (VITAMIN D) 1000 UNITS tablet Take 2,000 Units by mouth daily.    Marland Kitchen losartan (COZAAR) 50 MG tablet TAKE ONE TABLET BY MOUTH EVERY DAY 90 tablet 3  . metoprolol tartrate (LOPRESSOR) 25 MG tablet Take 0.5 tablets (12.5 mg total) by mouth 2 (two) times daily. 90 tablet 1  . Multiple Vitamin (MULTIVITAMIN) capsule Take 1 capsule by mouth  daily.    . nitroGLYCERIN (NITROSTAT) 0.4 MG SL tablet Place 1 tablet (0.4 mg total) under the tongue every 5 (five) minutes as needed. Chest pain (Patient taking differently: Place 0.4 mg under the tongue every 5 (five) minutes x 3 doses as needed. Chest pain) 25 tablet 3  . omeprazole (PRILOSEC) 40 MG capsule Take 40 mg by mouth daily.    . simvastatin (ZOCOR) 40 MG tablet TAKE ONE TABLET BY MOUTH AT BEDTIME 90 tablet 1  . traMADol (ULTRAM) 50 MG tablet Take 50 mg by mouth every 6 (six)  hours as needed.    . clopidogrel (PLAVIX) 75 MG tablet Take 1 tablet (75 mg total) by mouth daily. 90 tablet 1  . meclizine (ANTIVERT) 25 MG tablet Take 1 tablet (25 mg total) by mouth 3 (three) times daily as needed. 90 tablet 0   No current facility-administered medications for this visit.   Allergies:  Codeine   Social History: The patient  reports that he quit smoking about 22 years ago. He started smoking about 62 years ago. He has never used smokeless tobacco. He reports current alcohol use. He reports that he does not use drugs.   Family History: The patient's family history includes Atrial fibrillation in his sister; Breast cancer in his sister; Diabetes in his mother; Emphysema in his father; Heart attack in his brother, brother, and mother; Heart disease in his brother.   ROS:  Please see the history of present illness. Otherwise, complete review of systems is positive for none.  All other systems are reviewed and negative.   Physical Exam: VS:  BP 122/68   Pulse 68   Ht 5\' 10"  (1.778 m)   Wt 209 lb (94.8 kg)   SpO2 92%   BMI 29.99 kg/m , BMI Body mass index is 29.99 kg/m.  Wt Readings from Last 3 Encounters:  11/29/20 209 lb (94.8 kg)  11/08/20 212 lb 9.6 oz (96.4 kg)  11/01/20 212 lb 3.2 oz (96.3 kg)    General: Patient appears comfortable at rest. Neck: Supple, no elevated JVP or carotid bruits, no thyromegaly. Lungs: Clear to auscultation, nonlabored breathing at rest. Cardiac:  Regular rate and rhythm, no S3 or significant systolic murmur, no pericardial rub. Extremities: No pitting edema, distal pulses 2+. Skin: Warm and dry. Musculoskeletal: No kyphosis. Neuropsychiatric: Alert and oriented x3, affect grossly appropriate.  ECG:  07/26/2020 EKG normal sinus rhythm rate of 78.  Recent Labwork: 11/28/2020: BUN 16; Creatinine, Ser 1.03; Potassium 3.9; Sodium 136     Component Value Date/Time   CHOL 127 08/28/2011 0505   TRIG 189 (H) 08/28/2011 0505   HDL 31 (L) 08/28/2011 0505   CHOLHDL 4.1 08/28/2011 0505   VLDL 38 08/28/2011 0505   LDLCALC 58 08/28/2011 0505    Other Studies Reviewed Today:  CT head without contrast [facility] 10/28/2020 Impression  1. Resolution of previously seen para falcine subdural hematoma.  2. Acute left occipital cortical and subcortical infarction.  3. Chronic small-vessel ischemic changes of the cerebral hemispheric  white matter.  4. These results were called by telephoneat the time of  interpretation on 10/28/2020 at 10:23 pm to provider [facility] ,  who verbally acknowledged these results.    CT ANGIOGRAPHY HEAD AND NECK 10/28/2020 [facility] IMPRESSION: 1. Atherosclerotic disease at both carotid bifurcations. 20% or less stenosis of the ICA bulb on the right. No stenosis on the left. 2. Severe focal stenosis of the left posterior cerebral artery at the P1 P2 junction. The vessel does show flow distal to that however. 3. No other intracranial large or medium vessel occlusion or correctable proximal stenosis. 4. Emphysema and aortic atherosclerosis.   MRI head 10/31/2020 [facility] MRI HEAD WITHOUT CONTRAST IMPRESSION: 1. Small acute infarcts in the left occipital cortex. 2. Chronic small vessel ischemia in the cerebral white matter, mild for age.   Echocardiogram 10/31/2020 [facility] healthcare Summary  1. Technically difficult study.  2. The left ventricle is normal in size with mildly increased  wall  thickness.  3. The left ventricular systolic function  is normal, LVEF is visually  estimated at 65-70%.  4. There is grade I diastolic dysfunction (impaired relaxation).  5. The aortic valve is trileaflet with mildly thickened leaflets with normal  excursion.  6. The left atrium is mildly dilated in size.  7. The right ventricle is upper normal in size, with normal systolic  function.      CT head 09/06/2020 [facility] FINDINGS:   . Calvarium/skull base: No evidence of acute fracture or destructive lesion. Mastoids and middle ears demonstrate no substantial mucosal disease.   . Paranasalsinuses: Mild scattered inflammatory mucosal thickening. Note is made of multiple dental implants.   . Brain: Previous subdural hemorrhage seen layering along the right posterior falx has essentially resolved. There is minimal irregular thickening along the right posterior falx that could reflect trace blood products versus reactive dural thickening. No evidence for acute large vessel territory infarction. Unchanged patchy region of hypodensity in the right frontal subcortical white matter likely reflecting sequela prior ischemia. Similar background of presumed chronic small vessel ischemic changes and intracranial atherosclerosis. No mass lesion. No mass effect. No hydrocephalus. No acute hemorrhage.    08/2003 Cath FINDINGS:  1. Left main trunk: Large caliber vessel with mild irregularities.  2. LAD: This begins as a large caliber vessel and tapers in the distal  section. Provides two diagonal branches. The proximal LAD has mild  disease of 30%. The mid LAD has eccentric plaque of 50%. The distal LAD  has a high grade narrowing of 95% medially after the second diagonal  branch with TIMI 2 flow distally. The first diagonal branch has an  ostial narrowing of 50-60%. The second diagonal branch has an ostial  narrowing of 30-40% and is a large caliber vessel.   3. Left circumflex artery: This is a medium caliber vessel. Provides  trivial first marginal branch in the mid section and two marginal  branches distally. The proximal AV circumflex has high grade narrowing  of 70-80%. There is then further narrowing of 50-60% after the first  marginal branch. The first marginal branch has long tubular narrowing of  50% in the proximal segment. The second marginal branch has an ostial  narrowing of 80%. The third marginal branch has proximal narrowing of  70%.  4. Right coronary artery: Dominant. This is a large caliber vessel.  Provides the posterior descending artery and posterior ventricular branch  in terminal segment. The right coronary artery has diffuse disease of 30-  40% in the mid and distal section.  5. LV: Normal end-systolic and end-diastolic dimensions. Overall left  ventricular function is well preserved. Ejection fraction greater than  55%. No mitral regurgitation. There is akinesis of a small portion of  the mid anterior wall. LV pressure is 110/10. Aortic is 110/60. LVEDP  equals 15.  DESCRIPTION OF PROCEDURE: With these findings we elected to proceed with  percutaneous intervention to the distal LAD. The patient was given heparin  systemically as well as Reopro to maintain an ACT of approximately 300  seconds using 600 mg of Plavix. During the case he developed some itching  and was given Benadryl and Solu-Medrol with improvement of this. A 6-French  Q4 guide catheter was used to engage the left coronary artery and a 0.014  inch Asahi Prowater wire introduced. This was used to cross the stenosis  and position the distal LAD. A 2.5 x 15 mm Voyager balloon was introduced  and used to dilate the distal LAD lesion at 6 atmospheres  for 120 seconds.  Repeat angiography showed an improvement in vessel lumen. However, there  was severe residual plaque and it was felt that further intervention would  be required.  We introduced a 2.75 x 15 mm cutting balloon and a total of  six inflations were performed within the distal LAD at up to 10 atmospheres  for 60 seconds. Repeat angiography showed modest improvement in vessel  lumen. However, in the LAO cranial views there still appeared to be  residual narrowing of 70-80% at the origin of the stenosis. Some plaque  shifting had occurred into the diagonal branch as well. While we tried to  avoid stent placement, this became unavoidable. We positioned a 2.5 x 18 mm  Cypher stent into the distal LAD and deployed this at 10 atmospheres for 30  seconds. A 2.75 x 15 mm Quantum Maverick balloon was then used to post  dilate the stent. Two inflations were performed up to 14 atmospheres for 30  seconds each. Repeat angiography was then performed after the  administration of intracoronary nitroglycerin showing excellent result with  no residual stenosis in the distal LAD and improvement of TIMI grade 2 to  TIMI grade 3 flow in the distal LAD. There was some plaque shift to the  origin of the second diagonal branch of 70-80%. However, there was still  TIMI 3 flow in this vessel. We decided to accept this result. Final  angiography was performed in various projections showing no distal vessel  damage. The guide catheter was then removed. An AngioSeal closure device  was then deployed to the right femoral artery due to mild oozing around the  sheath. There was persistence of oozing and a FemStop was positioned until  hemostasis was achieved. The patient then transferred to the CCU in stable  condition. He tolerated the procedure well.  FINAL RESULTS: Successful PTCA and stent placement in the distal LAD with  reduction of 95% narrowing to 0% with placement of a 2.5 x 18 mm Cypher drug-  eluting stent dilated to 2.75 mm and improvement of TIMI grade 2 to TIMI  grade 3 flow.   08/2011 Echo LVEF 71-24%, grade I diastolic dysfunction,  difficult study.   09/04/13 EKG Normal sinus rhythm, no ischemic changes   Jan 2015 Stress MPI IMPRESSION: Low risk Lexiscan Cardiolite. No diagnostic ST segment changes were noted. Rare PACs observed. Perfusion imaging is consistent with diaphragmatic attenuation affecting the inferior wall, no clear evidence of scar or ischemia. LV volumes are normal and LVEF is 62% without wall motion abnormality.  09/25/14 Clinic EKG SR with PACs   06/2017 PFTs Mild ventilatory defect, +air trapping, moderately reduced DLCO  Assessment and Plan:   1. CAD in native artery Denies any recent anginal or exertional symptoms.  Aspirin started at 81 mg daily and Plavix was added added recently for CVA with recent admission to [facility].  Continue sublingual nitroglycerin as needed.    2. Mixed hyperlipidemia Continue simvastatin 40 mg p.o. daily.  Recent lipid panel 10/29/2020 demonstrated TG 108, TC 123, HDL 35, LDL 66.   3. Essential hypertension At last visit we restarted his Lopressor at 12.5 mg p.o. twice daily.  Later after nursing visit losartan was restarted at 25 mg daily.  He brings with him a log of blood pressures with sustained blood pressures greater than 130/80.  We are increasing his losartan back to his normal dose of 50 mg daily.  Advised to monitor blood pressure and if sustained  greater than 130/80 we will need to increase dose of metoprolol if needed.  Blood pressure today 122/68.  Continue metoprolol 12.5 mg p.o. twice daily.    4.  CVA  Had a recent CVA with vision loss and right visual field.  He was diagnosed with occipital CVA and admitted to [facility].  He was started on Plavix 75 mg daily and aspirin 81 mg daily.  MRI demonstrated small acute infarcts in the left occipital cortex. Chronic small vessel ischemia in the cerebral white matter, mild for age. CT scan showed resolution of previous subdural hematoma.  Continue Plavix 75 mg, aspirin 81 mg.   He states his visual field loss on right side is much better but still has some minor issues.  His wife states he is going back to neurology for his balance issues secondary to stroke and recent subdural hematoma from fall.  He is asking for refill on meclizine due to imbalance issues.  Please refill meclizine.  Continue aspirin 81 mg daily.  Continue Plavix 75 mg daily.  Medication Adjustments/Labs and Tests Ordered: Current medicines are reviewed at length with the patient today.  Concerns regarding medicines are outlined above.   Disposition: Follow-up with Dr. Harl Bowie or APP 6 months Signed, Levell July, NP 11/29/2020 10:09 AM    [facility] [place], [place], [place] [address] Phone: [phone]; Fax: [phone]

## 2020-11-29 ENCOUNTER — Ambulatory Visit (INDEPENDENT_AMBULATORY_CARE_PROVIDER_SITE_OTHER): Payer: Medicare Other | Admitting: Family Medicine

## 2020-11-29 ENCOUNTER — Encounter: Payer: Self-pay | Admitting: Family Medicine

## 2020-11-29 VITALS — BP 122/68 | HR 68 | Ht 70.0 in | Wt 209.0 lb

## 2020-11-29 DIAGNOSIS — I2510 Atherosclerotic heart disease of native coronary artery without angina pectoris: Secondary | ICD-10-CM

## 2020-11-29 DIAGNOSIS — E782 Mixed hyperlipidemia: Secondary | ICD-10-CM

## 2020-11-29 DIAGNOSIS — I63532 Cerebral infarction due to unspecified occlusion or stenosis of left posterior cerebral artery: Secondary | ICD-10-CM

## 2020-11-29 DIAGNOSIS — I10 Essential (primary) hypertension: Secondary | ICD-10-CM

## 2020-11-29 MED ORDER — MECLIZINE HCL 25 MG PO TABS: 25.0000 mg | ORAL_TABLET | Freq: Three times a day (TID) | ORAL | 0 refills | Status: DC | PRN

## 2020-11-29 MED ORDER — CLOPIDOGREL BISULFATE 75 MG PO TABS: 75.0000 mg | ORAL_TABLET | Freq: Every day | ORAL | 1 refills | Status: DC

## 2020-11-29 NOTE — Patient Instructions (Addendum)
Your physician recommends that you schedule a follow-up appointment in: [place] has recommended you make the following change in your medication:   TAKE LOSARTAN 50 MG DAILY     Thank you for choosing [facility]!!

## 2020-11-30 ENCOUNTER — Telehealth: Payer: Self-pay | Admitting: *Deleted

## 2020-11-30 NOTE — Telephone Encounter (Signed)
-----   Message from Verta Ellen., NP sent at 11/28/2020  3:44 PM EDT ----- Please let the patient know that his lab work looked okay except for his glucose was elevated at 174.  Thank you

## 2020-11-30 NOTE — Telephone Encounter (Signed)
Patient informed. Copy sent to PCP °

## 2021-01-26 ENCOUNTER — Other Ambulatory Visit: Payer: Self-pay | Admitting: Family Medicine

## 2021-01-26 ENCOUNTER — Other Ambulatory Visit: Payer: Self-pay | Admitting: Cardiology

## 2021-02-06 ENCOUNTER — Other Ambulatory Visit: Payer: Self-pay | Admitting: Orthopedic Surgery

## 2021-02-06 DIAGNOSIS — M25552 Pain in left hip: Secondary | ICD-10-CM

## 2021-02-14 ENCOUNTER — Other Ambulatory Visit: Payer: Self-pay | Admitting: Orthopedic Surgery

## 2021-02-14 DIAGNOSIS — M25552 Pain in left hip: Secondary | ICD-10-CM

## 2021-02-27 ENCOUNTER — Ambulatory Visit
Admission: RE | Admit: 2021-02-27 | Discharge: 2021-02-27 | Disposition: A | Discharge status: Home / Self Care | Payer: Medicare Other | Source: Ambulatory Visit | Attending: Orthopedic Surgery | Admitting: Orthopedic Surgery

## 2021-02-27 ENCOUNTER — Other Ambulatory Visit: Payer: Self-pay

## 2021-02-27 DIAGNOSIS — M25552 Pain in left hip: Secondary | ICD-10-CM

## 2021-03-02 ENCOUNTER — Telehealth: Payer: Self-pay | Admitting: Family Medicine

## 2021-03-02 NOTE — Telephone Encounter (Signed)
Patient states that his neurologist is wanting him to come off of Plavix and start on ASA 325mg 

## 2021-03-06 NOTE — Telephone Encounter (Signed)
We never started the plavix, I believe that was started during an admission with a stroke. From our standpoint that is fine  J Taedyn Glasscock MD

## 2021-03-06 NOTE — Telephone Encounter (Signed)
attempt to reach, left message to return call.

## 2021-03-06 NOTE — Telephone Encounter (Signed)
New message     Patient wife returned call about medication change?

## 2021-03-07 ENCOUNTER — Telehealth: Payer: Self-pay | Admitting: Cardiology

## 2021-03-07 NOTE — Telephone Encounter (Signed)
talked to [name] [name] about his Neurologist at [facility] waiting to stop  the Plavix and just take take full Aspirin    Please call the patient to let them know it is ok with Dr. Harl Bowie

## 2021-03-07 NOTE — Telephone Encounter (Signed)
[name] [name]  (wife)  [phone]   [phone]-[name] [name] called to confirm [place]/Dr Branch thinks it is ok to stop taking the Plavix & take full Aspirin instead. This was requested by his Neurologist at [facility].Please call they back to let them know if this is ok w/ Dr. Harl Bowie.

## 2021-03-07 NOTE — Telephone Encounter (Signed)
I spoke with wife and confirmed that Dr.Branch is ok with patient stopping plavix and starting ASA 325 mg.

## 2021-04-24 ENCOUNTER — Other Ambulatory Visit: Payer: Self-pay | Admitting: Cardiology

## 2021-06-13 ENCOUNTER — Ambulatory Visit: Payer: Medicare Other | Admitting: Cardiology

## 2021-07-06 ENCOUNTER — Other Ambulatory Visit: Payer: Self-pay | Admitting: Neurological Surgery

## 2021-07-07 ENCOUNTER — Other Ambulatory Visit (HOSPITAL_COMMUNITY): Payer: Self-pay | Admitting: Neurological Surgery

## 2021-07-07 ENCOUNTER — Other Ambulatory Visit: Payer: Self-pay | Admitting: Neurological Surgery

## 2021-07-07 DIAGNOSIS — M48062 Spinal stenosis, lumbar region with neurogenic claudication: Secondary | ICD-10-CM

## 2021-07-19 ENCOUNTER — Other Ambulatory Visit: Payer: Self-pay

## 2021-07-19 ENCOUNTER — Ambulatory Visit (HOSPITAL_COMMUNITY)
Admission: RE | Admit: 2021-07-19 | Discharge: 2021-07-19 | Disposition: A | Discharge status: Home / Self Care | Payer: Medicare Other | Source: Ambulatory Visit | Attending: Neurological Surgery | Admitting: Neurological Surgery

## 2021-07-19 DIAGNOSIS — M4726 Other spondylosis with radiculopathy, lumbar region: Secondary | ICD-10-CM | POA: Insufficient documentation

## 2021-07-19 DIAGNOSIS — M48062 Spinal stenosis, lumbar region with neurogenic claudication: Secondary | ICD-10-CM | POA: Diagnosis not present

## 2021-07-19 DIAGNOSIS — M5116 Intervertebral disc disorders with radiculopathy, lumbar region: Secondary | ICD-10-CM | POA: Insufficient documentation

## 2021-07-19 DIAGNOSIS — I700 Atherosclerosis of aorta: Secondary | ICD-10-CM | POA: Diagnosis not present

## 2021-07-19 DIAGNOSIS — N200 Calculus of kidney: Secondary | ICD-10-CM | POA: Diagnosis not present

## 2021-07-19 MED ORDER — HYDROCODONE-ACETAMINOPHEN 5-325 MG PO TABS
1.0000 | ORAL_TABLET | ORAL | Status: DC | PRN
Start: 2021-07-19 — End: 2021-07-20
  Administered 2021-07-19: 1 via ORAL

## 2021-07-19 MED ORDER — IOHEXOL 180 MG/ML  SOLN
20.0000 mL | Freq: Once | INTRAMUSCULAR | Status: AC | PRN
  Administered 2021-07-19: 12 mL via INTRATHECAL

## 2021-07-19 MED ORDER — LIDOCAINE HCL (PF) 1 % IJ SOLN
5.0000 mL | Freq: Once | INTRAMUSCULAR | Status: AC
  Administered 2021-07-19: 5 mL via INTRADERMAL

## 2021-07-19 MED ORDER — HYDROCODONE-ACETAMINOPHEN 5-325 MG PO TABS
ORAL_TABLET | ORAL | Status: AC
  Filled 2021-07-19: qty 2

## 2021-07-19 MED ORDER — DIAZEPAM 5 MG PO TABS: 10.0000 mg | ORAL_TABLET | Freq: Once | ORAL | Status: AC

## 2021-07-19 MED ORDER — ONDANSETRON HCL 4 MG/2ML IJ SOLN: 4.0000 mg | Freq: Four times a day (QID) | INTRAMUSCULAR | Status: DC | PRN

## 2021-07-19 MED ORDER — DIAZEPAM 5 MG PO TABS
ORAL_TABLET | ORAL | Status: AC
  Administered 2021-07-19: 10 mg via ORAL
  Filled 2021-07-19: qty 2

## 2021-07-19 NOTE — Procedures (Signed)
[name]. Scott is a 75 year old male whose had previous lumbar decompression at L3-4 and L4-5.  He had advanced degenerative changes in the facets with significant facet arthropathy.  He has been having chronic lumbar radicular pain that is evaluated now with a myelogram and a post myelogram CAT scan as he is implanted metal in the interspinous spaces from previous Coflex placement.  Pre op Dx: Lumbar radiculopathy chronic, history of decompression with Coflex Post op Dx: Same Procedure: Lumbar myelogram Surgeon: Kimie Pidcock Puncture level: L2-3 Fluid color: Clear colorless Injection: Isovue 12 mL 180 concentration Findings: Moderate stenosis L3-L4 lateral recess stenosis possible L4-L5 L5-S1.  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  Further evaluation with CT scanning.

## 2021-07-24 ENCOUNTER — Other Ambulatory Visit: Payer: Self-pay | Admitting: Cardiology

## 2021-08-16 ENCOUNTER — Encounter: Payer: Self-pay | Admitting: *Deleted

## 2021-08-17 ENCOUNTER — Encounter: Payer: Self-pay | Admitting: Cardiology

## 2021-08-17 ENCOUNTER — Ambulatory Visit: Payer: Medicare Other | Admitting: Cardiology

## 2021-08-17 VITALS — BP 160/80 | HR 68 | Ht 68.0 in | Wt 209.2 lb

## 2021-08-17 DIAGNOSIS — I2510 Atherosclerotic heart disease of native coronary artery without angina pectoris: Secondary | ICD-10-CM | POA: Diagnosis not present

## 2021-08-17 DIAGNOSIS — E782 Mixed hyperlipidemia: Secondary | ICD-10-CM

## 2021-08-17 DIAGNOSIS — I10 Essential (primary) hypertension: Secondary | ICD-10-CM | POA: Diagnosis not present

## 2021-08-17 MED ORDER — LOSARTAN POTASSIUM 100 MG PO TABS: 100.0000 mg | ORAL_TABLET | Freq: Every day | ORAL | 6 refills | Status: DC

## 2021-08-17 NOTE — Patient Instructions (Signed)
Medication Instructions:  Increase Losartan to 100mg  daily  Continue all other medications.     Labwork: BMET - order given today Please do in 2 weeks (around 08/31/2021) Office will contact with results via phone or letter.     Testing/Procedures: none  Follow-Up: Your physician wants you to follow up in: 6 months.  You will receive a reminder letter in the mail one-two months in advance.  If you don't receive a letter, please call our office to schedule the follow up appointment   Any Other Special Instructions Will Be Listed Below (If Applicable).   If you need a refill on your cardiac medications before your next appointment, please call your pharmacy.

## 2021-08-17 NOTE — Progress Notes (Signed)
Clinical Summary Mr. Littler is a 75 y.o.male seen today for follow up of the following medical problems.    1. CAD   - prior STEMI in 2004, received stent to LAD and LCX   - 08/2011 Echo LVEF 50-55%   - Jan 2015 Lexiscan MPI no ischemia       - no chest pain, no SOB/DOE - compliant with meds     2. Hyperlipidemia - followed by pcp  -Reports tried on more potent statins but caused muscle aches. Tolerating simva 40mg  daily well.      08/2019 TC 139 TG 130 HDL 38 LDL 78 -10/2020 TC 123 TG 108 HDL 35 LDL 66   3. HTN - home bp's 140s/80s - compliant with meds    4. COPD/Tobacco history - abnormal PFTs 06/2017, managed by pcp - 07/2018 no AAA  5. Subdural hematoma - 07/2020 SDH after fall   6. 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  He was on daily aspirin, Plavix was added and statin was continued.  - neurology stopped plavix and started high dose aspirin Past Medical History:  Diagnosis Date   Anemia    Arthritis    Asthma    with exertion   Back pain    Cataracts, bilateral    immature   Colitis    just finished one antibiotic and Cipro will be finished up   COPD (chronic obstructive pulmonary disease) (HCC)    no meds required   Coronary artery disease    Diverticulosis    GERD (gastroesophageal reflux disease)    takes Omeprazole daily   Hemorrhoids    History of bronchitis    many yrs ago   History of colon polyps    Hyperlipidemia    takes Simvastatin nightly   Hypertension    takes Losartan and Metoprolol daily   Joint pain    Myocardial infarct [facility]) 2004   Paget disease of bone    takes Fosamax daily   Shortness of breath    URI (upper respiratory infection)    Dec 31,2014   Vertigo    takes Meclizine daily as needed     Allergies  Allergen Reactions   Codeine Nausea Only     Current Outpatient Medications  Medication Sig  Dispense Refill   albuterol (PROVENTIL HFA;VENTOLIN HFA) 108 (90 Base) MCG/ACT inhaler Inhale 2 puffs into the lungs every 6 (six) hours as needed for wheezing or shortness of breath.     albuterol (PROVENTIL) (2.5 MG/3ML) 0.083% nebulizer solution Take 2.5 mg by nebulization every 6 (six) hours as needed for wheezing or shortness of breath.     aspirin EC 325 MG tablet Take 325 mg by mouth daily with lunch.     cetirizine (ZYRTEC) 10 MG tablet Take 10 mg by mouth daily.     cholecalciferol (VITAMIN D) 1000 UNITS tablet Take 1,000 Units by mouth daily in the afternoon.     diclofenac Sodium (VOLTAREN) 1 % GEL Apply 1 application topically 4 (four) times daily as needed (pain).     HYDROcodone-acetaminophen (NORCO/VICODIN) 5-325 MG tablet Take 1 tablet by mouth every 6 (six) hours as needed for moderate pain.     losartan (COZAAR) 50 MG tablet TAKE ONE TABLET BY MOUTH EVERY DAY 90 tablet 3   metoprolol tartrate (LOPRESSOR) 25 MG tablet TAKE 1/2 TABLET BY  MOUTH TWICE DAILY 90 tablet 2   Multiple Vitamin (MULTIVITAMIN) capsule Take 1 capsule by mouth daily in the afternoon.     nitroGLYCERIN (NITROSTAT) 0.4 MG SL tablet Place 1 tablet (0.4 mg total) under the tongue every 5 (five) minutes as needed. Chest pain 25 tablet 3   omeprazole (PRILOSEC) 40 MG capsule Take 40 mg by mouth daily.     simvastatin (ZOCOR) 40 MG tablet TAKE ONE TABLET BY MOUTH AT BEDTIME 90 tablet 1   No current facility-administered medications for this visit.     Past Surgical History:  Procedure Laterality Date   ABDOMINAL EXPLORATION SURGERY     APPENDECTOMY     BACK SURGERY     x 2   bilateral knee arthroscopies     x 3   CARPAL TUNNEL RELEASE Right    COLONOSCOPY     CORONARY ANGIOPLASTY     3 stents   DENTAL SURGERY     ESOPHAGOGASTRODUODENOSCOPY     HERNIA REPAIR     inguinal   JOINT REPLACEMENT Left    LEFT HEART CATHETERIZATION WITH CORONARY ANGIOGRAM N/A 08/27/2011   Procedure: LEFT HEART  CATHETERIZATION WITH CORONARY ANGIOGRAM;  Surgeon: Hillary Bow, MD;  Location: [facility] CATH LAB;  Service: Cardiovascular;  Laterality: N/A;   LUMBAR LAMINECTOMY WITH COFLEX 2 LEVEL N/A 01/03/2015   Procedure: Lumbar three-four,Lumbar four-five Laminectomy with Coflex;  Surgeon: Kristeen Miss, MD;  Location: [place] [facility];  Service: Neurosurgery;  Laterality: N/A;   TOTAL KNEE ARTHROPLASTY Right 11/16/2013   DR Mayer Camel   TOTAL KNEE ARTHROPLASTY Right 11/16/2013   Procedure: TOTAL KNEE ARTHROPLASTY;  Surgeon: Kerin Salen, MD;  Location: [facility];  Service: Orthopedics;  Laterality: Right;   TOTAL KNEE ARTHROPLASTY Left 01/12/02     Allergies  Allergen Reactions   Codeine Nausea Only      Family History  Problem Relation Age of Onset   Heart attack Mother    Diabetes Mother    Emphysema Father    Atrial fibrillation Sister    Heart attack Brother    Heart attack Brother    Heart disease Brother    Breast cancer Sister      Social History Mr. Boley reports that he quit smoking about 22 years ago. He started smoking about 63 years ago. He has never used smokeless tobacco. Mr. Stults reports current alcohol use.   Review of Systems CONSTITUTIONAL: No weight loss, fever, chills, weakness or fatigue.  HEENT: Eyes: No visual loss, blurred vision, double vision or yellow sclerae.No hearing loss, sneezing, congestion, runny nose or sore throat.  SKIN: No rash or itching.  CARDIOVASCULAR: per hpi RESPIRATORY: No shortness of breath, cough or sputum.  GASTROINTESTINAL: No anorexia, nausea, vomiting or diarrhea. No abdominal pain or blood.  GENITOURINARY: No burning on urination, no polyuria NEUROLOGICAL: No headache, dizziness, syncope, paralysis, ataxia, numbness or tingling in the extremities. No change in bowel or bladder control.  MUSCULOSKELETAL: No muscle, back pain, joint pain or stiffness.  LYMPHATICS: No enlarged nodes. No history of splenectomy.  PSYCHIATRIC: No history of  depression or anxiety.  ENDOCRINOLOGIC: No reports of sweating, cold or heat intolerance. No polyuria or polydipsia.  Marland Kitchen   Physical Examination Today's Vitals   08/17/21 1009  BP: (!) 160/80  Pulse: 68  SpO2: 98%  Weight: 209 lb 3.2 oz (94.9 kg)  Height: 5\' 8"  (1.727 m)   Body mass index is 31.81 kg/m.  Gen: resting comfortably, no acute distress HEENT:  no scleral icterus, pupils equal round and reactive, no palptable cervical adenopathy,  CV: RRR, no mr/g no jvd Resp: Clear to auscultation bilaterally GI: abdomen is soft, non-tender, non-distended, normal bowel sounds, no hepatosplenomegaly MSK: extremities are warm, no edema.  Skin: warm, no rash Neuro:  no focal deficits Psych: appropriate affect   Diagnostic Studies 08/2003 Cath   FINDINGS:   1. Left main trunk: Large caliber vessel with mild irregularities.   2. LAD: This begins as a large caliber vessel and tapers in the distal   section. Provides two diagonal branches. The proximal LAD has mild   disease of 30%. The mid LAD has eccentric plaque of 50%. The distal LAD   has a high grade narrowing of 95% medially after the second diagonal   Rudi Bunyard with TIMI 2 flow distally. The first diagonal Carla Rashad has an   ostial narrowing of 50-60%. The second diagonal Graceland Wachter has an ostial   narrowing of 30-40% and is a large caliber vessel.   3. Left circumflex artery: This is a medium caliber vessel. Provides   trivial first marginal Gordon Vandunk in the mid section and two marginal   branches distally. The proximal AV circumflex has high grade narrowing   of 70-80%. There is then further narrowing of 50-60% after the first   marginal Yuji Walth. The first marginal Jeyli Zwicker has long tubular narrowing of   50% in the proximal segment. The second marginal Erza Mothershead has an ostial   narrowing of 80%. The third marginal Ayannah Faddis has proximal narrowing of   70%.   4. Right coronary artery: Dominant. This is a large caliber vessel.   Provides the  posterior descending artery and posterior ventricular Treyana Sturgell   in terminal segment. The right coronary artery has diffuse disease of 30-   40% in the mid and distal section.   5. LV: Normal end-systolic and end-diastolic dimensions. Overall left   ventricular function is well preserved. Ejection fraction greater than   55%. No mitral regurgitation. There is akinesis of a small portion of   the mid anterior wall. LV pressure is 110/10. Aortic is 110/60. LVEDP   equals 15.   DESCRIPTION OF PROCEDURE: With these findings we elected to proceed with   percutaneous intervention to the distal LAD. The patient was given heparin   systemically as well as Reopro to maintain an ACT of approximately 300   seconds using 600 mg of Plavix. During the case he developed some itching   and was given Benadryl and Solu-Medrol with improvement of this. A 6-French   Q4 guide catheter was used to engage the left coronary artery and a 0.014   inch Asahi Prowater wire introduced. This was used to cross the stenosis   and position the distal LAD. A 2.5 x 15 mm Voyager balloon was introduced   and used to dilate the distal LAD lesion at 6 atmospheres for 120 seconds.   Repeat angiography showed an improvement in vessel lumen. However, there   was severe residual plaque and it was felt that further intervention would   be required. We introduced a 2.75 x 15 mm cutting balloon and a total of   six inflations were performed within the distal LAD at up to 10 atmospheres   for 60 seconds. Repeat angiography showed modest improvement in vessel   lumen. However, in the LAO cranial views there still appeared to be   residual narrowing of 70-80% at the origin of the stenosis. Some plaque   shifting  had occurred into the diagonal Jess Sulak as well. While we tried to   avoid stent placement, this became unavoidable. We positioned a 2.5 x 18 mm   Cypher stent into the distal LAD and deployed this at 10 atmospheres for 30    seconds. A 2.75 x 15 mm Quantum Maverick balloon was then used to post   dilate the stent. Two inflations were performed up to 14 atmospheres for 30   seconds each. Repeat angiography was then performed after the   administration of intracoronary nitroglycerin showing excellent result with   no residual stenosis in the distal LAD and improvement of TIMI grade 2 to   TIMI grade 3 flow in the distal LAD. There was some plaque shift to the   origin of the second diagonal Delorise Hunkele of 70-80%. However, there was still   TIMI 3 flow in this vessel. We decided to accept this result. Final   angiography was performed in various projections showing no distal vessel   damage. The guide catheter was then removed. An AngioSeal closure device   was then deployed to the right femoral artery due to mild oozing around the   sheath. There was persistence of oozing and a FemStop was positioned until   hemostasis was achieved. The patient then transferred to the CCU in stable   condition. He tolerated the procedure well.   FINAL RESULTS: Successful PTCA and stent placement in the distal LAD with   reduction of 95% narrowing to 0% with placement of a 2.5 x 18 mm Cypher drug-   eluting stent dilated to 2.75 mm and improvement of TIMI grade 2 to TIMI   grade 3 flow.     08/2011 Echo   LVEF 35-00%, grade I diastolic dysfunction, difficult study.     09/04/13 EKG   Normal sinus rhythm, no ischemic changes     Jan 2015 Stress MPI IMPRESSION: Low risk Lexiscan Cardiolite. No diagnostic ST segment changes were noted. Rare PACs observed. Perfusion imaging is consistent with diaphragmatic attenuation affecting the inferior wall, no clear evidence of scar or ischemia. LV volumes are normal and LVEF is 62% without wall motion abnormality.   09/25/14 Clinic EKG SR with PACs     06/2017 PFTs Mild ventilatory defect, +air trapping, moderately reduced DLCO    Assessment and Plan  1. CAD   -no symptoms, we  will continue current meds       2 . Hyperlipidemia - reports muscle aches on more potent statins -LDL is at goal, continue simvastatin   3. HTN Above goal, increase losartan to 100mg  daily. BMET in 2 weeks   F/u 6 months      Arnoldo Lenis, M.D

## 2021-08-17 NOTE — Addendum Note (Signed)
Addended by: Laurine Blazer on: 08/17/2021 10:32 AM   Modules accepted: Orders

## 2021-09-01 ENCOUNTER — Other Ambulatory Visit (HOSPITAL_COMMUNITY)
Admission: RE | Admit: 2021-09-01 | Discharge: 2021-09-01 | Disposition: A | Discharge status: Home / Self Care | Payer: Medicare Other | Source: Ambulatory Visit | Attending: Cardiology | Admitting: Cardiology

## 2021-09-01 DIAGNOSIS — I10 Essential (primary) hypertension: Secondary | ICD-10-CM | POA: Diagnosis present

## 2021-09-01 LAB — BASIC METABOLIC PANEL
Anion gap: 10 (ref 5–15)
BUN: 15 mg/dL (ref 8–23)
CO2: 24 mmol/L (ref 22–32)
Calcium: 8.9 mg/dL (ref 8.9–10.3)
Chloride: 102 mmol/L (ref 98–111)
Creatinine, Ser: 0.89 mg/dL (ref 0.61–1.24)
GFR, Estimated: >60 mL/min (ref >60)
Glucose, Bld: 132 mg/dL — ABNORMAL HIGH (ref 70–99)
Potassium: 4.1 mmol/L (ref 3.5–5.1)
Sodium: 136 mmol/L (ref 135–145)

## 2021-09-13 ENCOUNTER — Encounter: Payer: Self-pay | Admitting: *Deleted

## 2021-10-24 ENCOUNTER — Other Ambulatory Visit: Payer: Self-pay | Admitting: Cardiology

## 2021-10-30 ENCOUNTER — Encounter: Payer: Self-pay | Admitting: Cardiology

## 2021-10-30 NOTE — Telephone Encounter (Signed)
Complex assessment with paxlovid and medication interactions. Tye Maryland could you forward this to our clinical pharmacist please to help  Zandra Abts MD

## 2021-10-31 NOTE — Telephone Encounter (Signed)
He needs to stop the simvastatin and not take again until 3 days after stopping paxlovid.   Also, there is a chance paxlovid can increase the effects of metoprolol (it's minor), but have him check his heart rate a few times to make sure it doesn't decrease significantly.  Otherwise fine.

## 2021-12-06 ENCOUNTER — Encounter (INDEPENDENT_AMBULATORY_CARE_PROVIDER_SITE_OTHER): Payer: Self-pay | Admitting: *Deleted

## 2021-12-27 ENCOUNTER — Encounter (INDEPENDENT_AMBULATORY_CARE_PROVIDER_SITE_OTHER): Payer: Self-pay

## 2021-12-27 ENCOUNTER — Other Ambulatory Visit (INDEPENDENT_AMBULATORY_CARE_PROVIDER_SITE_OTHER): Payer: Self-pay

## 2021-12-27 ENCOUNTER — Encounter (INDEPENDENT_AMBULATORY_CARE_PROVIDER_SITE_OTHER): Payer: Self-pay | Admitting: *Deleted

## 2021-12-27 ENCOUNTER — Telehealth (INDEPENDENT_AMBULATORY_CARE_PROVIDER_SITE_OTHER): Payer: Self-pay

## 2021-12-27 ENCOUNTER — Telehealth: Payer: Self-pay | Admitting: *Deleted

## 2021-12-27 DIAGNOSIS — Z1211 Encounter for screening for malignant neoplasm of colon: Secondary | ICD-10-CM

## 2021-12-27 MED ORDER — NA SULFATE-K SULFATE-MG SULF 17.5-3.13-1.6 GM/177ML PO SOLN: 1.0000 | Freq: Once | ORAL | 0 refills | Status: AC

## 2021-12-27 NOTE — Telephone Encounter (Signed)
Referring MD/PCP: Huel Cote ? ?Procedure: Tcs  ? ?Reason/Indication:  Screening  ? ?Has patient had this procedure before?  Yes  ? If so, when, by whom and where?10/2011 at [facility]    ? ?Is there a family history of colon cancer?  no ? Who?  What age when diagnosed?   ? ?Is patient diabetic? If yes, Type 1 or Type 2   no ?     ?Does patient have prosthetic heart valve or mechanical valve?  no ? ?Do you have a pacemaker/defibrillator?  no ? ?Has patient ever had endocarditis/atrial fibrillation? no ? ?Does patient use oxygen? no ? ?Has patient had joint replacement within last 12 months?  no ? ?Is patient constipated or do they take laxatives? no ? ?Does patient have a history of alcohol/drug use?  no ? ?Have you had a stroke/heart attack last 6 mths? no ? ?Do you take medicine for weight loss?  no ? ?For male patients,: have you had a hysterectomy N/A ?                     are you post menopausal N/A ?                     do you still have your menstrual cycle N/A ? ?Is patient on blood thinner such as Coumadin, Plavix and/or Aspirin? yes ? ?Medications: asa 325 mg daily, omeprazole 40 mg daily, metoprolol 25 mg 1/2 tab bid, losartan 100 mg daily, simvastatin 40 mg daily, VIt D3 2000 IU daily, MVI daily, cetirizine 10 mg daily albuterol inhaler prn ? ?Allergies: codeine  ? ?Medication Adjustment per Dr Jenetta Downer: hold asa '325mg'$  5 days prior ? ?Procedure date & time: 01/23/22 at  10:10 ? ? ?

## 2021-12-27 NOTE — Telephone Encounter (Signed)
Pt agreeable to plan of care for tele pre op appt 01/01/22 @ 1 pm. Med rec and consent are done. ?

## 2021-12-27 NOTE — Telephone Encounter (Signed)
? ?  Pre-operative Risk Assessment  ?  ?Patient Name: [name] [name]  ?DOB: Nov 20, 1945 ?MRN: [id number]  ? ?  ? ?Request for Surgical Clearance   ? ?Procedure:   COLONOSCOPY ? ?Date of Surgery:  Clearance 01/23/22                              ?   ?Surgeon:  DR. DANIEL CASTANEDA ?Surgeon's Group or Practice Name:  [facility] ?Phone number:  [phone] ?Fax number:  [phone] ?  ?Type of Clearance Requested:   ?- Medical  ?- Pharmacy:  Hold Aspirin 325 MG x 5 DAYS PRIOR ?  ?Type of Anesthesia:  MAC ?  ?Additional requests/questions:   ? ?Signed, ?Julaine Hua   ?12/27/2021, 2:01 PM  ? ?

## 2021-12-27 NOTE — Telephone Encounter (Signed)
Pt agreeable to plan of care for tele pre op appt 01/01/22 @ 1 pm. Med rec and consent are done. ? ?  ?Patient Consent for Virtual Visit  ? ? ?   ? ?[name] [name] has provided verbal consent on 12/27/2021 for a virtual visit (video or telephone). ? ? ?CONSENT FOR VIRTUAL VISIT FOR:  [name] [name]  ?By participating in this virtual visit I agree to the following: ? ?I hereby voluntarily request, consent and authorize [facility] and its employed or contracted physicians, physician assistants, nurse practitioners or other licensed health care professionals (the Practitioner), to provide me with telemedicine health care services (the ?Services") as deemed necessary by the treating Practitioner. I acknowledge and consent to receive the Services by the Practitioner via telemedicine. I understand that the telemedicine visit will involve communicating with the Practitioner through live audiovisual communication technology and the disclosure of certain medical information by electronic transmission. I acknowledge that I have been given the opportunity to request an in-person assessment or other available alternative prior to the telemedicine visit and am voluntarily participating in the telemedicine visit. ? ?I understand that I have the right to withhold or withdraw my consent to the use of telemedicine in the course of my care at any time, without affecting my right to future care or treatment, and that the Practitioner or I may terminate the telemedicine visit at any time. I understand that I have the right to inspect all information obtained and/or recorded in the course of the telemedicine visit and may receive copies of available information for a reasonable fee.  I understand that some of the potential risks of receiving the Services via telemedicine include:  ?Delay or interruption in medical evaluation due to technological equipment failure or disruption; ?Information transmitted may not be sufficient (e.g.  poor resolution of images) to allow for appropriate medical decision making by the Practitioner; and/or  ?In rare instances, security protocols could fail, causing a breach of personal health information. ? ?Furthermore, I acknowledge that it is my responsibility to provide information about my medical history, conditions and care that is complete and accurate to the best of my ability. I acknowledge that Practitioner's advice, recommendations, and/or decision may be based on factors not within their control, such as incomplete or inaccurate data provided by me or distortions of diagnostic images or specimens that may result from electronic transmissions. I understand that the practice of medicine is not an exact science and that Practitioner makes no warranties or guarantees regarding treatment outcomes. I acknowledge that a copy of this consent can be made available to me via my patient portal ([facility]), or I can request a printed copy by calling the office of [facility].   ? ?I understand that my insurance will be billed for this visit.  ? ?I have read or had this consent read to me. ?I understand the contents of this consent, which adequately explains the benefits and risks of the Services being provided via telemedicine.  ?I have been provided ample opportunity to ask questions regarding this consent and the Services and have had my questions answered to my satisfaction. ?I give my informed consent for the services to be provided through the use of telemedicine in my medical care ? ? ? ?

## 2021-12-27 NOTE — Telephone Encounter (Signed)
Ok to schedule.  Thanks,  Luria Rosario Castaneda Mayorga, MD Gastroenterology and Hepatology [facility]  

## 2021-12-27 NOTE — Telephone Encounter (Signed)
Primary Cardiologist:Branch, Roderic Palau, MD ? ?Chart reviewed as part of pre-operative protocol coverage. Because of Gale B Celona's past medical history and time since last visit, he/she will require a virtual visit/telephone call in order to better assess preoperative cardiovascular risk. ? ?Pre-op covering staff: ?- Please contact patient, obtain consent, and schedule appointment  ?- Please notify surgeon's office that high dose aspirin is prescribed by neurology and therefore clearance to hold will need to come from neurology ? ?Emmaline Life, NP-C ? ?  ?12/27/2021, 2:12 PM ?[place] ?[address] ?Office [phone] Fax [phone] ? ?

## 2021-12-28 NOTE — Telephone Encounter (Signed)
Ferlando Lia Ann Linc Renne, CMA  ?

## 2022-01-01 ENCOUNTER — Encounter: Payer: Self-pay | Admitting: Physician Assistant

## 2022-01-01 ENCOUNTER — Ambulatory Visit (INDEPENDENT_AMBULATORY_CARE_PROVIDER_SITE_OTHER): Payer: Medicare Other | Admitting: Physician Assistant

## 2022-01-01 VITALS — BP 133/74 | HR 77

## 2022-01-01 DIAGNOSIS — Z01810 Encounter for preprocedural cardiovascular examination: Secondary | ICD-10-CM

## 2022-01-01 NOTE — Telephone Encounter (Addendum)
Contacted the patient to schedule a follow up appointment.  ? ?Appointment scheduled with [name] [name], [place] at the [place] office on 01/12/22 at 2pm. Patient agreeable and voiced understanding.  ?

## 2022-01-01 NOTE — Progress Notes (Addendum)
? ?Virtual Visit via Telephone Note  ? ?This visit type was conducted due to national recommendations for restrictions regarding the COVID-19 Pandemic (e.g. social distancing) in an effort to limit this patient's exposure and mitigate transmission in our community.  Due to his co-morbid illnesses, this patient is at least at moderate risk for complications without adequate follow up.  This format is felt to be most appropriate for this patient at this time.  The patient did not have access to video technology/had technical difficulties with video requiring transitioning to audio format only (telephone).  All issues noted in this document were discussed and addressed.  No physical exam could be performed with this format.  Please refer to the patient's chart for his  consent to telehealth for [facility]. ? ?Evaluation Performed:  Preoperative cardiovascular risk assessment ?_____________  ? ?Date:  01/01/2022  ? ?Patient ID:  [name] [name], [name] [name] [birth year], MRN [id number] ?Patient Location:  ?Home ?Provider location:   ?Office ? ?Primary Care Provider:  Leeanne Rio, MD ?Primary Cardiologist:  Carlyle Dolly, MD ? ?Chief Complaint  ?  ?76 y.o. y/o male with a h/o CAD (prior STEMI 2004 s/p PCI to LAD, LCx), HLD, HTN, COPD, tobacco history, SDH after fall, CVA 10/2020, who is pending colonoscopy, and presents today for telephonic preoperative cardiovascular risk assessment. ? ?Past Medical History  ?  ?Past Medical History:  ?Diagnosis Date  ? Anemia   ? Arthritis   ? Asthma   ? with exertion  ? Back pain   ? Cataracts, bilateral   ? immature  ? Colitis   ? just finished one antibiotic and Cipro will be finished up  ? COPD (chronic obstructive pulmonary disease) ([place])   ? no meds required  ? Coronary artery disease   ? Diverticulosis   ? GERD (gastroesophageal reflux disease)   ? takes Omeprazole daily  ? Hemorrhoids   ? History of bronchitis   ? many yrs ago  ? History of colon polyps   ? Hyperlipidemia   ?  takes Simvastatin nightly  ? Hypertension   ? takes Losartan and Metoprolol daily  ? Joint pain   ? Myocardial infarct [facility]) 2004  ? Paget disease of bone   ? takes Fosamax daily  ? Shortness of breath   ? URI (upper respiratory infection)   ? Dec 31,2014  ? Vertigo   ? takes Meclizine daily as needed  ? ?Past Surgical History:  ?Procedure Laterality Date  ? ABDOMINAL EXPLORATION SURGERY    ? APPENDECTOMY    ? BACK SURGERY    ? x 2  ? bilateral knee arthroscopies    ? x 3  ? CARPAL TUNNEL RELEASE Right   ? COLONOSCOPY    ? CORONARY ANGIOPLASTY    ? 3 stents  ? DENTAL SURGERY    ? ESOPHAGOGASTRODUODENOSCOPY    ? HERNIA REPAIR    ? inguinal  ? JOINT REPLACEMENT Left   ? LEFT HEART CATHETERIZATION WITH CORONARY ANGIOGRAM N/A 08/27/2011  ? Procedure: LEFT HEART CATHETERIZATION WITH CORONARY ANGIOGRAM;  Surgeon: Hillary Bow, MD;  Location: [facility] CATH LAB;  Service: Cardiovascular;  Laterality: N/A;  ? LUMBAR LAMINECTOMY WITH COFLEX 2 LEVEL N/A 01/03/2015  ? Procedure: Lumbar three-four,Lumbar four-five Laminectomy with Coflex;  Surgeon: Kristeen Miss, MD;  Location: [place] NEURO ORS;  Service: Neurosurgery;  Laterality: N/A;  ? TOTAL KNEE ARTHROPLASTY Right 11/16/2013  ? DR Mayer Camel  ? TOTAL KNEE ARTHROPLASTY Right 11/16/2013  ? Procedure: TOTAL KNEE  ARTHROPLASTY;  Surgeon: Kerin Salen, MD;  Location: [place];  Service: Orthopedics;  Laterality: Right;  ? TOTAL KNEE ARTHROPLASTY Left 01/12/02  ? ? ?Allergies ? ?Allergies  ?Allergen Reactions  ? Codeine Nausea Only  ? ? ?History of Present Illness  ?  ?[name] [name] is a 76 y.o. male who presents via audio/video conferencing for a telehealth visit today.  Pt was last seen in cardiology clinic on 08/2021 by Dr. Harl Bowie.  At that time [name] [name] was doing well.  The patient is now pending colonoscopy on 01/23/22.  Since his last visit, he is more short of breath doing any activity. No CP. He had Covid several months back and thought he would recover from this but continues to  experience dyspnea on exertion that is worse from his prior baseline. He tires out with even small amounts of activity. Due to this, I have recommended we defer virtual clearance in lieu of scheduling an in-office visit for evaluation and subsequent clearance if appropriate. ? ? ?Home Medications  ?  ?Prior to Admission medications   ?Medication Sig Start Date End Date Taking? Authorizing Provider  ?albuterol (PROVENTIL HFA;VENTOLIN HFA) 108 (90 Base) MCG/ACT inhaler Inhale 2 puffs into the lungs every 6 (six) hours as needed for wheezing or shortness of breath.    [provider]  ?albuterol (PROVENTIL) (2.5 MG/3ML) 0.083% nebulizer solution Take 2.5 mg by nebulization every 6 (six) hours as needed for wheezing or shortness of breath.    [provider]  ?aspirin EC 325 MG tablet Take 325 mg by mouth daily with lunch.    [provider]  ?cetirizine (ZYRTEC) 10 MG tablet Take 10 mg by mouth daily.    [provider]  ?cholecalciferol (VITAMIN D) 1000 UNITS tablet Take 1,000 Units by mouth daily in the afternoon.    [provider]  ?diclofenac Sodium (VOLTAREN) 1 % GEL Apply 1 application topically 4 (four) times daily as needed (pain).    [provider]  ?HYDROcodone-acetaminophen (NORCO/VICODIN) 5-325 MG tablet Take 1 tablet by mouth every 6 (six) hours as needed for moderate pain.    [provider]  ?losartan (COZAAR) 100 MG tablet TAKE ONE TABLET BY MOUTH DAILY 10/24/21   Arnoldo Lenis, MD  ?metoprolol tartrate (LOPRESSOR) 25 MG tablet TAKE 1/2 TABLET BY MOUTH TWICE DAILY 10/24/21   Arnoldo Lenis, MD  ?Multiple Vitamin (MULTIVITAMIN) capsule Take 1 capsule by mouth daily in the afternoon.    [provider]  ?nitroGLYCERIN (NITROSTAT) 0.4 MG SL tablet Place 1 tablet (0.4 mg total) under the tongue every 5 (five) minutes as needed. Chest pain 07/23/19   Arnoldo Lenis, MD  ?omeprazole (PRILOSEC) 40 MG capsule Take 40 mg by mouth  daily.    [provider]  ?simvastatin (ZOCOR) 40 MG tablet TAKE ONE TABLET BY MOUTH AT BEDTIME 07/24/21   Arnoldo Lenis, MD  ? ? ?Physical Exam  ?  ?Vital Signs:  BP 122/74   Pulse 71  ? ?Given telephonic nature of communication, physical exam is limited. ?AAOx3. NAD. Normal affect.  Speech and respirations are unlabored. ? ?Accessory Clinical Findings  ?  ?None ? ?Assessment & Plan  ?  ?1.  Preoperative Cardiovascular Risk Assessment: Since his last visit, he is more short of breath doing any activity. He had Covid several months back and thought he would recover from this but continues to experience dyspnea on exertion that is worse from his prior  baseline. Though colonoscopy is fairly low risk, given worsening symptoms, I have recommended we defer virtual clearance in lieu of scheduling an in-office visit for evaluation and possible labs with subsequent clearance if appropriate at that time. ? ?Regarding high dose ASA, he is only on high dose due to history of stroke as previously recommended by neurology, therefore would recommend GI team reach out to patient's PCP to review holding for colonoscopy as requested. He does not see a neurologist any longer. (Otherwise he only has indication to be on low dose ASA from cardiac standpoint.) ? ?A copy of this note will be routed to requesting surgeon. ? ?Time:   ?Today, I have spent 5 minutes with the patient with telehealth technology discussing medical history, symptoms, and management plan.  No charge visit given need for in office visit. Will route request to callback to schedule in original telephone request. ? ? ?Charlie Pitter, PA-C ? ?01/01/2022, 1:14 PM ? ?

## 2022-01-01 NOTE — Telephone Encounter (Signed)
? ?  Patient Name: [name] [name]  ?DOB: 12/05/1945 ?MRN: [id number] ? ?Primary Cardiologist: Carlyle Dolly, MD ? ?Chart reviewed as part of pre-operative protocol coverage. Patient was set up for virtual visit today. During virtual visit he reported worsening SOB over the last few months. Therefore needs in office visit, please arrange and call pt with appt information. Based on my knowledge of [facility] availability, may need to offer @ [facility] with [facility]. ? ?I have routed copy of my note to requesting GI team already to let them know that further in office eval is forthcoming. ? ?[name] Pitter, PA-C ?01/01/2022, 1:22 PM ? ? ?

## 2022-01-02 ENCOUNTER — Encounter (INDEPENDENT_AMBULATORY_CARE_PROVIDER_SITE_OTHER): Payer: Self-pay

## 2022-01-02 ENCOUNTER — Telehealth (INDEPENDENT_AMBULATORY_CARE_PROVIDER_SITE_OTHER): Payer: Self-pay

## 2022-01-02 NOTE — Telephone Encounter (Signed)
Caston Coopersmith Ann Yachet Mattson, CMA  ?

## 2022-01-04 ENCOUNTER — Telehealth (INDEPENDENT_AMBULATORY_CARE_PROVIDER_SITE_OTHER): Payer: Self-pay

## 2022-01-04 NOTE — Telephone Encounter (Signed)
Per Dr [name] may hold his Aspirin 325 mg for 5 days prior to procedure. ?

## 2022-01-04 NOTE — Telephone Encounter (Signed)
Thanks, he can take baby aspirin during those 5 days in the meantime ?Please let him know about these recs ?

## 2022-01-12 ENCOUNTER — Encounter: Payer: Self-pay | Admitting: Student

## 2022-01-12 ENCOUNTER — Ambulatory Visit: Payer: Medicare Other | Admitting: Student

## 2022-01-12 ENCOUNTER — Encounter: Payer: Self-pay | Admitting: *Deleted

## 2022-01-12 VITALS — BP 130/80 | HR 68 | Ht 70.0 in | Wt 217.0 lb

## 2022-01-12 DIAGNOSIS — I10 Essential (primary) hypertension: Secondary | ICD-10-CM | POA: Diagnosis not present

## 2022-01-12 DIAGNOSIS — Z8673 Personal history of transient ischemic attack (TIA), and cerebral infarction without residual deficits: Secondary | ICD-10-CM

## 2022-01-12 DIAGNOSIS — E782 Mixed hyperlipidemia: Secondary | ICD-10-CM | POA: Diagnosis not present

## 2022-01-12 DIAGNOSIS — I25118 Atherosclerotic heart disease of native coronary artery with other forms of angina pectoris: Secondary | ICD-10-CM

## 2022-01-12 DIAGNOSIS — Z01810 Encounter for preprocedural cardiovascular examination: Secondary | ICD-10-CM

## 2022-01-12 DIAGNOSIS — R0609 Other forms of dyspnea: Secondary | ICD-10-CM

## 2022-01-12 NOTE — Progress Notes (Signed)
? ?Cardiology Office Note   ? ?Date:  01/12/2022  ? ?ID:  [name] [name], DOB [birth year], MRN [id number] ? ?PCP:  Leeanne Rio, MD  ?Cardiologist: Carlyle Dolly, MD   ? ?Chief Complaint  ?Patient presents with  ? Follow-up  ?  6 month visit; Preop clearance  ? ? ?History of Present Illness:   ? ?[name] [name] is a 76 y.o. male with past medical history of CAD (s/p STEMI in 2004 with stenting to LAD and LCx, low-risk NST in 2015), HTN, HLD, COPD, history of CVA and history of SDH (occurring after fall in 07/2020) who presents to the office today for 28-monthfollow-up and preoperative cardiac clearance. ? ?He was last examined by Dr. BHarl Bowiein 08/2021 and denied any recent anginal symptoms at that time. His BP was elevated, therefore Losartan was increased to 100 mg daily. He was continued on Simvastatin as he had myalgias with higher intensity statin therapy in the past. ? ?In the interim, the office received a cardiac clearance request from upcoming colonoscopy and it was asked if he could hold ASA 325 mg daily for 5 days and this was deferred to Neurology or his PCP as he had been on high-dose ASA per their recommendations. A telehealth visit was performed for medical clearance and he did report more dyspnea on exertion but denied any chest pain. He reported having fatigue with minimal activity, therefore an in office evaluation was recommended prior to providing clearance. ? ?In talking with the patient and his wife today, he reports having worsening fatigue and dyspnea on exertion for the past 5+ months. He did have COVID-19 in 10/2021 and feels like his symptoms worsened after that but he was having dyspnea prior to this. He does try to stay active around his home and enjoys doing yard work but has to stop and rest more frequently than he did previously. He denies any associated chest pain or palpitations. No specific orthopnea, PND or pitting edema. Reports occasional dizziness with positional  changes.  ? ? ?Past Medical History:  ?Diagnosis Date  ? Anemia   ? Arthritis   ? Asthma   ? with exertion  ? Back pain   ? CAD (coronary artery disease)   ? a. s/p STEMI in 2004 with stenting to LAD and LCx b. low-risk NST in 2015  ? Cataracts, bilateral   ? immature  ? Colitis   ? just finished one antibiotic and Cipro will be finished up  ? [place]   ? no meds required  ? Coronary artery disease   ? Diverticulosis   ? GERD (gastroesophageal reflux disease)   ? takes Omeprazole daily  ? Hemorrhoids   ? History of bronchitis   ? many yrs ago  ? History of colon polyps   ? Hyperlipidemia   ? takes Simvastatin nightly  ? Hypertension   ? takes Losartan and Metoprolol daily  ? Joint pain   ? [place]  ? Paget disease of bone   ? takes Fosamax daily  ? Shortness of breath   ? URI (upper respiratory infection)   ? Dec 31,2014  ? Vertigo   ? takes Meclizine daily as needed  ? ? ?Past Surgical History:  ?Procedure Laterality Date  ? ABDOMINAL EXPLORATION SURGERY    ? APPENDECTOMY    ? BACK SURGERY    ? x 2  ? bilateral knee arthroscopies    ? x 3  ?  CARPAL TUNNEL RELEASE Right   ? COLONOSCOPY    ? CORONARY ANGIOPLASTY    ? 3 stents  ? DENTAL SURGERY    ? ESOPHAGOGASTRODUODENOSCOPY    ? HERNIA REPAIR    ? inguinal  ? JOINT REPLACEMENT Left   ? LEFT HEART CATHETERIZATION WITH CORONARY ANGIOGRAM N/A 08/27/2011  ? Procedure: LEFT HEART CATHETERIZATION WITH CORONARY ANGIOGRAM;  Surgeon: Hillary Bow, MD;  Location: [facility] CATH LAB;  Service: Cardiovascular;  Laterality: N/A;  ? LUMBAR LAMINECTOMY WITH COFLEX 2 LEVEL N/A 01/03/2015  ? Procedure: Lumbar three-four,Lumbar four-five Laminectomy with Coflex;  Surgeon: Kristeen Miss, MD;  Location: [place] NEURO ORS;  Service: Neurosurgery;  Laterality: N/A;  ? TOTAL KNEE ARTHROPLASTY Right 11/16/2013  ? DR Mayer Camel  ? TOTAL KNEE ARTHROPLASTY Right 11/16/2013  ? Procedure: TOTAL KNEE ARTHROPLASTY;  Surgeon: Kerin Salen, MD;   Location: [place];  Service: Orthopedics;  Laterality: Right;  ? TOTAL KNEE ARTHROPLASTY Left 01/12/02  ? ? ?Current Medications: ?Outpatient Medications Prior to Visit  ?Medication Sig Dispense Refill  ? albuterol (PROVENTIL HFA;VENTOLIN HFA) 108 (90 Base) MCG/ACT inhaler Inhale 2 puffs into the lungs every 6 (six) hours as needed for wheezing or shortness of breath.    ? albuterol (PROVENTIL) (2.5 MG/3ML) 0.083% nebulizer solution Take 2.5 mg by nebulization every 6 (six) hours as needed for wheezing or shortness of breath.    ? aspirin EC 325 MG tablet Take 325 mg by mouth daily with lunch.    ? cetirizine (ZYRTEC) 10 MG tablet Take 10 mg by mouth daily.    ? cholecalciferol (VITAMIN D) 1000 UNITS tablet Take 1,000 Units by mouth daily in the afternoon.    ? diclofenac Sodium (VOLTAREN) 1 % GEL Apply 1 application topically 4 (four) times daily as needed (pain).    ? losartan (COZAAR) 100 MG tablet TAKE ONE TABLET BY MOUTH DAILY 90 tablet 1  ? metoprolol tartrate (LOPRESSOR) 25 MG tablet TAKE 1/2 TABLET BY MOUTH TWICE DAILY 90 tablet 1  ? Multiple Vitamin (MULTIVITAMIN) capsule Take 1 capsule by mouth daily in the afternoon.    ? nitroGLYCERIN (NITROSTAT) 0.4 MG SL tablet Place 1 tablet (0.4 mg total) under the tongue every 5 (five) minutes as needed. Chest pain 25 tablet 3  ? omeprazole (PRILOSEC) 40 MG capsule Take 40 mg by mouth daily.    ? simvastatin (ZOCOR) 40 MG tablet TAKE ONE TABLET BY MOUTH AT BEDTIME 90 tablet 1  ? HYDROcodone-acetaminophen (NORCO/VICODIN) 5-325 MG tablet Take 1 tablet by mouth every 6 (six) hours as needed for moderate pain. (Patient not taking: Reported on 01/12/2022)    ? ?No facility-administered medications prior to visit.  ?  ? ?Allergies:   Codeine  ? ?Social History  ? ?Socioeconomic History  ? Marital status: Married  ?  Spouse name: Not on file  ? Number of children: Not on file  ? Years of education: Not on file  ? Highest education level: Not on file  ?Occupational History  ?  Not on file  ?Tobacco Use  ? Smoking status: Former  ?  Types: Cigarettes  ?  Start date: 01/01/1958  ?  Quit date: 09/17/1998  ?  Years since quitting: 23.3  ? Smokeless tobacco: Never  ? Tobacco comments:  ?  quit smoking in 2001  ?Vaping Use  ? Vaping Use: Never used  ?Substance and Sexual Activity  ? Alcohol use: Yes  ?  Alcohol/week: 0.0 standard drinks  ?  Comment: occasionally  wine  ? Drug use: No  ? Sexual activity: Yes  ?Other Topics Concern  ? Not on file  ?Social History Narrative  ? Not on file  ? ?Social Determinants of Health  ? ?Financial Resource Strain: Not on file  ?Food Insecurity: Not on file  ?Transportation Needs: Not on file  ?Physical Activity: Not on file  ?Stress: Not on file  ?Social Connections: Not on file  ?  ? ?Family History:  The patient's family history includes Atrial fibrillation in his sister; Breast cancer in his sister; Diabetes in his mother; Emphysema in his father; Heart attack in his brother, brother, and mother; Heart disease in his brother.  ? ?Review of Systems:   ? ?Please see the history of present illness.    ? ?All other systems reviewed and are otherwise negative except as noted above. ? ? ?Physical Exam:   ? ?VS:  BP 130/80   Pulse 68   Ht '5\' 10"'$  (1.778 m)   Wt 217 lb (98.4 kg)   SpO2 95%   BMI 31.14 kg/m?    ?General: Well developed, well nourished,male appearing in no acute distress. ?Head: Normocephalic, atraumatic. ?Neck: No carotid bruits. JVD not elevated.  ?Lungs: Respirations regular and unlabored, without wheezes or rales.  ?Heart: Regular rate and rhythm. No S3 or S4.  No murmur, no rubs, or gallops appreciated. ?Abdomen: Appears non-distended. No obvious abdominal masses. ?Msk:  Strength and tone appear normal for age. No obvious joint deformities or effusions. ?Extremities: No clubbing or cyanosis. No pitting edema.  Distal pedal pulses are 2+ bilaterally. ?Neuro: Alert and oriented X 3. Moves all extremities spontaneously. No focal deficits  noted. ?Psych:  Responds to questions appropriately with a normal affect. ?Skin: No rashes or lesions noted ? ?Wt Readings from Last 3 Encounters:  ?01/12/22 217 lb (98.4 kg)  ?08/17/21 209 lb 3.2 oz (94.9 kg)  ?11/02/

## 2022-01-12 NOTE — Patient Instructions (Signed)
Medication Instructions:  ?Your physician recommends that you continue on your current medications as directed. Please refer to the Current Medication list given to you today. ? ?*If you need a refill on your cardiac medications before your next appointment, please call your pharmacy* ? ? ?Lab Work: ?NONE  ? ?If you have labs (blood work) drawn today and your tests are completely normal, you will receive your results only by: ?MyChart Message (if you have MyChart) OR ?A paper copy in the mail ?If you have any lab test that is abnormal or we need to change your treatment, we will call you to review the results. ? ? ?Testing/Procedures: ?Your physician has requested that you have a lexiscan myoview. For further information please visit [email or web address]. Please follow instruction sheet, as given. ? ? ? ?Follow-Up: ?At [facility], you and your health needs are our priority.  As part of our continuing mission to provide you with exceptional heart care, we have created designated Provider Care Teams.  These Care Teams include your primary Cardiologist (physician) and Advanced Practice Providers (APPs -  Physician Assistants and Nurse Practitioners) who all work together to provide you with the care you need, when you need it. ? ?We recommend signing up for the patient portal called "MyChart".  Sign up information is provided on this After Visit Summary.  MyChart is used to connect with patients for Virtual Visits (Telemedicine).  Patients are able to view lab/test results, encounter notes, upcoming appointments, etc.  Non-urgent messages can be sent to your provider as well.   ?To learn more about what you can do with MyChart, go to [email or web address].   ? ?Your next appointment:   ? June 2  ? ?The format for your next appointment:   ?In Person ? ?Provider:   ?Carlyle Dolly, MD  ? ? ?Other Instructions ?Thank you for choosing [facility]! ?  ? ? ?Important Information About Sugar ? ? ? ? ?  ?

## 2022-01-16 ENCOUNTER — Ambulatory Visit (HOSPITAL_COMMUNITY)
Admission: RE | Admit: 2022-01-16 | Discharge: 2022-01-16 | Disposition: A | Discharge status: Home / Self Care | Payer: Medicare Other | Source: Ambulatory Visit | Attending: Student | Admitting: Student

## 2022-01-16 ENCOUNTER — Encounter (HOSPITAL_COMMUNITY)
Admission: RE | Admit: 2022-01-16 | Discharge: 2022-01-16 | Disposition: A | Discharge status: Home / Self Care | Payer: Medicare Other | Source: Ambulatory Visit | Attending: Student | Admitting: Student

## 2022-01-16 DIAGNOSIS — R0609 Other forms of dyspnea: Secondary | ICD-10-CM

## 2022-01-16 LAB — NM MYOCAR MULTI W/SPECT W/WALL MOTION / EF
LV dias vol: 82 mL (ref 62–150)
LV sys vol: 30 mL
Nuc Stress EF: 64 %
Peak HR: 82 {beats}/min
RATE: 0.4
Rest HR: 64 {beats}/min
Rest Nuclear Isotope Dose: 11 mCi
SDS: 5
SRS: 2
SSS: 7
ST Depression (mm): 0 mm
Stress Nuclear Isotope Dose: 32.8 mCi
TID: 0.98

## 2022-01-16 MED ORDER — REGADENOSON 0.4 MG/5ML IV SOLN
INTRAVENOUS | Status: AC
  Administered 2022-01-16: 0.4 mg via INTRAVENOUS
  Filled 2022-01-16: qty 5

## 2022-01-16 MED ORDER — SODIUM CHLORIDE FLUSH 0.9 % IV SOLN
INTRAVENOUS | Status: AC
  Administered 2022-01-16: 10 mL via INTRAVENOUS
  Filled 2022-01-16: qty 10

## 2022-01-16 MED ORDER — TECHNETIUM TC 99M TETROFOSMIN IV KIT
30.0000 | PACK | Freq: Once | INTRAVENOUS | Status: AC | PRN
Start: 2022-01-16 — End: 2022-01-16
  Administered 2022-01-16: 32.8 via INTRAVENOUS

## 2022-01-16 MED ORDER — TECHNETIUM TC 99M TETROFOSMIN IV KIT
10.0000 | PACK | Freq: Once | INTRAVENOUS | Status: AC | PRN
  Administered 2022-01-16: 11 via INTRAVENOUS

## 2022-01-18 ENCOUNTER — Encounter (HOSPITAL_COMMUNITY)
Admission: RE | Admit: 2022-01-18 | Discharge: 2022-01-18 | Disposition: A | Discharge status: Home / Self Care | Payer: Medicare Other | Source: Ambulatory Visit | Attending: Gastroenterology | Admitting: Gastroenterology

## 2022-01-19 ENCOUNTER — Other Ambulatory Visit: Payer: Self-pay | Admitting: Cardiology

## 2022-01-23 ENCOUNTER — Encounter (HOSPITAL_COMMUNITY): Payer: Self-pay | Admitting: Gastroenterology

## 2022-01-23 ENCOUNTER — Ambulatory Visit (HOSPITAL_COMMUNITY)
Admission: RE | Admit: 2022-01-23 | Discharge: 2022-01-23 | Disposition: A | Discharge status: Home / Self Care | Payer: Medicare Other | Attending: Gastroenterology | Admitting: Gastroenterology

## 2022-01-23 ENCOUNTER — Ambulatory Visit (HOSPITAL_COMMUNITY): Payer: Medicare Other | Admitting: Anesthesiology

## 2022-01-23 ENCOUNTER — Ambulatory Visit (HOSPITAL_BASED_OUTPATIENT_CLINIC_OR_DEPARTMENT_OTHER): Payer: Medicare Other | Admitting: Anesthesiology

## 2022-01-23 ENCOUNTER — Encounter (HOSPITAL_COMMUNITY)
Admission: RE | Disposition: A | Discharge status: Home / Self Care | Payer: Self-pay | Source: Home / Self Care | Attending: Gastroenterology

## 2022-01-23 DIAGNOSIS — K635 Polyp of colon: Secondary | ICD-10-CM

## 2022-01-23 DIAGNOSIS — Z1211 Encounter for screening for malignant neoplasm of colon: Secondary | ICD-10-CM | POA: Insufficient documentation

## 2022-01-23 DIAGNOSIS — J449 Chronic obstructive pulmonary disease, unspecified: Secondary | ICD-10-CM | POA: Diagnosis not present

## 2022-01-23 DIAGNOSIS — I2510 Atherosclerotic heart disease of native coronary artery without angina pectoris: Secondary | ICD-10-CM | POA: Diagnosis not present

## 2022-01-23 DIAGNOSIS — I252 Old myocardial infarction: Secondary | ICD-10-CM | POA: Diagnosis not present

## 2022-01-23 DIAGNOSIS — Z87891 Personal history of nicotine dependence: Secondary | ICD-10-CM | POA: Insufficient documentation

## 2022-01-23 DIAGNOSIS — K644 Residual hemorrhoidal skin tags: Secondary | ICD-10-CM | POA: Diagnosis not present

## 2022-01-23 DIAGNOSIS — Z86010 Personal history of colonic polyps: Secondary | ICD-10-CM | POA: Diagnosis not present

## 2022-01-23 DIAGNOSIS — K648 Other hemorrhoids: Secondary | ICD-10-CM | POA: Diagnosis not present

## 2022-01-23 DIAGNOSIS — Z09 Encounter for follow-up examination after completed treatment for conditions other than malignant neoplasm: Secondary | ICD-10-CM

## 2022-01-23 DIAGNOSIS — D123 Benign neoplasm of transverse colon: Secondary | ICD-10-CM | POA: Insufficient documentation

## 2022-01-23 DIAGNOSIS — K5730 Diverticulosis of large intestine without perforation or abscess without bleeding: Secondary | ICD-10-CM

## 2022-01-23 DIAGNOSIS — K219 Gastro-esophageal reflux disease without esophagitis: Secondary | ICD-10-CM | POA: Diagnosis not present

## 2022-01-23 DIAGNOSIS — I10 Essential (primary) hypertension: Secondary | ICD-10-CM | POA: Diagnosis not present

## 2022-01-23 HISTORY — PX: COLONOSCOPY WITH PROPOFOL: SHX5780

## 2022-01-23 HISTORY — PX: POLYPECTOMY: SHX5525

## 2022-01-23 LAB — HM COLONOSCOPY

## 2022-01-23 SURGERY — COLONOSCOPY WITH PROPOFOL
Anesthesia: General

## 2022-01-23 MED ORDER — PROPOFOL 10 MG/ML IV BOLUS
INTRAVENOUS | Status: AC
  Filled 2022-01-23: qty 20

## 2022-01-23 MED ORDER — PROPOFOL 10 MG/ML IV BOLUS
INTRAVENOUS | Status: DC | PRN
  Administered 2022-01-23: 25 mg via INTRAVENOUS
  Administered 2022-01-23: 50 mg via INTRAVENOUS

## 2022-01-23 MED ORDER — PROPOFOL 500 MG/50ML IV EMUL
INTRAVENOUS | Status: DC | PRN
  Administered 2022-01-23: 150 ug/kg/min via INTRAVENOUS

## 2022-01-23 MED ORDER — LACTATED RINGERS IV SOLN: INTRAVENOUS | Status: DC | PRN

## 2022-01-23 MED ORDER — LIDOCAINE HCL 1 % IJ SOLN
INTRAMUSCULAR | Status: DC | PRN
  Administered 2022-01-23: 50 mg via INTRADERMAL

## 2022-01-23 NOTE — Discharge Instructions (Addendum)
You are being discharged to home.  ?Resume your previous diet.  ?We are waiting for your pathology results.  ?Your physician has recommended a repeat colonoscopy for surveillance based on pathology results.  ?Restart aspirin today. ?

## 2022-01-23 NOTE — Op Note (Addendum)
[facility] ?Patient Name: [name] [name] ?Procedure Date: 01/23/2022 9:56 AM ?MRN: [id number] ?Date of Birth: 14-Jul-1946 ?Attending MD: Maylon Peppers ,  ?CSN: [id number] ?Age: 76 ?Admit Type: Outpatient ?Procedure:                Colonoscopy ?Indications:              Surveillance: Personal history of colonic polyps  ?                          (unknown histology) on last colonoscopy more than 5  ?                          years ago ?Providers:                Maylon Peppers, Caprice Kluver, Rosina Lowenstein, RN ?Referring MD:              ?Medicines:                Monitored Anesthesia Care ?Complications:            No immediate complications. ?Estimated Blood Loss:     Estimated blood loss: none. ?Procedure:                Pre-Anesthesia Assessment: ?                          - Prior to the procedure, a History and Physical  ?                          was performed, and patient medications, allergies  ?                          and sensitivities were reviewed. The patient's  ?                          tolerance of previous anesthesia was reviewed. ?                          - The risks and benefits of the procedure and the  ?                          sedation options and risks were discussed with the  ?                          patient. All questions were answered and informed  ?                          consent was obtained. ?                          - ASA Grade Assessment: II - A patient with mild  ?                          systemic disease. ?                          After obtaining informed consent, the colonoscope  ?  was passed under direct vision. Throughout the  ?                          procedure, the patient's blood pressure, pulse, and  ?                          oxygen saturations were monitored continuously. The  ?                          PCF-HQ190L ([id number]) scope was introduced through  ?                          the anus and advanced to the the terminal ileum.  ?                           The colonoscopy was performed without difficulty.  ?                          The patient tolerated the procedure well. The  ?                          quality of the bowel preparation was good. ?Scope In: 10:06:55 AM ?Scope Out: 10:23:11 AM ?Scope Withdrawal Time: 0 hours 13 minutes 23 seconds  ?Total Procedure Duration: 0 hours 16 minutes 16 seconds  ?Findings: ?     Hemorrhoids were found on perianal exam. ?     Three sessile polyps were found in the transverse colon. The polyps were  ?     3 to 4 mm in size. These polyps were removed with a cold snare.  ?     Resection and retrieval were complete. ?     A few large-mouthed diverticula were found in the transverse colon and  ?     ascending colon. ?     Non-bleeding external internal hemorrhoids were found during  ?     retroflexion. The hemorrhoids were medium-sized. ?     The terminal ileum appeared normal. ?Impression:               - Hemorrhoids found on perianal exam. ?                          - Three 3 to 4 mm polyps in the transverse colon,  ?                          removed with a cold snare. Resected and retrieved. ?                          - Diverticulosis in the transverse colon and in the  ?                          ascending colon. ?                          - Non-bleeding external internal hemorrhoids. ?                          -  The examined portion of the ileum was normal. ?Moderate Sedation: ?     Per Anesthesia Care ?Recommendation:           - Discharge patient to home (ambulatory). ?                          - Resume previous diet. ?                          - Await pathology results. ?                          - Repeat colonoscopy for surveillance based on  ?                          pathology results. ?Procedure Code(s):        --- Professional --- ?                          [phone], Colonoscopy, flexible; with removal of  ?                          tumor(s), polyp(s), or other lesion(s) by snare  ?                           technique ?Diagnosis Code(s):        --- Professional --- ?                          Z86.010, Personal history of colonic polyps ?                          K64.4, Residual hemorrhoidal skin tags ?                          K64.8, Other hemorrhoids ?                          K63.5, Polyp of colon ?                          K57.30, Diverticulosis of large intestine without  ?                          perforation or abscess without bleeding ?CPT copyright 2019 [employer] Medical Association. All rights reserved. ?The codes documented in this report are preliminary and upon coder review may  ?be revised to meet current compliance requirements. ?Maylon Peppers, MD ?Maylon Peppers,  ?01/23/2022 10:29:17 AM ?This report has been signed electronically. ?Number of Addenda: 0 ?

## 2022-01-23 NOTE — H&P (Signed)
[name] [name] is an 76 y.o. male.   ?Chief Complaint: history colon polyps ?HPI: 76 year old male with past medical history of asthma, coronary artery disease, diverticulosis, GERD, hyperlipidemia, hypertension, Paget's disease, coming for history of colonic polyps.  Patient reported he last colonoscopy was performed 10 years ago and had 2 polyps removed, no reports are available.  The patient denies having any complaints such as melena, hematochezia, abdominal pain or distention, change in her bowel movement consistency or frequency, no changes in her weight recently.  No family history of colorectal cancer. ? ? ?Past Medical History:  ?Diagnosis Date  ? Anemia   ? Arthritis   ? Asthma   ? with exertion  ? Back pain   ? CAD (coronary artery disease)   ? a. s/p STEMI in 2004 with stenting to LAD and LCx b. low-risk NST in 2015  ? Cataracts, bilateral   ? immature  ? Colitis   ? just finished one antibiotic and Cipro will be finished up  ? COPD (chronic obstructive pulmonary disease) ([place])   ? no meds required  ? Coronary artery disease   ? Diverticulosis   ? GERD (gastroesophageal reflux disease)   ? takes Omeprazole daily  ? Hemorrhoids   ? History of bronchitis   ? many yrs ago  ? History of colon polyps   ? Hyperlipidemia   ? takes Simvastatin nightly  ? Hypertension   ? takes Losartan and Metoprolol daily  ? Joint pain   ? Myocardial infarct ([place]) 09/17/2002  ? Paget disease of bone   ? takes Fosamax daily  ? Shortness of breath   ? URI (upper respiratory infection)   ? Dec 31,2014  ? Vertigo   ? takes Meclizine daily as needed  ? ? ?Past Surgical History:  ?Procedure Laterality Date  ? ABDOMINAL EXPLORATION SURGERY    ? APPENDECTOMY    ? BACK SURGERY    ? x 2  ? bilateral knee arthroscopies    ? x 3  ? CARPAL TUNNEL RELEASE Right   ? COLONOSCOPY    ? CORONARY ANGIOPLASTY    ? 3 stents  ? DENTAL SURGERY    ? ESOPHAGOGASTRODUODENOSCOPY    ? HERNIA REPAIR    ? inguinal  ? JOINT REPLACEMENT Left   ? LEFT HEART  CATHETERIZATION WITH CORONARY ANGIOGRAM N/A 08/27/2011  ? Procedure: LEFT HEART CATHETERIZATION WITH CORONARY ANGIOGRAM;  Surgeon: Hillary Bow, MD;  [facility] C[facility] LAB;  Service: Cardiovascular;  Laterality: N/A;  ? LUMBAR LAMINECTOMY WITH COFLEX 2 LEVEL N/A 01/03/2015  ? Procedure: Lumbar three-four,Lumbar four-five Laminectomy with Coflex;  Surgeon: Kristeen Miss, MD;  Location: [place] NEURO ORS;  Service: Neurosurgery;  Laterality: N/A;  ? TOTAL KNEE ARTHROPLASTY Right 11/16/2013  ? DR Mayer Camel  ? TOTAL KNEE ARTHROPLASTY Right 11/16/2013  ? Procedure: TOTAL KNEE ARTHROPLASTY;  Surgeon: Kerin Salen, MD;  Location: [place];  Service: Orthopedics;  Laterality: Right;  ? TOTAL KNEE ARTHROPLASTY Left 01/12/02  ? ? ?Family History  ?Problem Relation Age of Onset  ? Heart attack Mother   ? Diabetes Mother   ? Emphysema Father   ? Atrial fibrillation Sister   ? Heart attack Brother   ? Heart attack Brother   ? Heart disease Brother   ? Breast cancer Sister   ? ?Social History:  reports that he quit smoking about 23 years ago. His smoking use included cigarettes. He started smoking about 64 years ago. He has never used smokeless  tobacco. He reports current alcohol use. He reports that he does not use drugs. ? ?Allergies:  ?Allergies  ?Allergen Reactions  ? Codeine Nausea And Vomiting  ? ? ?Medications Prior to Admission  ?Medication Sig Dispense Refill  ? acetaminophen (TYLENOL) 325 MG tablet Take 650 mg by mouth every 6 (six) hours as needed for moderate pain.    ? albuterol (PROVENTIL) (2.5 MG/3ML) 0.083% nebulizer solution Take 2.5 mg by nebulization every 6 (six) hours as needed for wheezing or shortness of breath.    ? aspirin EC 325 MG tablet Take 325 mg by mouth daily with lunch.    ? cetirizine (ZYRTEC) 10 MG tablet Take 10 mg by mouth daily.    ? cholecalciferol (VITAMIN D) 1000 UNITS tablet Take 1,000 Units by mouth daily in the afternoon.    ? diclofenac Sodium (VOLTAREN) 1 % GEL Apply 1 application topically 4  (four) times daily as needed (pain).    ? losartan (COZAAR) 100 MG tablet TAKE ONE TABLET BY MOUTH DAILY 90 tablet 1  ? metoprolol tartrate (LOPRESSOR) 25 MG tablet TAKE 1/2 TABLET BY MOUTH TWICE DAILY 90 tablet 1  ? Multiple Vitamin (MULTIVITAMIN) capsule Take 1 capsule by mouth daily in the afternoon.    ? nitroGLYCERIN (NITROSTAT) 0.4 MG SL tablet Place 1 tablet (0.4 mg total) under the tongue every 5 (five) minutes as needed. Chest pain 25 tablet 3  ? omeprazole (PRILOSEC) 40 MG capsule Take 40 mg by mouth daily.    ? simvastatin (ZOCOR) 40 MG tablet TAKE ONE TABLET BY MOUTH AT BEDTIME 90 tablet 1  ? albuterol (PROVENTIL HFA;VENTOLIN HFA) 108 (90 Base) MCG/ACT inhaler Inhale 2 puffs into the lungs every 6 (six) hours as needed for wheezing or shortness of breath.    ? ? ?No results found for this or any previous visit (from the past 48 hour(s)). ?No results found. ? ?Review of Systems  ?Constitutional: Negative.   ?HENT: Negative.    ?Eyes: Negative.   ?Respiratory: Negative.    ?Cardiovascular: Negative.   ?Gastrointestinal: Negative.   ?Endocrine: Negative.   ?Genitourinary: Negative.   ?Musculoskeletal: Negative.   ?Skin: Negative.   ?Allergic/Immunologic: Negative.   ?Neurological: Negative.   ?Hematological: Negative.   ?Psychiatric/Behavioral: Negative.    ? ?Blood pressure 135/90, pulse 60, temperature 97.6 ?F (36.4 ?C), temperature source Oral, resp. rate 13, height '5\' 10"'$  (1.778 m), weight 95.3 kg, SpO2 93 %. ?Physical Exam  ?GENERAL: The patient is AO x3, in no acute distress. ?HEENT: Head is normocephalic and atraumatic. EOMI are intact. Mouth is well hydrated and without lesions. ?NECK: Supple. No masses ?LUNGS: Clear to auscultation. No presence of rhonchi/wheezing/rales. Adequate chest expansion ?HEART: RRR, normal s1 and s2. ?ABDOMEN: Soft, nontender, no guarding, no peritoneal signs, and nondistended. BS +. No masses. ?EXTREMITIES: Without any cyanosis, clubbing, rash, lesions or  edema. ?NEUROLOGIC: AOx3, no focal motor deficit. ?SKIN: no jaundice, no rashes ? ?Assessment/Plan ?76 year old male with past medical history of asthma, coronary artery disease, diverticulosis, GERD, hyperlipidemia, hypertension, Paget's disease, coming for history of colonic polyps. We will proceed with colonoscopy. ? ?Harvel Quale, MD ?01/23/2022, 9:31 AM ? ? ? ?

## 2022-01-23 NOTE — Transfer of Care (Signed)
Immediate Anesthesia Transfer of Care Note ? ?Patient: [name] [name] ? ?Procedure(s) Performed: COLONOSCOPY WITH PROPOFOL ?POLYPECTOMY ? ?Patient Location: Short Stay ? ?Anesthesia Type:General ? ?Level of Consciousness: awake ? ?Airway & Oxygen Therapy: Patient Spontanous Breathing ? ?Post-op Assessment: Report given to RN ? ?Post vital signs: Reviewed and stable ? ?Last Vitals:  ?Vitals Value Taken Time  ?BP    ?Temp    ?Pulse    ?Resp    ?SpO2    ? ? ?Last Pain:  ?Vitals:  ? 01/23/22 1001  ?TempSrc:   ?PainSc: 0-No pain  ?   ? ?  ? ?Complications: No notable events documented. ?

## 2022-01-23 NOTE — Anesthesia Preprocedure Evaluation (Signed)
Anesthesia Evaluation  ?Patient identified by MRN, date of birth, ID band ?Patient awake ? ? ? ?Reviewed: ?Allergy & Precautions, H&P , NPO status , Patient's Chart, lab work & pertinent test results, reviewed documented beta blocker date and time  ? ?Airway ?Mallampati: II ? ?TM Distance: >3 FB ?Neck ROM: full ? ? ? Dental ?no notable dental hx. ? ?  ?Pulmonary ?shortness of breath, asthma , COPD, former smoker,  ?  ?Pulmonary exam normal ?breath sounds clear to auscultation ? ? ? ? ? ? Cardiovascular ?Exercise Tolerance: Good ?hypertension, + CAD and + Past MI  ? ?Rhythm:regular Rate:Normal ? ? ?  ?Neuro/Psych ?negative neurological ROS ? negative psych ROS  ? GI/Hepatic ?Neg liver ROS, GERD  Medicated,  ?Endo/Other  ?negative endocrine ROS ? Renal/GU ?negative Renal ROS  ?negative genitourinary ?  ?Musculoskeletal ? ? Abdominal ?  ?Peds ? Hematology ? ?(+) Blood dyscrasia, anemia ,   ?Anesthesia Other Findings ? ? Reproductive/Obstetrics ?negative OB ROS ? ?  ? ? ? ? ? ? ? ? ? ? ? ? ? ?  ?  ? ? ? ? ? ? ? ? ?Anesthesia Physical ?Anesthesia Plan ? ?ASA: 3 ? ?Anesthesia Plan: General  ? ?Post-op Pain Management:   ? ?Induction:  ? ?PONV Risk Score and Plan: Propofol infusion ? ?Airway Management Planned:  ? ?Additional Equipment:  ? ?Intra-op Plan:  ? ?Post-operative Plan:  ? ?Informed Consent: I have reviewed the patients History and Physical, chart, labs and discussed the procedure including the risks, benefits and alternatives for the proposed anesthesia with the patient or authorized representative who has indicated his/her understanding and acceptance.  ? ? ? ?Dental Advisory Given ? ?Plan Discussed with: CRNA ? ?Anesthesia Plan Comments:   ? ? ? ? ? ? ?Anesthesia Quick Evaluation ? ?

## 2022-01-23 NOTE — Anesthesia Postprocedure Evaluation (Signed)
Anesthesia Post Note ? ?Patient: [name] [name] ? ?Procedure(s) Performed: COLONOSCOPY WITH PROPOFOL ?POLYPECTOMY ? ?Patient location during evaluation: Short Stay ?Anesthesia Type: General ?Level of consciousness: awake and alert ?Pain management: pain level controlled ?Vital Signs Assessment: post-procedure vital signs reviewed and stable ?Respiratory status: spontaneous breathing ?Cardiovascular status: blood pressure returned to baseline and stable ?Postop Assessment: no apparent nausea or vomiting ?Anesthetic complications: no ? ? ?No notable events documented. ? ? ?Last Vitals:  ?Vitals:  ? 01/23/22 0913 01/23/22 1028  ?BP: 135/90 91/65  ?Pulse: 60 65  ?Resp: 13 16  ?Temp: 36.4 ?C (!) 36.4 ?C  ?SpO2: 93% 93%  ?  ?Last Pain:  ?Vitals:  ? 01/23/22 1028  ?TempSrc: Oral  ?PainSc: 0-No pain  ? ? ?  ?  ?  ?  ?  ?  ? ?Breelyn Icard ? ? ? ? ?

## 2022-01-24 ENCOUNTER — Encounter (INDEPENDENT_AMBULATORY_CARE_PROVIDER_SITE_OTHER): Payer: Self-pay | Admitting: *Deleted

## 2022-01-24 LAB — SURGICAL PATHOLOGY

## 2022-01-30 ENCOUNTER — Encounter (HOSPITAL_COMMUNITY): Payer: Self-pay | Admitting: Gastroenterology

## 2022-02-16 ENCOUNTER — Encounter: Payer: Self-pay | Admitting: Cardiology

## 2022-02-16 ENCOUNTER — Ambulatory Visit: Payer: Medicare Other | Admitting: Cardiology

## 2022-02-16 VITALS — BP 122/62 | HR 69 | Ht 70.0 in | Wt 216.4 lb

## 2022-02-16 DIAGNOSIS — I2510 Atherosclerotic heart disease of native coronary artery without angina pectoris: Secondary | ICD-10-CM | POA: Diagnosis not present

## 2022-02-16 DIAGNOSIS — E782 Mixed hyperlipidemia: Secondary | ICD-10-CM | POA: Diagnosis not present

## 2022-02-16 DIAGNOSIS — I10 Essential (primary) hypertension: Secondary | ICD-10-CM | POA: Diagnosis not present

## 2022-02-16 NOTE — Patient Instructions (Addendum)
Medication Instructions:  Continue all current medications.   Labwork: none  Testing/Procedures: none  Follow-Up: 6 months   Any Other Special Instructions Will Be Listed Below (If Applicable).   If you need a refill on your cardiac medications before your next appointment, please call your pharmacy.  

## 2022-02-16 NOTE — Progress Notes (Signed)
Clinical Summary [name] [name] is a 76 y.o.male seen today for follow up of the following medical problems.    1. CAD   - prior STEMI in 2004, received stent to LAD and LCX   - 08/2011 Echo LVEF 50-55%   - Jan 2015 Lexiscan MPI no ischemia     -12/2021 visit with PA Ahmed Prima reported some recent DOE - 01/2022 nuclear stress: inferior infarct, no current ischemia.  -no recent chest pains. Breathing improved since last visit. He was COVID + 2/13 he attributes really knocked him down.      2. Hyperlipidemia - followed by pcp  -Reports tried on more potent statins but caused muscle aches. Tolerating simva '40mg'$  daily well.      08/2019 TC 139 TG 130 HDL 38 LDL 78 -10/2020 TC 123 TG 108 HDL 35 LDL 66 - compliant with meds   3. HTN - he is compliant with meds - home bp's are at goal     4. COPD/Tobacco history - abnormal PFTs 06/2017, managed by pcp - 07/2018 no AAA   5. Subdural hematoma - 07/2020 SDH after fall    6. 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  He was on daily aspirin, Plavix was added and statin was continued.  - neurology stopped plavix and started high dose aspirin Past Medical History:  Diagnosis Date   Anemia    Arthritis    Asthma    with exertion   Back pain    CAD (coronary artery disease)    a. s/p STEMI in 2004 with stenting to LAD and LCx b. low-risk NST in 2015   Cataracts, bilateral    immature   Colitis    just finished one antibiotic and Cipro will be finished up   COPD (chronic obstructive pulmonary disease) ([place])    no meds required   Coronary artery disease    Diverticulosis    GERD (gastroesophageal reflux disease)    takes Omeprazole daily   Hemorrhoids    History of bronchitis    many yrs ago   History of colon polyps    Hyperlipidemia    takes Simvastatin nightly   Hypertension    takes Losartan and Metoprolol daily    Joint pain    Myocardial infarct ([place]) 09/17/2002   Paget disease of bone    takes Fosamax daily   Shortness of breath    URI (upper respiratory infection)    Dec 31,2014   Vertigo    takes Meclizine daily as needed     Allergies  Allergen Reactions   Codeine Nausea And Vomiting     Current Outpatient Medications  Medication Sig Dispense Refill   acetaminophen (TYLENOL) 325 MG tablet Take 650 mg by mouth every 6 (six) hours as needed for moderate pain.     albuterol (PROVENTIL HFA;VENTOLIN HFA) 108 (90 Base) MCG/ACT inhaler Inhale 2 puffs into the lungs every 6 (six) hours as needed for wheezing or shortness of breath.     albuterol (PROVENTIL) (2.5 MG/3ML) 0.083% nebulizer solution Take 2.5 mg by nebulization every 6 (six) hours as needed for wheezing or shortness of breath.     aspirin EC 325 MG tablet Take 325 mg by mouth daily with lunch.     cetirizine (ZYRTEC) 10 MG tablet Take 10 mg by mouth daily.     cholecalciferol (VITAMIN  D) 1000 UNITS tablet Take 1,000 Units by mouth daily in the afternoon.     diclofenac Sodium (VOLTAREN) 1 % GEL Apply 1 application topically 4 (four) times daily as needed (pain).     losartan (COZAAR) 100 MG tablet TAKE ONE TABLET BY MOUTH DAILY 90 tablet 1   metoprolol tartrate (LOPRESSOR) 25 MG tablet TAKE 1/2 TABLET BY MOUTH TWICE DAILY 90 tablet 1   Multiple Vitamin (MULTIVITAMIN) capsule Take 1 capsule by mouth daily in the afternoon.     nitroGLYCERIN (NITROSTAT) 0.4 MG SL tablet Place 1 tablet (0.4 mg total) under the tongue every 5 (five) minutes as needed. Chest pain 25 tablet 3   omeprazole (PRILOSEC) 40 MG capsule Take 40 mg by mouth daily.     simvastatin (ZOCOR) 40 MG tablet TAKE ONE TABLET BY MOUTH AT BEDTIME 90 tablet 1   No current facility-administered medications for this visit.     Past Surgical History:  Procedure Laterality Date   ABDOMINAL EXPLORATION SURGERY     APPENDECTOMY     BACK SURGERY     x 2   bilateral knee  arthroscopies     x 3   CARPAL TUNNEL RELEASE Right    COLONOSCOPY     COLONOSCOPY WITH PROPOFOL N/A 01/23/2022   Procedure: COLONOSCOPY WITH PROPOFOL;  Surgeon: Harvel Quale, MD;  Location: [facility];  Service: Gastroenterology;  Laterality: N/A;  10:10 ASA 2   CORONARY ANGIOPLASTY     3 stents   DENTAL SURGERY     ESOPHAGOGASTRODUODENOSCOPY     HERNIA REPAIR     inguinal   JOINT REPLACEMENT Left    LEFT HEART CATHETERIZATION WITH CORONARY ANGIOGRAM N/A 08/27/2011   Procedure: LEFT HEART CATHETERIZATION WITH CORONARY ANGIOGRAM;  Surgeon: Hillary Bow, MD;  Location[facility] CATH LAB;  Service: Cardiovascular;  Laterality: N/A;   LUMBAR LAMINECTOMY WITH COFLEX 2 LEVEL N/A 01/03/2015   Procedure: Lumbar three-four,Lumbar four-five Laminectomy with Coflex;  Surgeon: Kristeen Miss, MD;  Location: [place] NEURO ORS;  Service: Neurosurgery;  Laterality: N/A;   POLYPECTOMY  01/23/2022   Procedure: POLYPECTOMY;  Surgeon: Harvel Quale, MD;  Location: [facility];  Service: Gastroenterology;;   TOTAL KNEE ARTHROPLASTY Right 11/16/2013   DR Mayer Camel   TOTAL KNEE ARTHROPLASTY Right 11/16/2013   Procedure: TOTAL KNEE ARTHROPLASTY;  Surgeon: Kerin Salen, MD;  Location: [place];  Service: Orthopedics;  Laterality: Right;   TOTAL KNEE ARTHROPLASTY Left 01/12/02     Allergies  Allergen Reactions   Codeine Nausea And Vomiting      Family History  Problem Relation Age of Onset   Heart attack Mother    Diabetes Mother    Emphysema Father    Atrial fibrillation Sister    Heart attack Brother    Heart attack Brother    Heart disease Brother    Breast cancer Sister      Social History Mr. Davie reports that he quit smoking about 23 years ago. His smoking use included cigarettes. He started smoking about 64 years ago. He has never used smokeless tobacco. Mr. Ruscitti reports current alcohol use.   Review of Systems CONSTITUTIONAL: No weight loss, fever, chills, weakness or  fatigue.  HEENT: Eyes: No visual loss, blurred vision, double vision or yellow sclerae.No hearing loss, sneezing, congestion, runny nose or sore throat.  SKIN: No rash or itching.  CARDIOVASCULAR: per hpi RESPIRATORY: No shortness of breath, cough or sputum.  GASTROINTESTINAL: No anorexia, nausea, vomiting or  diarrhea. No abdominal pain or blood.  GENITOURINARY: No burning on urination, no polyuria NEUROLOGICAL: No headache, dizziness, syncope, paralysis, ataxia, numbness or tingling in the extremities. No change in bowel or bladder control.  MUSCULOSKELETAL: No muscle, back pain, joint pain or stiffness.  LYMPHATICS: No enlarged nodes. No history of splenectomy.  PSYCHIATRIC: No history of depression or anxiety.  ENDOCRINOLOGIC: No reports of sweating, cold or heat intolerance. No polyuria or polydipsia.  Marland Kitchen   Physical Examination Today's Vitals   02/16/22 1249  BP: 122/62  Pulse: 69  SpO2: 93%  Weight: 216 lb 6.4 oz (98.2 kg)  Height: '5\' 10"'$  (1.778 m)   Body mass index is 31.05 kg/m.  Gen: resting comfortably, no acute distress HEENT: no scleral icterus, pupils equal round and reactive, no palptable cervical adenopathy,  CV: RRR, no m/r/g no jvd Resp: Clear to auscultation bilaterally GI: abdomen is soft, non-tender, non-distended, normal bowel sounds, no hepatosplenomegaly MSK: extremities are warm, no edema.  Skin: warm, no rash Neuro:  no focal deficits Psych: appropriate affect   Diagnostic Studies  08/2003 Cath   FINDINGS:   1. Left main trunk: Large caliber vessel with mild irregularities.   2. LAD: This begins as a large caliber vessel and tapers in the distal   section. Provides two diagonal branches. The proximal LAD has mild   disease of 30%. The mid LAD has eccentric plaque of 50%. The distal LAD   has a high grade narrowing of 95% medially after the second diagonal   Lakesa Coste with TIMI 2 flow distally. The first diagonal Clarivel Callaway has an   ostial narrowing of  50-60%. The second diagonal Tejay Hubert has an ostial   narrowing of 30-40% and is a large caliber vessel.   3. Left circumflex artery: This is a medium caliber vessel. Provides   trivial first marginal Kelisha Dall in the mid section and two marginal   branches distally. The proximal AV circumflex has high grade narrowing   of 70-80%. There is then further narrowing of 50-60% after the first   marginal Cuma Polyakov. The first marginal Joncarlos Atkison has long tubular narrowing of   50% in the proximal segment. The second marginal Maree Ainley has an ostial   narrowing of 80%. The third marginal Aneri Slagel has proximal narrowing of   70%.   4. Right coronary artery: Dominant. This is a large caliber vessel.   Provides the posterior descending artery and posterior ventricular Haylynn Pha   in terminal segment. The right coronary artery has diffuse disease of 30-   40% in the mid and distal section.   5. LV: Normal end-systolic and end-diastolic dimensions. Overall left   ventricular function is well preserved. Ejection fraction greater than   55%. No mitral regurgitation. There is akinesis of a small portion of   the mid anterior wall. LV pressure is 110/10. Aortic is 110/60. LVEDP   equals 15.   DESCRIPTION OF PROCEDURE: With these findings we elected to proceed with   percutaneous intervention to the distal LAD. The patient was given heparin   systemically as well as Reopro to maintain an ACT of approximately 300   seconds using 600 mg of Plavix. During the case he developed some itching   and was given Benadryl and Solu-Medrol with improvement of this. A 6-French   Q4 guide catheter was used to engage the left coronary artery and a 0.014   inch Asahi Prowater wire introduced. This was used to cross the stenosis   and position the distal  LAD. A 2.5 x 15 mm Voyager balloon was introduced   and used to dilate the distal LAD lesion at 6 atmospheres for 120 seconds.   Repeat angiography showed an improvement in vessel lumen.  However, there   was severe residual plaque and it was felt that further intervention would   be required. We introduced a 2.75 x 15 mm cutting balloon and a total of   six inflations were performed within the distal LAD at up to 10 atmospheres   for 60 seconds. Repeat angiography showed modest improvement in vessel   lumen. However, in the LAO cranial views there still appeared to be   residual narrowing of 70-80% at the origin of the stenosis. Some plaque   shifting had occurred into the diagonal Lester Platas as well. While we tried to   avoid stent placement, this became unavoidable. We positioned a 2.5 x 18 mm   Cypher stent into the distal LAD and deployed this at 10 atmospheres for 30   seconds. A 2.75 x 15 mm Quantum Maverick balloon was then used to post   dilate the stent. Two inflations were performed up to 14 atmospheres for 30   seconds each. Repeat angiography was then performed after the   administration of intracoronary nitroglycerin showing excellent result with   no residual stenosis in the distal LAD and improvement of TIMI grade 2 to   TIMI grade 3 flow in the distal LAD. There was some plaque shift to the   origin of the second diagonal Makalya Nave of 70-80%. However, there was still   TIMI 3 flow in this vessel. We decided to accept this result. Final   angiography was performed in various projections showing no distal vessel   damage. The guide catheter was then removed. An AngioSeal closure device   was then deployed to the right femoral artery due to mild oozing around the   sheath. There was persistence of oozing and a FemStop was positioned until   hemostasis was achieved. The patient then transferred to the CCU in stable   condition. He tolerated the procedure well.   FINAL RESULTS: Successful PTCA and stent placement in the distal LAD with   reduction of 95% narrowing to 0% with placement of a 2.5 x 18 mm Cypher drug-   eluting stent dilated to 2.75 mm and improvement of  TIMI grade 2 to TIMI   grade 3 flow.     08/2011 Echo   LVEF 19-62%, grade I diastolic dysfunction, difficult study.     09/04/13 EKG   Normal sinus rhythm, no ischemic changes     Jan 2015 Stress MPI IMPRESSION: Low risk Lexiscan Cardiolite. No diagnostic ST segment changes were noted. Rare PACs observed. Perfusion imaging is consistent with diaphragmatic attenuation affecting the inferior wall, no clear evidence of scar or ischemia. LV volumes are normal and LVEF is 62% without wall motion abnormality.   09/25/14 Clinic EKG SR with PACs     06/2017 PFTs Mild ventilatory defect, +air trapping, moderately reduced DLCO       Assessment and Plan   1. CAD   -no symptmos,recent stress test was benign - continue current meds        2 . Hyperlipidemia - reports muscle aches on more potent statins, has tolerated simvastatin - reasonable control given statin limitations, continue current meds   3. HTN He is at goal, continue current meds   F/u 6 months     Arnoldo Lenis, M.D.

## 2022-04-24 ENCOUNTER — Other Ambulatory Visit: Payer: Self-pay | Admitting: Cardiology

## 2022-07-25 ENCOUNTER — Other Ambulatory Visit: Payer: Self-pay | Admitting: Cardiology

## 2022-09-03 ENCOUNTER — Encounter: Payer: Self-pay | Admitting: Cardiology

## 2022-09-03 ENCOUNTER — Ambulatory Visit: Payer: Medicare Other | Attending: Cardiology | Admitting: Cardiology

## 2022-09-03 VITALS — BP 120/74 | HR 78 | Ht 70.0 in | Wt 208.4 lb

## 2022-09-03 DIAGNOSIS — E782 Mixed hyperlipidemia: Secondary | ICD-10-CM

## 2022-09-03 DIAGNOSIS — I10 Essential (primary) hypertension: Secondary | ICD-10-CM | POA: Diagnosis not present

## 2022-09-03 DIAGNOSIS — R42 Dizziness and giddiness: Secondary | ICD-10-CM

## 2022-09-03 DIAGNOSIS — I2510 Atherosclerotic heart disease of native coronary artery without angina pectoris: Secondary | ICD-10-CM

## 2022-09-03 NOTE — Patient Instructions (Addendum)
Medication Instructions:  Continue all current medications.  Labwork: none  Testing/Procedures: none  Follow-Up:  6 mo   Any Other Special Instructions Will Be Listed Below (If Applicable). Please update the office with BP readings in 1 week.    If you need a refill on your cardiac medications before your next appointment, please call your pharmacy.

## 2022-09-03 NOTE — Progress Notes (Signed)
Clinical Summary [name] [name] is a 76 y.o.male seen today for follow up of the following medical problems.      1. CAD   - prior STEMI in 2004, received stent to LAD and LCX   - 08/2011 Echo LVEF 50-55%   - Jan 2015 Lexiscan MPI no ischemia     -12/2021 visit with [name] [name] reported some recent DOE - 01/2022 nuclear stress: inferior infarct, no current ischemia.  -no recent chest pains. Breathing improved since last visit. He was COVID + 2/13 he attributes really knocked him down.     - chest pain while sleeping, few minutes. Sharp pain. Not positional. No other associated symptoms. Had large dinner that night - no exertional symptoms, remains very active     2. Hyperlipidemia - followed by pcp  -Reports tried on more potent statins but caused muscle aches. Tolerating simva '40mg'$  daily well.      08/2019 TC 139 TG 130 HDL 38 LDL 78 -10/2020 TC 123 TG 108 HDL 35 LDL 66 - compliant with meds - labs followed by pcp, upcoming labs   3. HTN - compliant with meds - home bp's elevated but was on prednisone at the time for recent      4. COPD/Tobacco history - abnormal PFTs 06/2017, managed by pcp - 07/2018 no AAA   5. Subdural hematoma - 07/2020 SDH after fall    6. 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  He was on daily aspirin, Plavix was added and statin was continued.  - neurology stopped plavix and started high dose aspirin  7. Recent COVID + 08/23/22 - symptoms resolved  8. Dizzienss with standing - typically with getting  - caffeine free soda and coffee. 2 bottles of water per day, one 16 oz soda caffeine free, 1.5 cups of coffee in AM.   Past Medical History:  Diagnosis Date   Anemia    Arthritis    Asthma    with exertion   Back pain    CAD (coronary artery disease)    a. s/p STEMI in 2004 with stenting to LAD and LCx b. low-risk NST in 2015    Cataracts, bilateral    immature   Colitis    just finished one antibiotic and Cipro will be finished up   COPD (chronic obstructive pulmonary disease) ([place])    no meds required   Coronary artery disease    Diverticulosis    GERD (gastroesophageal reflux disease)    takes Omeprazole daily   Hemorrhoids    History of bronchitis    many yrs ago   History of colon polyps    Hyperlipidemia    takes Simvastatin nightly   Hypertension    takes Losartan and Metoprolol daily   Joint pain    Myocardial infarct ([place]) 09/17/2002   Paget disease of bone    takes Fosamax daily   Shortness of breath    URI (upper respiratory infection)    Dec 31,2014   Vertigo    takes Meclizine daily as needed     Allergies  Allergen Reactions   Codeine Nausea And Vomiting     Current Outpatient Medications  Medication Sig Dispense Refill   metoprolol tartrate (LOPRESSOR) 25 MG tablet TAKE 1/2 TABLET BY MOUTH TWICE DAILY 90 tablet 1   acetaminophen (TYLENOL) 325 MG tablet Take 650  mg by mouth every 6 (six) hours as needed for moderate pain.     albuterol (PROVENTIL HFA;VENTOLIN HFA) 108 (90 Base) MCG/ACT inhaler Inhale 2 puffs into the lungs every 6 (six) hours as needed for wheezing or shortness of breath.     albuterol (PROVENTIL) (2.5 MG/3ML) 0.083% nebulizer solution Take 2.5 mg by nebulization every 6 (six) hours as needed for wheezing or shortness of breath.     aspirin EC 325 MG tablet Take 325 mg by mouth daily with lunch.     cetirizine (ZYRTEC) 10 MG tablet Take 10 mg by mouth daily.     cholecalciferol (VITAMIN D) 1000 UNITS tablet Take 1,000 Units by mouth daily in the afternoon.     diclofenac Sodium (VOLTAREN) 1 % GEL Apply 1 application topically 4 (four) times daily as needed (pain).     losartan (COZAAR) 100 MG tablet TAKE ONE TABLET BY MOUTH DAILY 90 tablet 1   Multiple Vitamin (MULTIVITAMIN) capsule Take 1 capsule by mouth daily in the afternoon.     nitroGLYCERIN (NITROSTAT) 0.4  MG SL tablet Place 1 tablet (0.4 mg total) under the tongue every 5 (five) minutes as needed. Chest pain 25 tablet 3   omeprazole (PRILOSEC) 40 MG capsule Take 40 mg by mouth daily.     simvastatin (ZOCOR) 40 MG tablet TAKE ONE TABLET BY MOUTH AT BEDTIME 90 tablet 1   No current facility-administered medications for this visit.     Past Surgical History:  Procedure Laterality Date   ABDOMINAL EXPLORATION SURGERY     APPENDECTOMY     BACK SURGERY     x 2   bilateral knee arthroscopies     x 3   CARPAL TUNNEL RELEASE Right    COLONOSCOPY     COLONOSCOPY WITH PROPOFOL N/A 01/23/2022   Procedure: COLONOSCOPY WITH PROPOFOL;  Surgeon: [name] Quale, MD;  Location: [facility];  Service: Gastroenterology;  Laterality: N/A;  10:10 ASA 2   CORONARY ANGIOPLASTY     3 stents   DENTAL SURGERY     ESOPHAGOGASTRODUODENOSCOPY     HERNIA REPAIR     inguinal   JOINT REPLACEMENT Left    LEFT HEART CATHETERIZATION WITH CORONARY ANGIOGRAM N/A 08/27/2011   Procedure: LEFT HEART CATHETERIZATION WITH CORONARY ANGIOGRAM;  Surgeon: [name] Bow, MD;  Location: [facility] CATH LAB;  Service: Cardiovascular;  Laterality: N/A;   LUMBAR LAMINECTOMY WITH COFLEX 2 LEVEL N/A 01/03/2015   Procedure: Lumbar three-four,Lumbar four-five Laminectomy with Coflex;  Surgeon: [name] Miss, MD;  Location: [place] [facility];  Service: Neurosurgery;  Laterality: N/A;   POLYPECTOMY  01/23/2022   Procedure: POLYPECTOMY;  Surgeon: [name] Quale, MD;  Location: [facility];  Service: Gastroenterology;;   TOTAL KNEE ARTHROPLASTY Right 11/16/2013   [name] [name]   TOTAL KNEE ARTHROPLASTY Right 11/16/2013   Procedure: TOTAL KNEE ARTHROPLASTY;  Surgeon: [name] Salen, MD;  Location: [facility];  Service: Orthopedics;  Laterality: Right;   TOTAL KNEE ARTHROPLASTY Left 01/12/02     Allergies  Allergen Reactions   Codeine Nausea And Vomiting      Family History  Problem Relation Age of Onset   Heart attack Mother     Diabetes Mother    Emphysema Father    Atrial fibrillation Sister    Heart attack Brother    Heart attack Brother    Heart disease Brother    Breast cancer Sister      Social History [name] [name] reports that he  quit smoking about 23 years ago. His smoking use included cigarettes. He started smoking about 64 years ago. He has never used smokeless tobacco. Mr. Thain reports current alcohol use.   Review of Systems CONSTITUTIONAL: No weight loss, fever, chills, weakness or fatigue.  HEENT: Eyes: No visual loss, blurred vision, double vision or yellow sclerae.No hearing loss, sneezing, congestion, runny nose or sore throat.  SKIN: No rash or itching.  CARDIOVASCULAR: per hpi RESPIRATORY: No shortness of breath, cough or sputum.  GASTROINTESTINAL: No anorexia, nausea, vomiting or diarrhea. No abdominal pain or blood.  GENITOURINARY: No burning on urination, no polyuria NEUROLOGICAL: per hpi  MUSCULOSKELETAL: No muscle, back pain, joint pain or stiffness.  LYMPHATICS: No enlarged nodes. No history of splenectomy.  PSYCHIATRIC: No history of depression or anxiety.  ENDOCRINOLOGIC: No reports of sweating, cold or heat intolerance. No polyuria or polydipsia.  Marland Kitchen   Physical Examination Today's Vitals   09/03/22 1316  BP: 120/74  Pulse: 78  SpO2: 97%  Weight: 208 lb 6.4 oz (94.5 kg)  Height: '5\' 10"'$  (1.778 m)   Body mass index is 29.9 kg/m.  Gen: resting comfortably, no acute distress HEENT: no scleral icterus, pupils equal round and reactive, no palptable cervical adenopathy,  CV: RRR, no m/rg, no jvd Resp: Clear to auscultation bilaterally GI: abdomen is soft, non-tender, non-distended, normal bowel sounds, no hepatosplenomegaly MSK: extremities are warm, no edema.  Skin: warm, no rash Neuro:  no focal deficits Psych: appropriate affect   Diagnostic Studies 08/2003 Cath   FINDINGS:   1. Left main trunk: Large caliber vessel with mild irregularities.   2. LAD: This  begins as a large caliber vessel and tapers in the distal   section. Provides two diagonal branches. The proximal LAD has mild   disease of 30%. The mid LAD has eccentric plaque of 50%. The distal LAD   has a high grade narrowing of 95% medially after the second diagonal   Shamaya Kauer with TIMI 2 flow distally. The first diagonal Stephaniemarie Stoffel has an   ostial narrowing of 50-60%. The second diagonal Jaid Quirion has an ostial   narrowing of 30-40% and is a large caliber vessel.   3. Left circumflex artery: This is a medium caliber vessel. Provides   trivial first marginal Adleigh Mcmasters in the mid section and two marginal   branches distally. The proximal AV circumflex has high grade narrowing   of 70-80%. There is then further narrowing of 50-60% after the first   marginal Yaakov Saindon. The first marginal Laquinton Bihm has long tubular narrowing of   50% in the proximal segment. The second marginal Kazuo Durnil has an ostial   narrowing of 80%. The third marginal Akshaj Besancon has proximal narrowing of   70%.   4. Right coronary artery: Dominant. This is a large caliber vessel.   Provides the posterior descending artery and posterior ventricular Angline Schweigert   in terminal segment. The right coronary artery has diffuse disease of 30-   40% in the mid and distal section.   5. LV: Normal end-systolic and end-diastolic dimensions. Overall left   ventricular function is well preserved. Ejection fraction greater than   55%. No mitral regurgitation. There is akinesis of a small portion of   the mid anterior wall. LV pressure is 110/10. Aortic is 110/60. LVEDP   equals 15.   DESCRIPTION OF PROCEDURE: With these findings we elected to proceed with   percutaneous intervention to the distal LAD. The patient was given heparin   systemically as well as  Reopro to maintain an ACT of approximately 300   seconds using 600 mg of Plavix. During the case he developed some itching   and was given Benadryl and Solu-Medrol with improvement of this. A 6-French   Q4  guide catheter was used to engage the left coronary artery and a 0.014   inch Asahi Prowater wire introduced. This was used to cross the stenosis   and position the distal LAD. A 2.5 x 15 mm Voyager balloon was introduced   and used to dilate the distal LAD lesion at 6 atmospheres for 120 seconds.   Repeat angiography showed an improvement in vessel lumen. However, there   was severe residual plaque and it was felt that further intervention would   be required. We introduced a 2.75 x 15 mm cutting balloon and a total of   six inflations were performed within the distal LAD at up to 10 atmospheres   for 60 seconds. Repeat angiography showed modest improvement in vessel   lumen. However, in the LAO cranial views there still appeared to be   residual narrowing of 70-80% at the origin of the stenosis. Some plaque   shifting had occurred into the diagonal Lou Loewe as well. While we tried to   avoid stent placement, this became unavoidable. We positioned a 2.5 x 18 mm   Cypher stent into the distal LAD and deployed this at 10 atmospheres for 30   seconds. A 2.75 x 15 mm Quantum Maverick balloon was then used to post   dilate the stent. Two inflations were performed up to 14 atmospheres for 30   seconds each. Repeat angiography was then performed after the   administration of intracoronary nitroglycerin showing excellent result with   no residual stenosis in the distal LAD and improvement of TIMI grade 2 to   TIMI grade 3 flow in the distal LAD. There was some plaque shift to the   origin of the second diagonal Maralee Higuchi of 70-80%. However, there was still   TIMI 3 flow in this vessel. We decided to accept this result. Final   angiography was performed in various projections showing no distal vessel   damage. The guide catheter was then removed. An AngioSeal closure device   was then deployed to the right femoral artery due to mild oozing around the   sheath. There was persistence of oozing and a  FemStop was positioned until   hemostasis was achieved. The patient then transferred to the CCU in stable   condition. He tolerated the procedure well.   FINAL RESULTS: Successful PTCA and stent placement in the distal LAD with   reduction of 95% narrowing to 0% with placement of a 2.5 x 18 mm Cypher drug-   eluting stent dilated to 2.75 mm and improvement of TIMI grade 2 to TIMI   grade 3 flow.     08/2011 Echo   LVEF 62-37%, grade I diastolic dysfunction, difficult study.     09/04/13 EKG   Normal sinus rhythm, no ischemic changes     Jan 2015 Stress MPI IMPRESSION: Low risk Lexiscan Cardiolite. No diagnostic ST segment changes were noted. Rare PACs observed. Perfusion imaging is consistent with diaphragmatic attenuation affecting the inferior wall, no clear evidence of scar or ischemia. LV volumes are normal and LVEF is 62% without wall motion abnormality.   09/25/14 Clinic EKG SR with PACs     06/2017 PFTs Mild ventilatory defect, +air trapping, moderately reduced DLCO    10/2020 Echo [facility]  [place] Summary   1. Technically difficult study.    2. The left ventricle is normal in size with mildly increased wall  thickness.   3. The left ventricular systolic function is normal, LVEF is visually  estimated at 65-70%.    4. There is grade I diastolic dysfunction (impaired relaxation).    5. The aortic valve is trileaflet with mildly thickened leaflets with normal  excursion.   6. The left atrium is mildly dilated in size.    7. The right ventricle is upper normal in size, with normal systolic  function.   Assessment and Plan  1. CAD   -no symptoms - chronic SOB/DOE over the last few years with benigh echo and stress testing. Has COPD just on prn inhaler, asked him to discuss possible maintenece inahler with pcp     2 . Hyperlipidemia - reports muscle aches on more potent statins, has tolerated simvastatin - at goal, continue current meds    3. HTN - at goal by our  numbers, home bp elevated recnently but was on prednisone - update [place] on home bp's 1 weeks  4. Orthostatic dizziness - encouraged to increase oral hydration.       Arnoldo Lenis, M.D.

## 2022-09-18 DIAGNOSIS — M48062 Spinal stenosis, lumbar region with neurogenic claudication: Secondary | ICD-10-CM | POA: Diagnosis not present

## 2022-09-19 DIAGNOSIS — E119 Type 2 diabetes mellitus without complications: Secondary | ICD-10-CM | POA: Diagnosis not present

## 2022-09-20 DIAGNOSIS — M48062 Spinal stenosis, lumbar region with neurogenic claudication: Secondary | ICD-10-CM | POA: Diagnosis not present

## 2022-09-25 DIAGNOSIS — M48062 Spinal stenosis, lumbar region with neurogenic claudication: Secondary | ICD-10-CM | POA: Diagnosis not present

## 2022-09-27 DIAGNOSIS — M48062 Spinal stenosis, lumbar region with neurogenic claudication: Secondary | ICD-10-CM | POA: Diagnosis not present

## 2022-10-26 ENCOUNTER — Other Ambulatory Visit: Payer: Self-pay | Admitting: Cardiology

## 2022-11-29 DIAGNOSIS — M5117 Intervertebral disc disorders with radiculopathy, lumbosacral region: Secondary | ICD-10-CM | POA: Diagnosis not present

## 2022-11-29 DIAGNOSIS — M5417 Radiculopathy, lumbosacral region: Secondary | ICD-10-CM | POA: Diagnosis not present

## 2022-12-17 DIAGNOSIS — I6609 Occlusion and stenosis of unspecified middle cerebral artery: Secondary | ICD-10-CM | POA: Diagnosis not present

## 2022-12-17 DIAGNOSIS — H04123 Dry eye syndrome of bilateral lacrimal glands: Secondary | ICD-10-CM | POA: Diagnosis not present

## 2022-12-17 DIAGNOSIS — H25812 Combined forms of age-related cataract, left eye: Secondary | ICD-10-CM | POA: Diagnosis not present

## 2022-12-17 DIAGNOSIS — H35363 Drusen (degenerative) of macula, bilateral: Secondary | ICD-10-CM | POA: Diagnosis not present

## 2022-12-17 DIAGNOSIS — H25813 Combined forms of age-related cataract, bilateral: Secondary | ICD-10-CM | POA: Diagnosis not present

## 2022-12-17 DIAGNOSIS — H524 Presbyopia: Secondary | ICD-10-CM | POA: Diagnosis not present

## 2022-12-17 DIAGNOSIS — D3131 Benign neoplasm of right choroid: Secondary | ICD-10-CM | POA: Diagnosis not present

## 2023-01-21 ENCOUNTER — Other Ambulatory Visit: Payer: Self-pay | Admitting: Cardiology

## 2023-01-30 DIAGNOSIS — H25812 Combined forms of age-related cataract, left eye: Secondary | ICD-10-CM | POA: Diagnosis not present

## 2023-01-30 DIAGNOSIS — H2512 Age-related nuclear cataract, left eye: Secondary | ICD-10-CM | POA: Diagnosis not present

## 2023-01-30 DIAGNOSIS — H268 Other specified cataract: Secondary | ICD-10-CM | POA: Diagnosis not present

## 2023-01-31 DIAGNOSIS — H2512 Age-related nuclear cataract, left eye: Secondary | ICD-10-CM | POA: Diagnosis not present

## 2023-02-26 DIAGNOSIS — H25811 Combined forms of age-related cataract, right eye: Secondary | ICD-10-CM | POA: Diagnosis not present

## 2023-03-02 IMAGING — RF DG MYELOGRAM LUMBAR
9 series · 9 of 9 positions shown · non-contrast
Comparison: Lumbar MRI 09/13/2014. [HOSPITAL] [facility] CT
Abdomen and Pelvis 09/19/2011.

CLINICAL DATA: 75-year-old male with low back pain, left hip pain.
Bilateral leg weakness. Previous placement of interspinous spacer
devices.

EXAM:
LUMBAR MYELOGRAM
FLUOROSCOPY TIME:  0 minutes 36 seconds
PROCEDURE:
Lumbar puncture and intrathecal contrast administration were
performed by Dr. LORRAINE JIM who will separately report for the
portion of the procedure. I personally supervised acquisition of the
myelogram images.
TECHNIQUE: Contiguous axial images were obtained through the Lumbar spine after
the intrathecal infusion of infusion. Coronal and sagittal
reconstructions were obtained of the axial image sets.

[Series 1: cp_standard · 0.26mm/px · 1 of 1 slices shown (1 of 2)]
[im 1/1]
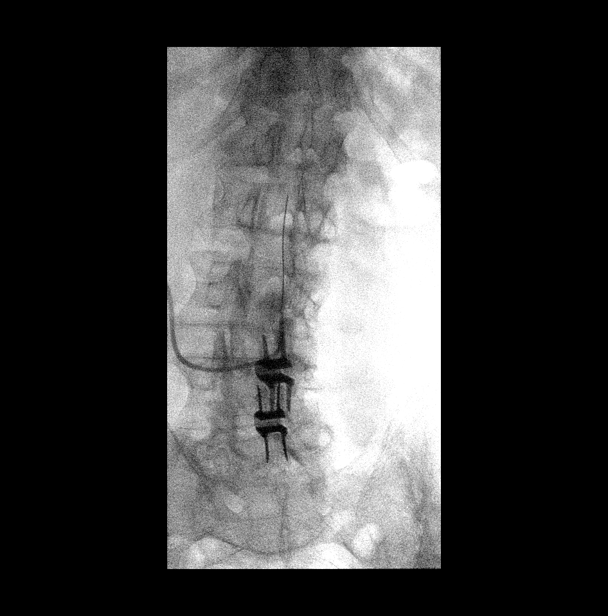

[Series 2: cp_standard · 0.18mm/px · 1 of 1 slices shown (2 of 2)]
[im 1/1]
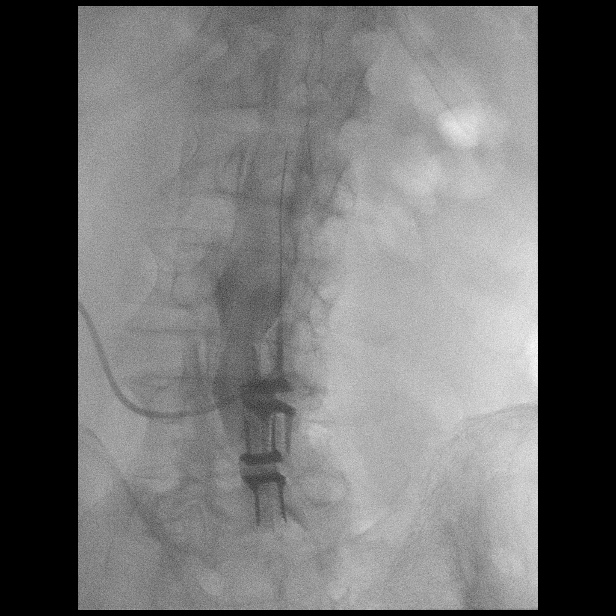

[Series 3: fluoro_myelogram_singleshot_bw · 0.20mm/px · 1 of 1 slices shown (1 of 7)]
[im 1/1]
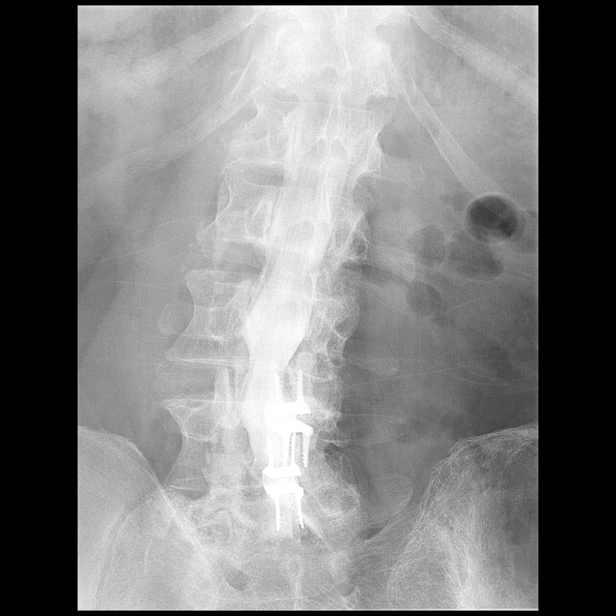

[Series 4: fluoro_myelogram_singleshot_bw · 0.20mm/px · 1 of 1 slices shown (2 of 7)]
[im 1/1]
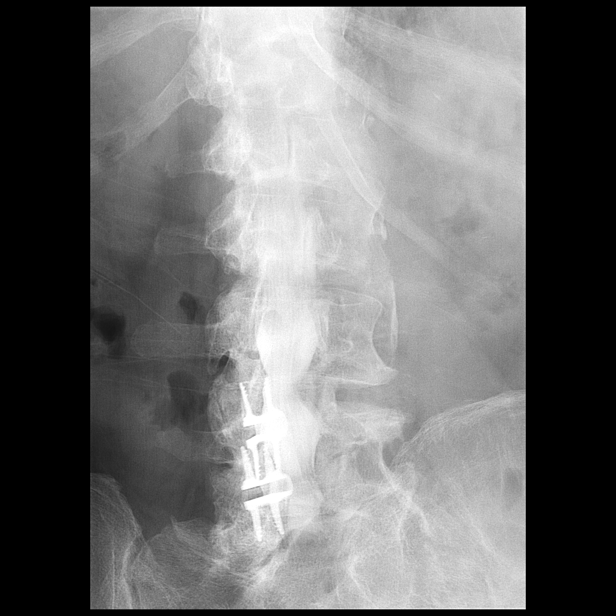

[Series 5: fluoro_myelogram_singleshot_bw · 0.20mm/px · 1 of 1 slices shown (3 of 7)]
[im 1/1]
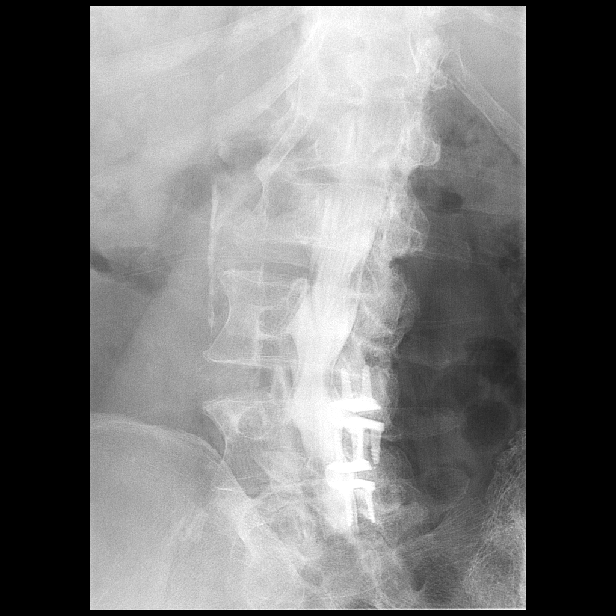

[Series 6: fluoro_myelogram_singleshot_bw · 0.20mm/px · 1 of 1 slices shown (4 of 7)]
[im 1/1]
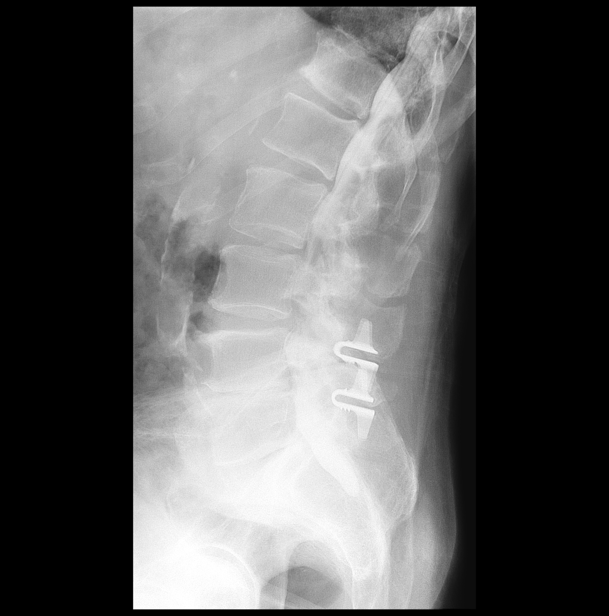

[Series 7: fluoro_myelogram_singleshot_bw · 0.18mm/px · 1 of 1 slices shown (5 of 7)]
[im 1/1]
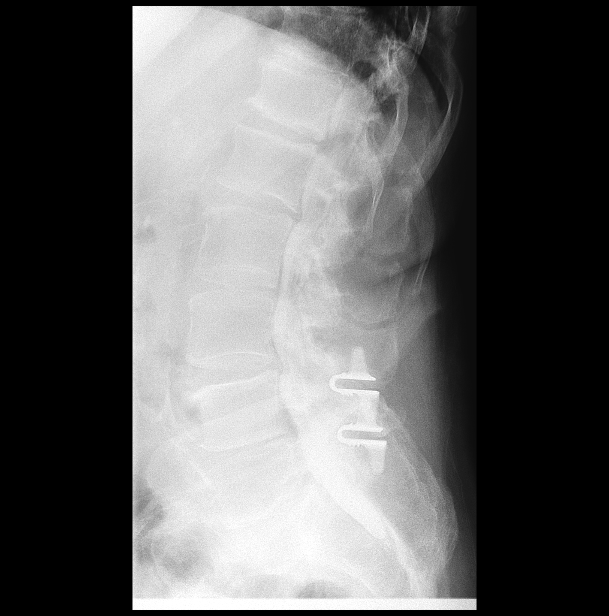

[Series 8: fluoro_myelogram_singleshot_bw · 0.17mm/px · 1 of 1 slices shown (6 of 7)]
[im 1/1]
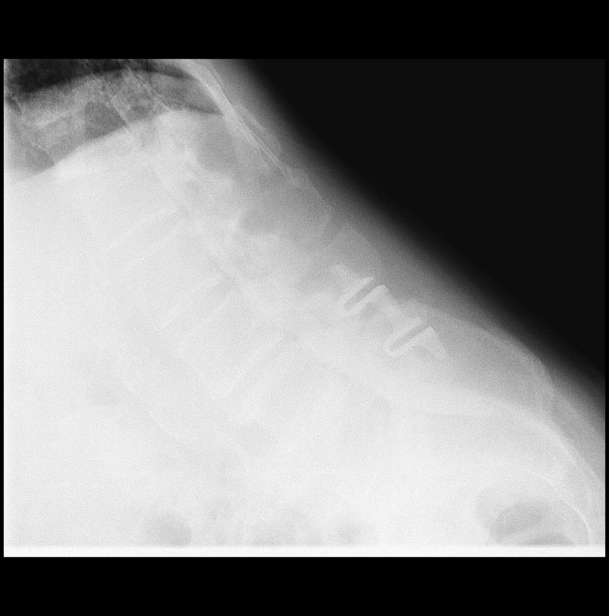

[Series 9: fluoro_myelogram_singleshot_bw · 0.17mm/px · 1 of 1 slices shown (7 of 7)]
[im 1/1]
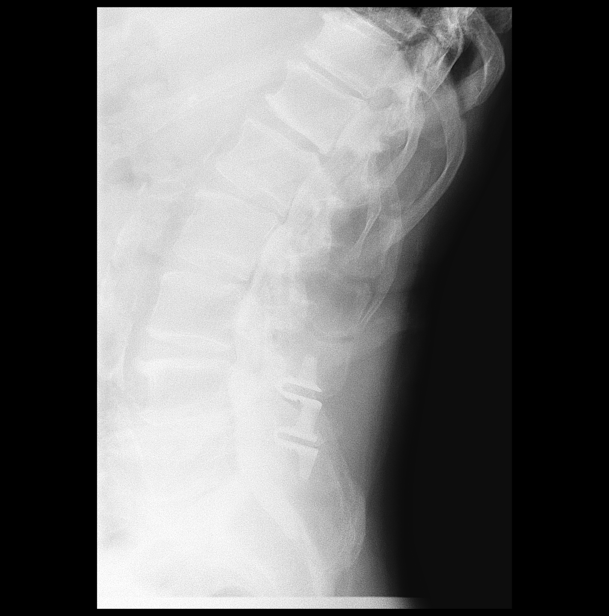

[9 of 9 positions shown; findings below may reference images not displayed]

FINDINGS: LUMBAR MYELOGRAM FINDINGS:

Good intrathecal contrast opacification. Normal lumbar segmentation.
Posterior interspinous spacer devices at L3-L4 and L4-L5. Hardware
appears intact. Chronic disc space loss at L4-L5 and to a lesser
extent L5-S1.

On the supine and lateral decubitus images there is some
circumferential mass effect on the thecal sac at L3-L4. Thecal sac
patency both above and below that level appears within normal
limits.

On the upright lateral views in neutral, flexion, and extension
positioning only mild mass effect on the ventral thecal sac at L3-L4
is apparent. Similar mild mass effect also at T12-L1 and L1-L2. No
abnormal motion in flexion/extension.

Calcified aortic atherosclerosis.

CT LUMBAR MYELOGRAM FINDINGS:

Alignment: Lumbar lordosis is stable since 5026, but levoconvex
lumbar scoliosis apex at L3-L4 has increased since that time and is
now mild to moderate (series 8, image 21).

Vertebrae: Postoperative details are below. Chronic Paget's disease
of the right hemipelvis. Intact visible sacrum. No acute osseous
abnormality identified.

Paraspinal and other soft tissues: Punctate right nephrolithiasis.
Aortoiliac calcified atherosclerosis. Normal caliber abdominal
aorta. Proximal duodenal diverticulum with no active inflammation.
Negative visible other noncontrast abdominal and pelvic viscera.
Lumbar paraspinal soft tissues are within normal limits.

Good intrathecal contrast opacification. Conus medullaris is at
T12-L1, see details below.

Disc levels:

T12-L1: Disc space loss since 7652 with broad-based left paracentral
and foraminal disc. Mild posterior element hypertrophy. Mild to
moderate left lateral recess stenosis at the level of the conus
(descending left L1 nerve level). And there is moderate to severe
left T12 foraminal stenosis.

L1-L2: Left eccentric circumferential disc bulge has mildly
progressed since 7652. Mild posterior element hypertrophy. Mild left
lateral recess stenosis (left L2 nerve level). No spinal or
foraminal stenosis.

L2-L3: Mild disc bulge and endplate spurring appears stable since
7652. There is moderate to severe facet and moderate ligament flavum
hypertrophy which is chronic but increased (series 5 image 49).
Subsequent borderline to mild spinal stenosis. Mild left L2
foraminal stenosis.

L3-L4: Posterior interspinous spacer device with some lucency along
the inferior component as seen on series 8, image 52, series 9,
image 49 and series 5, image 72. Solid bony incorporation at the
superior component, at the L4 spinous process L3 spinous process.
Bulky chronic circumferential disc bulge with some endplate spurring
eccentric to the right. But other postoperative changes to the
posterior elements including ligament tectum E since 7652. Spinal
stenosis seen at that time has resolved. There is mild right lateral
recess stenosis. Questionable mild clumping of nerve roots also
(series 5, image 64). Mild to moderate left but moderate to severe
right L3 foraminal stenosis has increased since 7652.

L4-L5: Posterior interspinous spacer device. More solid bony
incorporation at both the L4 and L5 spinous process compared to the
device above. Chronic severe disc space loss. Circumferential disc
osteophyte complex. Moderate bilateral facet hypertrophy. Previous
posterior decompression. Residual left lateral recess disc
osteophyte complex but no convincing spinal or lateral recess
stenosis. Mild clumping of nerve roots (series 5, image 78). Mild
bilateral L4 foraminal stenosis.

L5-S1: Chronic posterior disc space loss with vacuum disc.
Circumferential disc osteophyte complex. Moderate to severe facet
hypertrophy which is greater on the left, with associated left facet
vacuum phenomena and also progressed degeneration of a small left
L5-S1 assimilation joint since 5026 (series 5, image 88). No spinal
stenosis. Mild left lateral recess stenosis (left S1 nerve level).
Moderate to severe left L5 foraminal stenosis appears to be chronic.
Mild right foraminal stenosis.
IMPRESSION: 1. L3-L4 and L4-L5 interspinous spacer devices. Lucency at the
junction of the superior device with the L4 spinous process, but
otherwise solid-appearing incorporation. No associated spinal
ankylosis. But previous decompression also at both levels with no
spinal stenosis. Although questionable arachnoiditis. And there is
mild right lateral recess stenosis and moderate to severe right
foraminal stenosis at L3-L4.

2. Moderate to severe facet arthropathy at the adjacent L2-L3 and
L5-S1 segments - Severe on the left at L5-S1 and associated with
progressed degeneration of the left L5-S1 assimilation joint
compared to a 5026 CT.
However, no significant spinal stenosis at those levels. And
moderate to severe left L5 foraminal stenosis appears to be chronic.

3. T12-L1 disc degeneration since 7652 is eccentric to the left,
with up to moderate left lateral recess and severe left foraminal
stenosis at the left T12 and L1 nerve levels.

4.  Aortic Atherosclerosis ([id number].U).  Mild nephrolithiasis.

## 2023-03-06 DIAGNOSIS — H25811 Combined forms of age-related cataract, right eye: Secondary | ICD-10-CM | POA: Diagnosis not present

## 2023-03-13 ENCOUNTER — Ambulatory Visit: Payer: Medicare Other | Admitting: Cardiology

## 2023-03-13 ENCOUNTER — Other Ambulatory Visit (HOSPITAL_COMMUNITY): Payer: Self-pay | Admitting: Neurological Surgery

## 2023-03-13 DIAGNOSIS — M5416 Radiculopathy, lumbar region: Secondary | ICD-10-CM

## 2023-03-14 DIAGNOSIS — M5416 Radiculopathy, lumbar region: Secondary | ICD-10-CM | POA: Diagnosis not present

## 2023-03-14 DIAGNOSIS — M5116 Intervertebral disc disorders with radiculopathy, lumbar region: Secondary | ICD-10-CM | POA: Diagnosis not present

## 2023-03-19 ENCOUNTER — Ambulatory Visit (HOSPITAL_COMMUNITY)
Admission: RE | Admit: 2023-03-19 | Discharge: 2023-03-19 | Disposition: A | Discharge status: Home / Self Care | Payer: Medicare Other | Source: Ambulatory Visit | Attending: Family Medicine | Admitting: Family Medicine

## 2023-03-19 DIAGNOSIS — M48061 Spinal stenosis, lumbar region without neurogenic claudication: Secondary | ICD-10-CM | POA: Diagnosis not present

## 2023-03-19 DIAGNOSIS — M5416 Radiculopathy, lumbar region: Secondary | ICD-10-CM | POA: Diagnosis not present

## 2023-03-19 DIAGNOSIS — M4807 Spinal stenosis, lumbosacral region: Secondary | ICD-10-CM | POA: Diagnosis not present

## 2023-03-19 DIAGNOSIS — M4186 Other forms of scoliosis, lumbar region: Secondary | ICD-10-CM | POA: Diagnosis not present

## 2023-03-19 DIAGNOSIS — M5116 Intervertebral disc disorders with radiculopathy, lumbar region: Secondary | ICD-10-CM | POA: Diagnosis not present

## 2023-03-19 MED ORDER — GADOBUTROL 1 MMOL/ML IV SOLN
9.5000 mL | Freq: Once | INTRAVENOUS | Status: AC | PRN
  Administered 2023-03-19: 9.5 mL via INTRAVENOUS

## 2023-04-05 DIAGNOSIS — M5136 Other intervertebral disc degeneration, lumbar region: Secondary | ICD-10-CM | POA: Diagnosis not present

## 2023-04-08 ENCOUNTER — Encounter: Payer: Self-pay | Admitting: Nurse Practitioner

## 2023-04-08 ENCOUNTER — Ambulatory Visit: Payer: Medicare Other | Attending: Cardiology | Admitting: Nurse Practitioner

## 2023-04-08 VITALS — BP 138/80 | HR 72 | Ht 70.0 in | Wt 212.2 lb

## 2023-04-08 DIAGNOSIS — I2510 Atherosclerotic heart disease of native coronary artery without angina pectoris: Secondary | ICD-10-CM

## 2023-04-08 DIAGNOSIS — Z8673 Personal history of transient ischemic attack (TIA), and cerebral infarction without residual deficits: Secondary | ICD-10-CM | POA: Diagnosis not present

## 2023-04-08 DIAGNOSIS — I10 Essential (primary) hypertension: Secondary | ICD-10-CM | POA: Diagnosis not present

## 2023-04-08 DIAGNOSIS — R0609 Other forms of dyspnea: Secondary | ICD-10-CM | POA: Diagnosis not present

## 2023-04-08 DIAGNOSIS — J449 Chronic obstructive pulmonary disease, unspecified: Secondary | ICD-10-CM | POA: Diagnosis not present

## 2023-04-08 DIAGNOSIS — E785 Hyperlipidemia, unspecified: Secondary | ICD-10-CM

## 2023-04-08 MED ORDER — METOPROLOL TARTRATE 25 MG PO TABS: 12.5000 mg | ORAL_TABLET | Freq: Two times a day (BID) | ORAL | 1 refills | Status: DC

## 2023-04-08 MED ORDER — SIMVASTATIN 40 MG PO TABS: 40.0000 mg | ORAL_TABLET | Freq: Every day | ORAL | 1 refills | Status: DC

## 2023-04-08 NOTE — Progress Notes (Signed)
Cardiology Office Note:  .   Date:  04/08/2023  ID:  HELDER CRISAFULLI, DOB [birth year], MRN [id number] PCP: Catalina Lunger, DO  [facility] Providers Cardiologist:  Dina Rich, MD    History of Present Illness: .   [name] [name] is a 77 y.o. male with a PMH of CAD, s/p STEMI, chest pain, HLD, HTN, hx of tobacco use, COPD, hx of subdural hematoma after a fall, hx of CVA, hx of Covid-19, and orthostatic dizziness, who presents today for 6 month follow-up.   Last seen by Dr. Dina Rich on September 03, 2022. Had noted chronic SHOB/DOE over the past few years, denied any chest pain. Did admit to orthostatic dizziness, Dr. Wyline Mood encouraged him to increase oral hydration.   Today he presents for 6 month follow-up. He states he has noticed elevated BP readings over the past several months, particularly ever since he got a steroid injection in his lower back on March 14, 2023. Denies any chest pain, palpitations, syncope, presyncope, dizziness, orthopnea, PND, swelling or significant weight changes, acute bleeding, or claudication.  Does admit to easy bruising, wants to know if his dosage of aspirin can be decreased.  Wife has noticed that his dyspnea on exertion seems to have progressed, patient does admit to noticing the symptoms recently with the summer heat.   Studies Reviewed: Marland Kitchen    EKG:  EKG Interpretation Date/Time:  Monday April 08 2023 11:46:35 EDT Ventricular Rate:  63 PR Interval:  222 QRS Duration:  74 QT Interval:  374 QTC Calculation: 382 R Axis:   35  Text Interpretation: Sinus rhythm with sinus arrhythmia with 1st degree A-V block When compared with ECG of 29-Aug-2011 07:27, Premature atrial complexes are no longer Present Confirmed by Sharlene Dory [phone]) on 04/08/2023 11:48:31 AM   Lexiscan 01/2022:   Findings are consistent with prior inferior myocardial infarction. There is no current ischemia The study is low risk.   No ST deviation was noted.   LV  perfusion is abnormal. Large moderate intensity fixed inferior defect.   Left ventricular function is normal. Nuclear stress EF: 64 %. The left ventricular ejection fraction is normal (55-65%). End diastolic cavity size is normal.  Echocardiogram 08/2011:  Left ventricle:  The cavity size was normal. Wall thickness  was normal. Systolic function was normal. The estimated  ejection fraction was in the range of 50% to 55%. Images  were inadequate for LV wall motion assessment. Doppler  parameters are consistent with abnormal left ventricular  relaxation (grade 1 diastolic dysfunction).     Physical Exam:   VS:  BP 138/80   Pulse 72   Ht 5\' 10"  (1.778 m)   Wt 212 lb 3.2 oz (96.3 kg)   SpO2 92%   BMI 30.45 kg/m    Wt Readings from Last 3 Encounters:  04/08/23 212 lb 3.2 oz (96.3 kg)  09/03/22 208 lb 6.4 oz (94.5 kg)  02/16/22 216 lb 6.4 oz (98.2 kg)    GEN: Obese, 77 y.o. male in no acute distress NECK: No JVD; No carotid bruits CARDIAC: S1/S2, RRR, no murmurs, rubs, gallops RESPIRATORY:  Clear to auscultation with scattered expiratory wheezing, no rales or rhonchi noted ABDOMEN: Soft, non-tender, non-distended EXTREMITIES:  No edema; No deformity   ASSESSMENT AND PLAN: .    CAD, s/p STEMI Stable with no anginal symptoms. No indication for ischemic evaluation.  Continue aspirin, losartan, Lopressor, simvastatin, and nitroglycerin as needed. Heart healthy diet and regular cardiovascular exercise encouraged.  Will request most recent labs from Dr. Eliane Decree office.  HTN Will request most recent labs from Dr. Eliane Decree office.  SBP goal is less than 140.  Blood pressure log shows BP is currently not at goal.  After review of most recent labs, will plan to switch losartan to valsartan.  Continue Lopressor at current dosage as he has had history of bradycardia in the past with increased dosage. Heart healthy diet and regular cardiovascular exercise encouraged.  HLD Will request most recent  labs from Dr. Eliane Decree office.  Continue simvastatin. Heart healthy diet and regular cardiovascular exercise encouraged.    COPD, dyspnea on exertion Wife admits to recent dyspnea on exertion, patient admits to recent shortness of breath with exertion noticed during the summer.  He is routinely using nebulizer therapy, and I discussed with him the difference between maintenance therapy and rescue inhaler therapy for his COPD.  Discussed/recommended he see a pulmonologist.  He politely declined, stated he will discuss this with Dr. Allena Katz. Continue to follow with PCP.  Hx of CVA Stated/recommended to discuss dosage of aspirin with Dr. Allena Katz.  We are currently not managing this dosage.  No medication changes at this time.  Heart healthy diet and regular cardiovascular exercise encouraged.  Continue to follow with PCP.  Dispo: Follow-up with Dr. Dina Rich or APP in 6 months or sooner if anything changes.  Signed, Sharlene Dory, NP

## 2023-04-08 NOTE — Patient Instructions (Addendum)
Medication Instructions:  Your physician recommends that you continue on your current medications as directed. Please refer to the Current Medication list given to you today.  Labwork: none  Testing/Procedures: none  Follow-Up: Your physician recommends that you schedule a follow-up appointment in: 6 Months with Branch  Any Other Special Instructions Will Be Listed Below (If Applicable).  If you need a refill on your cardiac medications before your next appointment, please call your pharmacy.

## 2023-04-18 DIAGNOSIS — M5416 Radiculopathy, lumbar region: Secondary | ICD-10-CM | POA: Diagnosis not present

## 2023-05-03 ENCOUNTER — Ambulatory Visit
Admission: EM | Admit: 2023-05-03 | Discharge: 2023-05-03 | Disposition: A | Discharge status: Home / Self Care | Payer: Medicare Other | Attending: Nurse Practitioner | Admitting: Nurse Practitioner

## 2023-05-03 ENCOUNTER — Ambulatory Visit: Payer: Medicare Other

## 2023-05-03 DIAGNOSIS — R062 Wheezing: Secondary | ICD-10-CM | POA: Diagnosis not present

## 2023-05-03 DIAGNOSIS — Z1152 Encounter for screening for COVID-19: Secondary | ICD-10-CM | POA: Diagnosis not present

## 2023-05-03 DIAGNOSIS — J069 Acute upper respiratory infection, unspecified: Secondary | ICD-10-CM | POA: Insufficient documentation

## 2023-05-03 DIAGNOSIS — Z8709 Personal history of other diseases of the respiratory system: Secondary | ICD-10-CM | POA: Insufficient documentation

## 2023-05-03 DIAGNOSIS — R058 Other specified cough: Secondary | ICD-10-CM | POA: Insufficient documentation

## 2023-05-03 DIAGNOSIS — R059 Cough, unspecified: Secondary | ICD-10-CM | POA: Diagnosis not present

## 2023-05-03 DIAGNOSIS — Z20822 Contact with and (suspected) exposure to covid-19: Secondary | ICD-10-CM | POA: Insufficient documentation

## 2023-05-03 DIAGNOSIS — R0602 Shortness of breath: Secondary | ICD-10-CM | POA: Diagnosis not present

## 2023-05-03 DIAGNOSIS — J984 Other disorders of lung: Secondary | ICD-10-CM | POA: Diagnosis not present

## 2023-05-03 MED ORDER — PROMETHAZINE-DM 6.25-15 MG/5ML PO SYRP: 5.0000 mL | ORAL_SOLUTION | Freq: Four times a day (QID) | ORAL | 0 refills | Status: DC | PRN

## 2023-05-03 MED ORDER — AMOXICILLIN-POT CLAVULANATE 875-125 MG PO TABS
1.0000 | ORAL_TABLET | Freq: Two times a day (BID) | ORAL | 0 refills | Status: AC
Start: 2023-05-03 — End: 2023-05-08

## 2023-05-03 MED ORDER — PREDNISONE 50 MG PO TABS: ORAL_TABLET | ORAL | 0 refills | Status: DC

## 2023-05-03 MED ORDER — METHYLPREDNISOLONE SODIUM SUCC 125 MG IJ SOLR
125.0000 mg | Freq: Once | INTRAMUSCULAR | Status: AC
  Administered 2023-05-03: 125 mg via INTRAMUSCULAR

## 2023-05-03 NOTE — ED Notes (Addendum)
Attempted to make pt PCP appt, pt spouse reports "we already have an office we go to with [facility] but the doctor's keep changing." Declined new pt appt at this time.Provider aware.

## 2023-05-03 NOTE — ED Provider Notes (Signed)
[facility]    CSN: [id number] Arrival date & time: 05/03/23  0935      History   Chief Complaint No chief complaint on file.   HPI [name] [name] is a 77 y.o. male.   The history is provided by the patient.   Patient presents with a 4-day history of headache, body aches, cough, shortness of breath, and restlessness.  He also complains of wheezing.  Patient denies fever, chills, sore throat, ear pain, difficulty breathing, chest pain, abdominal pain, nausea, vomiting, or diarrhea.  Patient reports that he does have a history of COPD.  Reports that he does have a nebulizer machine at home but has not used it since his symptoms started.  Reports that he did take 3 amoxicillin from an old dental prescription that he had previously.  Patient reports that he recently returned from a cruise, and found out last evening that several people on the cruise tested positive for COVID.  Past Medical History:  Diagnosis Date   Anemia    Arthritis    Asthma    with exertion   Back pain    CAD (coronary artery disease)    a. s/p STEMI in 2004 with stenting to LAD and LCx b. low-risk NST in 2015   Cataracts, bilateral    immature   Colitis    just finished one antibiotic and Cipro will be finished up   COPD (chronic obstructive pulmonary disease) (HCC)    no meds required   Coronary artery disease    Diverticulosis    GERD (gastroesophageal reflux disease)    takes Omeprazole daily   Hemorrhoids    History of bronchitis    many yrs ago   History of colon polyps    Hyperlipidemia    takes Simvastatin nightly   Hypertension    takes Losartan and Metoprolol daily   Joint pain    Myocardial infarct (HCC) 09/17/2002   Paget disease of bone    takes Fosamax daily   Shortness of breath    URI (upper respiratory infection)    Dec 31,2014   Vertigo    takes Meclizine daily as needed    Patient Active Problem List   Diagnosis Date Noted   Lumbar spinal stenosis  01/03/2015   Arthritis of right knee 11/16/2013   Hyperlipidemia LDL goal < 100 08/06/2012   Essential hypertension 08/06/2012   Chest pain 08/29/2011   CAD, NATIVE VESSEL 07/10/2010   GERD 05/09/2009   ARTHRITIS 05/09/2009    Past Surgical History:  Procedure Laterality Date   ABDOMINAL EXPLORATION SURGERY     APPENDECTOMY     BACK SURGERY     x 2   bilateral knee arthroscopies     x 3   CARPAL TUNNEL RELEASE Right    COLONOSCOPY     COLONOSCOPY WITH PROPOFOL N/A 01/23/2022   Procedure: COLONOSCOPY WITH PROPOFOL;  Surgeon: Dolores Frame, MD;  Location: [facility];  Service: Gastroenterology;  Laterality: N/A;  10:10 ASA 2   CORONARY ANGIOPLASTY     3 stents   DENTAL SURGERY     ESOPHAGOGASTRODUODENOSCOPY     HERNIA REPAIR     inguinal   JOINT REPLACEMENT Left    LEFT HEART CATHETERIZATION WITH CORONARY ANGIOGRAM N/A 08/27/2011   Procedure: LEFT HEART CATHETERIZATION WITH CORONARY ANGIOGRAM;  Surgeon: Herby Abraham, MD;  Location: [facility] CATH LAB;  Service: Cardiovascular;  Laterality: N/A;   LUMBAR LAMINECTOMY WITH COFLEX 2 LEVEL N/A  01/03/2015   Procedure: Lumbar three-four,Lumbar four-five Laminectomy with Coflex;  Surgeon: Barnett Abu, MD;  Location: [facility];  Service: Neurosurgery;  Laterality: N/A;   POLYPECTOMY  01/23/2022   Procedure: POLYPECTOMY;  Surgeon: Dolores Frame, MD;  Location: [facility];  Service: Gastroenterology;;   TOTAL KNEE ARTHROPLASTY Right 11/16/2013   DR Turner Daniels   TOTAL KNEE ARTHROPLASTY Right 11/16/2013   Procedure: TOTAL KNEE ARTHROPLASTY;  Surgeon: Nestor Lewandowsky, MD;  Location: [facility] OR;  Service: Orthopedics;  Laterality: Right;   TOTAL KNEE ARTHROPLASTY Left 01/12/02       Home Medications    Prior to Admission medications   Medication Sig Start Date End Date Taking? Authorizing Provider  amoxicillin-clavulanate (AUGMENTIN) 875-125 MG tablet Take 1 tablet by mouth every 12 (twelve) hours for 5 days. 05/03/23  05/08/23 Yes Ineze Serrao-Warren, Sadie Haber, NP  predniSONE (DELTASONE) 50 MG tablet Take 1 tablet by mouth daily with breakfast for the next 5 days. 05/03/23  Yes Baker Kogler-Warren, Sadie Haber, NP  promethazine-dextromethorphan (PROMETHAZINE-DM) 6.25-15 MG/5ML syrup Take 5 mLs by mouth 4 (four) times daily as needed. 05/03/23  Yes Sirenia Whitis-Warren, Sadie Haber, NP  acetaminophen (TYLENOL) 325 MG tablet Take 650 mg by mouth every 6 (six) hours as needed for moderate pain.    [provider]  albuterol (PROVENTIL HFA;VENTOLIN HFA) 108 (90 Base) MCG/ACT inhaler Inhale 2 puffs into the lungs every 6 (six) hours as needed for wheezing or shortness of breath.    [provider]  albuterol (PROVENTIL) (2.5 MG/3ML) 0.083% nebulizer solution Take 2.5 mg by nebulization every 6 (six) hours as needed for wheezing or shortness of breath.    [provider]  aspirin EC 325 MG tablet Take 325 mg by mouth daily with lunch.    [provider]  cetirizine (ZYRTEC) 10 MG tablet Take 10 mg by mouth daily.    [provider]  cholecalciferol (VITAMIN D) 1000 UNITS tablet Take 1,000 Units by mouth daily in the afternoon.    [provider]  diazepam (VALIUM) 5 MG tablet Take 5 mg by mouth every 6 (six) hours as needed. 03/13/23   [provider]  diclofenac Sodium (VOLTAREN) 1 % GEL Apply 1 application topically 4 (four) times daily as needed (pain).    [provider]  HYDROcodone-acetaminophen (NORCO/VICODIN) 5-325 MG tablet Take 1 tablet by mouth every 6 (six) hours as needed. 04/05/23   [provider]  losartan (COZAAR) 100 MG tablet TAKE ONE TABLET BY MOUTH DAILY 10/29/22   Antoine Poche, MD  metoprolol tartrate (LOPRESSOR) 25 MG tablet Take 0.5 tablets (12.5 mg total) by mouth 2 (two) times daily. 04/08/23   Antoine Poche, MD  Multiple Vitamin (MULTIVITAMIN) capsule Take 1 capsule by mouth daily in the afternoon.    [provider]   nitroGLYCERIN (NITROSTAT) 0.4 MG SL tablet Place 1 tablet (0.4 mg total) under the tongue every 5 (five) minutes as needed. Chest pain 07/23/19   Antoine Poche, MD  omeprazole (PRILOSEC) 40 MG capsule Take 40 mg by mouth daily.    [provider]  simvastatin (ZOCOR) 40 MG tablet Take 1 tablet (40 mg total) by mouth at bedtime. 04/08/23   Antoine Poche, MD    Family History Family History  Problem Relation Age of Onset   Heart attack Mother    Diabetes Mother    Emphysema Father    Atrial fibrillation Sister    Heart attack Brother  Heart attack Brother    Heart disease Brother    Breast cancer Sister     Social History Social History   Tobacco Use   Smoking status: Former    Current packs/day: 0.00    Types: Cigarettes    Start date: 01/01/1958    Quit date: 09/17/1998    Years since quitting: 24.6   Smokeless tobacco: Never   Tobacco comments:    quit smoking in 2001  Vaping Use   Vaping status: Never Used  Substance Use Topics   Alcohol use: Not Currently    Comment: occasionally wine   Drug use: No     Allergies   Codeine   Review of Systems Review of Systems Per HPI  Physical Exam Triage Vital Signs ED Triage Vitals  Encounter Vitals Group     BP 05/03/23 0942 (!) 156/79     Systolic BP Percentile --      Diastolic BP Percentile --      Pulse Rate 05/03/23 0942 84     Resp 05/03/23 0942 16     Temp 05/03/23 0942 98.8 F (37.1 C)     Temp Source 05/03/23 0942 Oral     SpO2 05/03/23 0942 94 %     Weight --      Height --      Head Circumference --      Peak Flow --      Pain Score 05/03/23 0944 6     Pain Loc --      Pain Education --      Exclude from Growth Chart --    No data found.  Updated Vital Signs BP (!) 156/79 (BP Location: Right Arm)   Pulse 84   Temp 98.8 F (37.1 C) (Oral)   Resp 16   SpO2 94%   Visual Acuity Right Eye Distance:   Left Eye Distance:   Bilateral Distance:    Right Eye Near:   Left  Eye Near:    Bilateral Near:     Physical Exam Vitals and nursing note reviewed.  Constitutional:      General: He is not in acute distress.    Appearance: Normal appearance.  HENT:     Head: Normocephalic.     Right Ear: Tympanic membrane, ear canal and external ear normal.     Left Ear: Tympanic membrane, ear canal and external ear normal.     Nose: Congestion present.     Mouth/Throat:     Mouth: Mucous membranes are moist.     Pharynx: Posterior oropharyngeal erythema present.     Comments: Cobblestoning present to posterior oropharynx Eyes:     Extraocular Movements: Extraocular movements intact.     Conjunctiva/sclera: Conjunctivae normal.     Pupils: Pupils are equal, round, and reactive to light.  Cardiovascular:     Rate and Rhythm: Normal rate and regular rhythm.     Pulses: Normal pulses.     Heart sounds: Normal heart sounds.  Pulmonary:     Effort: Pulmonary effort is normal.     Breath sounds: Normal breath sounds. No decreased breath sounds, wheezing, rhonchi or rales.  Abdominal:     General: Bowel sounds are normal.     Palpations: Abdomen is soft.     Tenderness: There is no abdominal tenderness.  Musculoskeletal:     Cervical back: Normal range of motion.  Skin:    General: Skin is warm and dry.  Neurological:     General:  No focal deficit present.     Mental Status: He is alert and oriented to person, place, and time.  Psychiatric:        Mood and Affect: Mood normal.        Behavior: Behavior normal.      UC Treatments / Results  Labs (all labs ordered are listed, but only abnormal results are displayed) Labs Reviewed  SARS CORONAVIRUS 2 (TAT 6-24 HRS)    EKG   Radiology DG Chest 2 View  Result Date: 05/03/2023 CLINICAL DATA:  Cough and shortness of breath for 4 days.  Wheezing. EXAM: CHEST - 2 VIEW COMPARISON:  08/05/2020 FINDINGS: The lungs are clear without focal pneumonia, edema, pneumothorax or pleural effusion. Tiny nodules  overlying the right mid lung are similar to prior and also comparing back to 11/18/2013 consistent with benign etiology such as granulomata. New nodular density is seen in the right lung apex, not present on prior imaging. The cardiopericardial silhouette is within normal limits for size. No acute bony abnormality. IMPRESSION: 1. No acute cardiopulmonary findings. 2. New nodular density in the right lung apex. CT chest without contrast recommended to further evaluate. These results will be called to the ordering clinician or representative by the Radiologist Assistant, and communication documented in the PACS or [employer]. Electronically Signed   By: Kennith Center M.D.   On: 05/03/2023 11:12    Procedures Procedures (including critical care time)  Medications Ordered in UC Medications  methylPREDNISolone sodium succinate (SOLU-MEDROL) 125 mg/2 mL injection 125 mg (125 mg Intramuscular Given 05/03/23 1013)    Initial Impression / Assessment and Plan / UC Course  I have reviewed the triage vital signs and the nursing notes.  Pertinent labs & imaging results that were available during my care of the patient were reviewed by me and considered in my medical decision making (see chart for details).  The patient is well-appearing, he is in no acute distress, vital signs are stable.  Chest x-ray does not show pneumonia.  There is a new finding on his chest x-ray of a new nodular density in the right lung apex.  COVID test is also pending.  Patient is a candidate to receive molnupiravir if he chooses if the COVID test is positive.  Patient reports prior administration of Paxlovid caused him to lose his voice, and he declines use. Patient was given Solu-Medrol 125 mg IM for cough and bronchial inflammation.  Given patient's history of underlying COPD, will treat empirically with Augmentin 875/125 mg tablets for 5 days, prednisone 50 mg, and Promethazine DM.  Patient and spouse were advised to follow-up  with his primary care physician within the next 7 to 10 days or sooner for a chest CT per radiologist recommendation.  Patient was also given supportive care recommendations to include increasing fluids, allowing for plenty of rest, use of the albuterol inhaler or nebulizer, and ER follow-up precautions.  Patient and spouse verbalized understanding, all questions were answered.  Patient is stable for discharge.   Final Clinical Impressions(s) / UC Diagnoses   Final diagnoses:  Viral upper respiratory tract infection with cough  Encounter for screening for COVID-19  History of COPD  Cough with exposure to COVID-19 virus     Discharge Instructions      As discussed, the chest x-ray shows a new nodule in the right upper lung.  It is recommended that you follow-up with your primary care physician for a CAT scan of your chest for further evaluation  of the nodule.  It is recommended that you follow-up within the next 7 to 10 days, or sooner. COVID test is pending.   Take medication as prescribed. Increase fluids and allow for plenty of rest. Recommend using a humidifier in your bedroom at nighttime during sleep and sleeping elevated on pillows while cough symptoms persist. If you experience worsening shortness of breath or wheezing, or become unable to speak in a complete sentence, please go to the emergency department immediately for further evaluation. If your COVID test is positive, you will need to start antiviral treatment no later than 05/04/2023.     ED Prescriptions     Medication Sig Dispense Auth. Provider   predniSONE (DELTASONE) 50 MG tablet Take 1 tablet by mouth daily with breakfast for the next 5 days. 5 tablet Chidiebere Wynn-Warren, Sadie Haber, NP   promethazine-dextromethorphan (PROMETHAZINE-DM) 6.25-15 MG/5ML syrup Take 5 mLs by mouth 4 (four) times daily as needed. 118 mL Ronny Ruddell-Warren, Sadie Haber, NP   amoxicillin-clavulanate (AUGMENTIN) 875-125 MG tablet Take 1 tablet by mouth  every 12 (twelve) hours for 5 days. 10 tablet Kaleya Douse-Warren, Sadie Haber, NP      PDMP not reviewed this encounter.   Abran Cantor, NP 05/03/23 1157

## 2023-05-03 NOTE — ED Triage Notes (Signed)
Pt reports he has a deep cough, SOB, body aches, headache,and restless x 4 days.

## 2023-05-03 NOTE — Discharge Instructions (Addendum)
As discussed, the chest x-ray shows a new nodule in the right upper lung.  It is recommended that you follow-up with your primary care physician for a CAT scan of your chest for further evaluation of the nodule.  It is recommended that you follow-up within the next 7 to 10 days, or sooner. COVID test is pending.   Take medication as prescribed. Increase fluids and allow for plenty of rest. Recommend using a humidifier in your bedroom at nighttime during sleep and sleeping elevated on pillows while cough symptoms persist. If you experience worsening shortness of breath or wheezing, or become unable to speak in a complete sentence, please go to the emergency department immediately for further evaluation. If your COVID test is positive, you will need to start antiviral treatment no later than 05/04/2023.

## 2023-05-04 LAB — SARS CORONAVIRUS 2 (TAT 6-24 HRS): SARS Coronavirus 2: POSITIVE — AB

## 2023-05-14 DIAGNOSIS — J8410 Pulmonary fibrosis, unspecified: Secondary | ICD-10-CM | POA: Diagnosis not present

## 2023-05-14 DIAGNOSIS — Z87891 Personal history of nicotine dependence: Secondary | ICD-10-CM | POA: Diagnosis not present

## 2023-05-14 DIAGNOSIS — J432 Centrilobular emphysema: Secondary | ICD-10-CM | POA: Diagnosis not present

## 2023-05-14 DIAGNOSIS — U071 COVID-19: Secondary | ICD-10-CM | POA: Diagnosis not present

## 2023-05-15 DIAGNOSIS — L570 Actinic keratosis: Secondary | ICD-10-CM | POA: Diagnosis not present

## 2023-05-21 DIAGNOSIS — J8410 Pulmonary fibrosis, unspecified: Secondary | ICD-10-CM | POA: Diagnosis not present

## 2023-05-21 DIAGNOSIS — J432 Centrilobular emphysema: Secondary | ICD-10-CM | POA: Diagnosis not present

## 2023-05-21 DIAGNOSIS — R911 Solitary pulmonary nodule: Secondary | ICD-10-CM | POA: Diagnosis not present

## 2023-07-13 ENCOUNTER — Encounter: Payer: Self-pay | Admitting: Emergency Medicine

## 2023-07-13 ENCOUNTER — Ambulatory Visit
Admission: EM | Admit: 2023-07-13 | Discharge: 2023-07-13 | Disposition: A | Discharge status: Home / Self Care | Payer: Medicare Other | Attending: Internal Medicine | Admitting: Internal Medicine

## 2023-07-13 DIAGNOSIS — J441 Chronic obstructive pulmonary disease with (acute) exacerbation: Secondary | ICD-10-CM | POA: Insufficient documentation

## 2023-07-13 DIAGNOSIS — Z1152 Encounter for screening for COVID-19: Secondary | ICD-10-CM | POA: Diagnosis not present

## 2023-07-13 MED ORDER — PREDNISONE 20 MG PO TABS: 40.0000 mg | ORAL_TABLET | Freq: Every day | ORAL | 0 refills | Status: AC

## 2023-07-13 MED ORDER — AZITHROMYCIN 250 MG PO TABS: 250.0000 mg | ORAL_TABLET | Freq: Every day | ORAL | 0 refills | Status: DC

## 2023-07-13 MED ORDER — BENZONATATE 100 MG PO CAPS: 100.0000 mg | ORAL_CAPSULE | Freq: Three times a day (TID) | ORAL | 0 refills | Status: AC

## 2023-07-13 NOTE — ED Provider Notes (Signed)
[facility]    CSN: [id number] Arrival date & time: 07/13/23  0086      History   Chief Complaint No chief complaint on file.   HPI [name] [name] is a 77 y.o. male.   [name] [name] is a 77 y.o. male presenting for chief complaint of cough, nasal congestion, sore throat, generalized headache, body aches, fatigue, and intermittent shortness of breath associated with cough that started 2 days ago.  He has not had any fevers or chills with this and denies nausea, vomiting, diarrhea, abdominal pain, and rash.  No recent sick contacts with similar symptoms.  Cough is intermittently productive with yellow/green sputum but also sometimes dry.  Shortness of breath happens during coughing fits.  Denies leg swelling and orthopnea.  History of COPD, former smoker, denies recent antibiotic or steroid use.  He has access to an albuterol inhaler and nebulizer machine at home but has yet to use this as he states it would require him to sit down for 30 minutes and he is too busy.  He has not attempted use of any over-the-counter medications to help with symptoms PTA.     Past Medical History:  Diagnosis Date   Anemia    Arthritis    Asthma    with exertion   Back pain    CAD (coronary artery disease)    a. s/p STEMI in 2004 with stenting to LAD and LCx b. low-risk NST in 2015   Cataracts, bilateral    immature   Colitis    just finished one antibiotic and Cipro will be finished up   COPD (chronic obstructive pulmonary disease) (HCC)    no meds required   Coronary artery disease    Diverticulosis    GERD (gastroesophageal reflux disease)    takes Omeprazole daily   Hemorrhoids    History of bronchitis    many yrs ago   History of colon polyps    Hyperlipidemia    takes Simvastatin nightly   Hypertension    takes Losartan and Metoprolol daily   Joint pain    Myocardial infarct (HCC) 09/17/2002   Paget disease of bone    takes Fosamax daily   Shortness of breath     URI (upper respiratory infection)    Dec 31,2014   Vertigo    takes Meclizine daily as needed    Patient Active Problem List   Diagnosis Date Noted   Lumbar spinal stenosis 01/03/2015   Arthritis of right knee 11/16/2013   Hyperlipidemia with target low density lipoprotein (LDL) cholesterol less than 100 mg/dL 76/19/5093   Essential hypertension 08/06/2012   Chest pain 08/29/2011   CAD, NATIVE VESSEL 07/10/2010   GERD 05/09/2009   ARTHRITIS 05/09/2009    Past Surgical History:  Procedure Laterality Date   ABDOMINAL EXPLORATION SURGERY     APPENDECTOMY     BACK SURGERY     x 2   bilateral knee arthroscopies     x 3   CARPAL TUNNEL RELEASE Right    COLONOSCOPY     COLONOSCOPY WITH PROPOFOL N/A 01/23/2022   Procedure: COLONOSCOPY WITH PROPOFOL;  Surgeon: Dolores Frame, MD;  Location: [facility];  Service: Gastroenterology;  Laterality: N/A;  10:10 ASA 2   CORONARY ANGIOPLASTY     3 stents   DENTAL SURGERY     ESOPHAGOGASTRODUODENOSCOPY     HERNIA REPAIR     inguinal   JOINT REPLACEMENT Left    LEFT HEART  CATHETERIZATION WITH CORONARY ANGIOGRAM N/A 08/27/2011   Procedure: LEFT HEART CATHETERIZATION WITH CORONARY ANGIOGRAM;  Surgeon: Herby Abraham, MD;  Location: [facility];  Service: Cardiovascular;  Laterality: N/A;   LUMBAR LAMINECTOMY WITH COFLEX 2 LEVEL N/A 01/03/2015   Procedure: Lumbar three-four,Lumbar four-five Laminectomy with Coflex;  Surgeon: Barnett Abu, MD;  Location: [facility];  Service: Neurosurgery;  Laterality: N/A;   POLYPECTOMY  01/23/2022   Procedure: POLYPECTOMY;  Surgeon: Dolores Frame, MD;  Location: [facility];  Service: Gastroenterology;;   TOTAL KNEE ARTHROPLASTY Right 11/16/2013   DR Turner Daniels   TOTAL KNEE ARTHROPLASTY Right 11/16/2013   Procedure: TOTAL KNEE ARTHROPLASTY;  Surgeon: Nestor Lewandowsky, MD;  Location: [facility] OR;  Service: Orthopedics;  Laterality: Right;   TOTAL KNEE ARTHROPLASTY Left 01/12/02       Home  Medications    Prior to Admission medications   Medication Sig Start Date End Date Taking? Authorizing Provider  azithromycin (ZITHROMAX) 250 MG tablet Take 1 tablet (250 mg total) by mouth daily. Take first 2 tablets together, then 1 every day until finished. 07/13/23  Yes Carlisle Beers, FNP  benzonatate (TESSALON) 100 MG capsule Take 1 capsule (100 mg total) by mouth every 8 (eight) hours. 07/13/23  Yes Carlisle Beers, FNP  predniSONE (DELTASONE) 20 MG tablet Take 2 tablets (40 mg total) by mouth daily for 5 days. 07/13/23 07/18/23 Yes StanhopeDonavan Burnet, FNP  acetaminophen (TYLENOL) 325 MG tablet Take 650 mg by mouth every 6 (six) hours as needed for moderate pain.    [provider]  albuterol (PROVENTIL HFA;VENTOLIN HFA) 108 (90 Base) MCG/ACT inhaler Inhale 2 puffs into the lungs every 6 (six) hours as needed for wheezing or shortness of breath.    [provider]  albuterol (PROVENTIL) (2.5 MG/3ML) 0.083% nebulizer solution Take 2.5 mg by nebulization every 6 (six) hours as needed for wheezing or shortness of breath.    [provider]  aspirin EC 325 MG tablet Take 325 mg by mouth daily with lunch.    [provider]  cetirizine (ZYRTEC) 10 MG tablet Take 10 mg by mouth daily.    [provider]  cholecalciferol (VITAMIN D) 1000 UNITS tablet Take 1,000 Units by mouth daily in the afternoon.    [provider]  diazepam (VALIUM) 5 MG tablet Take 5 mg by mouth every 6 (six) hours as needed. 03/13/23   [provider]  diclofenac Sodium (VOLTAREN) 1 % GEL Apply 1 application topically 4 (four) times daily as needed (pain).    [provider]  HYDROcodone-acetaminophen (NORCO/VICODIN) 5-325 MG tablet Take 1 tablet by mouth every 6 (six) hours as needed. 04/05/23   [provider]  losartan (COZAAR) 100 MG tablet TAKE ONE TABLET BY MOUTH DAILY 10/29/22   Antoine Poche, MD  metoprolol tartrate  (LOPRESSOR) 25 MG tablet Take 0.5 tablets (12.5 mg total) by mouth 2 (two) times daily. 04/08/23   Antoine Poche, MD  Multiple Vitamin (MULTIVITAMIN) capsule Take 1 capsule by mouth daily in the afternoon.    [provider]  nitroGLYCERIN (NITROSTAT) 0.4 MG SL tablet Place 1 tablet (0.4 mg total) under the tongue every 5 (five) minutes as needed. Chest pain 07/23/19   Antoine Poche, MD  omeprazole (PRILOSEC) 40 MG capsule Take 40 mg by mouth daily.    [provider]  simvastatin (ZOCOR) 40 MG tablet Take 1 tablet (40 mg total) by mouth at bedtime. 04/08/23  Antoine Poche, MD    Family History Family History  Problem Relation Age of Onset   Heart attack Mother    Diabetes Mother    Emphysema Father    Atrial fibrillation Sister    Heart attack Brother    Heart attack Brother    Heart disease Brother    Breast cancer Sister     Social History Social History   Tobacco Use   Smoking status: Former    Current packs/day: 0.00    Types: Cigarettes    Start date: 01/01/1958    Quit date: 09/17/1998    Years since quitting: 24.8   Smokeless tobacco: Never   Tobacco comments:    quit smoking in 2001  Vaping Use   Vaping status: Never Used  Substance Use Topics   Alcohol use: Not Currently    Comment: occasionally wine   Drug use: No     Allergies   Codeine   Review of Systems Review of Systems Per HPI  Physical Exam Triage Vital Signs ED Triage Vitals  Encounter Vitals Group     BP 07/13/23 1006 (!) 161/75     Systolic BP Percentile --      Diastolic BP Percentile --      Pulse Rate 07/13/23 1006 85     Resp 07/13/23 1006 20     Temp 07/13/23 1006 98.4 F (36.9 C)     Temp Source 07/13/23 1006 Oral     SpO2 07/13/23 1006 94 %     Weight --      Height --      Head Circumference --      Peak Flow --      Pain Score 07/13/23 1007 7     Pain Loc --      Pain Education --      Exclude from Growth Chart --    No data  found.  Updated Vital Signs BP (!) 161/75 (BP Location: Right Arm)   Pulse 85   Temp 98.4 F (36.9 C) (Oral)   Resp 20   SpO2 94%   Visual Acuity Right Eye Distance:   Left Eye Distance:   Bilateral Distance:    Right Eye Near:   Left Eye Near:    Bilateral Near:     Physical Exam Vitals and nursing note reviewed.  Constitutional:      Appearance: He is ill-appearing. He is not toxic-appearing.  HENT:     Head: Normocephalic and atraumatic.     Right Ear: Hearing, tympanic membrane, ear canal and external ear normal.     Left Ear: Hearing, tympanic membrane, ear canal and external ear normal.     Nose: Congestion present.     Mouth/Throat:     Lips: Pink.     Mouth: Mucous membranes are moist. No injury.     Tongue: No lesions. Tongue does not deviate from midline.     Palate: No mass and lesions.     Pharynx: Oropharynx is clear. Uvula midline. No pharyngeal swelling, oropharyngeal exudate, posterior oropharyngeal erythema or uvula swelling.     Tonsils: No tonsillar exudate or tonsillar abscesses.  Eyes:     General: Lids are normal. Vision grossly intact. Gaze aligned appropriately.     Extraocular Movements: Extraocular movements intact.     Conjunctiva/sclera: Conjunctivae normal.  Cardiovascular:     Rate and Rhythm: Normal rate and regular rhythm.     Heart sounds: Normal heart sounds, S1 normal and  S2 normal.  Pulmonary:     Effort: Pulmonary effort is normal. No respiratory distress.     Breath sounds: Normal breath sounds and air entry. No wheezing, rhonchi or rales.     Comments: Coarse breath sounds throughout.  Speaking in full sentences without difficulty. Chest:     Chest wall: No tenderness.  Abdominal:     General: Abdomen is flat. Bowel sounds are normal.     Palpations: Abdomen is soft.  Musculoskeletal:     Cervical back: Neck supple.     Right lower leg: No edema.     Left lower leg: No edema.  Lymphadenopathy:     Cervical: No cervical  adenopathy.  Skin:    General: Skin is warm and dry.     Capillary Refill: Capillary refill takes less than 2 seconds.     Findings: No rash.  Neurological:     General: No focal deficit present.     Mental Status: He is alert and oriented to person, place, and time. Mental status is at baseline.     Cranial Nerves: No dysarthria or facial asymmetry.  Psychiatric:        Mood and Affect: Mood normal.        Speech: Speech normal.        Behavior: Behavior normal.        Thought Content: Thought content normal.        Judgment: Judgment normal.      UC Treatments / Results  Labs (all labs ordered are listed, but only abnormal results are displayed) Labs Reviewed  SARS CORONAVIRUS 2 (TAT 6-24 HRS)    EKG   Radiology No results found.  Procedures Procedures (including critical care time)  Medications Ordered in UC Medications - No data to display  Initial Impression / Assessment and Plan / UC Course  I have reviewed the triage vital signs and the nursing notes.  Pertinent labs & imaging results that were available during my care of the patient were reviewed by me and considered in my medical decision making (see chart for details).   1.  COPD exacerbation Presentation is consistent with acute COPD exacerbation.  Patient non-toxic in appearance, vital signs hemodynamically stable, no new oxygen requirement. Will treat with steroid, antibiotic, bronchodilator, cough suppressants for symptomatic relief, and expectorants (mucinex) as needed.  Encouraged to use his albuterol nebulizer/inhaler at home as needed. Prednisone burst sent to pharmacy to be taken as prescribed. Strep/Viral testing: COVID-19 testing is pending, patient is a candidate for antiviral therapy should he test positive for COVID-19. Imaging: Deferred based on stable cardiopulmonary exam findings, no new oxygen requirement, and hemodynamically stable vital signs with low suspicion for focal  consolidation/pneumonia finding.  Counseled patient on potential for adverse effects with medications prescribed/recommended today, strict ER and return-to-clinic precautions discussed, patient verbalized understanding.    Final Clinical Impressions(s) / UC Diagnoses   Final diagnoses:  COPD exacerbation (HCC)     Discharge Instructions      Your symptoms are due to COPD exacerbation.  - Use albuterol every 4-6 hours as needed for cough, shortness of breath, and wheeze. - Take antibiotic as prescribed to treat potential infection to the lungs. - Take the steroid sent to the pharmacy as directed to help reduce lung inflammation and decrease the risk of another attack in the next few days. No NSAIDs like ibuprofen or aleve/naproxen while taking steroid. Take with food to avoid stomach upset. - Take prescribed cough medicine as  directed.  If your symptoms do not improve in the next 2-3 days with interventions, please return. Please seek medical care for new or returning symptoms, such as difficulty breathing that doesn't improve with your medications, chest pain, voice changes, high fevers, confusion, or other new or worsening symptoms. Follow-up with PCP for ongoing management of asthma.    ED Prescriptions     Medication Sig Dispense Auth. Provider   predniSONE (DELTASONE) 20 MG tablet Take 2 tablets (40 mg total) by mouth daily for 5 days. 10 tablet Reita May M, FNP   benzonatate (TESSALON) 100 MG capsule Take 1 capsule (100 mg total) by mouth every 8 (eight) hours. 21 capsule Reita May M, FNP   azithromycin (ZITHROMAX) 250 MG tablet Take 1 tablet (250 mg total) by mouth daily. Take first 2 tablets together, then 1 every day until finished. 6 tablet Carlisle Beers, FNP      PDMP not reviewed this encounter.   Carlisle Beers, [place] 07/13/23 1213

## 2023-07-13 NOTE — ED Triage Notes (Signed)
Patient also states that while combing his hair this morning, he noticed his left eye was going cross eyed.  States this cleared up, but wanted to make sure we knew.

## 2023-07-13 NOTE — ED Triage Notes (Signed)
Cough, headache, body aches, nasal congestion x 2 days.

## 2023-07-13 NOTE — Discharge Instructions (Addendum)
Your symptoms are due to COPD exacerbation.  - Use albuterol every 4-6 hours as needed for cough, shortness of breath, and wheeze. - Take antibiotic as prescribed to treat potential infection to the lungs. - Take the steroid sent to the pharmacy as directed to help reduce lung inflammation and decrease the risk of another attack in the next few days. No NSAIDs like ibuprofen or aleve/naproxen while taking steroid. Take with food to avoid stomach upset. - Take prescribed cough medicine as directed.  If your symptoms do not improve in the next 2-3 days with interventions, please return. Please seek medical care for new or returning symptoms, such as difficulty breathing that doesn't improve with your medications, chest pain, voice changes, high fevers, confusion, or other new or worsening symptoms. Follow-up with PCP for ongoing management of asthma.

## 2023-07-14 LAB — SARS CORONAVIRUS 2 (TAT 6-24 HRS): SARS Coronavirus 2: NEGATIVE

## 2023-07-31 DIAGNOSIS — M48062 Spinal stenosis, lumbar region with neurogenic claudication: Secondary | ICD-10-CM | POA: Diagnosis not present

## 2023-08-06 ENCOUNTER — Other Ambulatory Visit: Payer: Self-pay | Admitting: Neurological Surgery

## 2023-08-07 ENCOUNTER — Other Ambulatory Visit: Payer: Self-pay | Admitting: Cardiology

## 2023-08-14 ENCOUNTER — Other Ambulatory Visit: Payer: Self-pay | Admitting: Neurological Surgery

## 2023-08-19 NOTE — Progress Notes (Addendum)
Surgical Instructions   Your procedure is scheduled on  August 26, 2023.Marland Kitchen Report to [facility] Main Entrance "A" at 8:45 A.M A.M., then check in with the Admitting office. Any questions or running late day of surgery: call [phone]  Questions prior to your surgery date: call [phone], Monday-Friday, 8am-4pm. If you experience any cold or flu symptoms such as cough, fever, chills, shortness of breath, etc. between now and your scheduled surgery, please notify us at the above number.     Remember:  Do not eat or drink after midnight the night before your surgery      Take these medicines the morning of surgery with A SIP OF WATER  cetirizine (ZYRTEC)  metoprolol tartrate (LOPRESSOR)  omeprazole (PRILOSEC)   May take these medicines IF NEEDED:  acetaminophen (TYLENOL)  albuterol (PROVENTIL HFA;VENTOLIN HFA) 108 (90 Base) MCG/ACT inhaler  albuterol (PROVENTIL) (2.5 MG/3ML) 0.083% nebulizer solution  benzonatate (TESSALON)  HYDROcodone-acetaminophen (NORCO/VICODIN)  methocarbamol (ROBAXIN  nitroGLYCERIN (NITROSTAT) If needed prior to surgery call [phone]   One week prior to surgery, STOP taking any Aspirin (unless otherwise instructed by your surgeon) Aleve, Naproxen, Ibuprofen, Motrin, Advil, Goody's, BC's, all herbal medications, fish oil, and non-prescription vitamins.                     Do NOT Smoke (Tobacco/Vaping) for 24 hours prior to your procedure.  If you use a CPAP at night, you may bring your mask/headgear for your overnight stay.   You will be asked to remove any contacts, glasses, piercing's, hearing aid's, dentures/partials prior to surgery. Please bring cases for these items if needed.    Patients discharged the day of surgery will not be allowed to drive home, and someone needs to stay with them for 24 hours.  SURGICAL WAITING ROOM VISITATION Patients may have no more than 2 support people in the waiting area - these visitors may rotate.    Pre-op nurse will coordinate an appropriate time for 1 ADULT support person, who may not rotate, to accompany patient in pre-op.  Children under the age of 48 must have an adult with them who is not the patient and must remain in the main waiting area with an adult.  If the patient needs to stay at the hospital during part of their recovery, the visitor guidelines for inpatient rooms apply.  Please refer to the [facility] website for the visitor guidelines for any additional information.   If you received a COVID test during your pre-op visit  it is requested that you wear a mask when out in public, stay away from anyone that may not be feeling well and notify your surgeon if you develop symptoms. If you have been in contact with anyone that has tested positive in the last 10 days please notify you surgeon.      Pre-operative 5 CHG Bathing Instructions   You can play a key role in reducing the risk of infection after surgery. Your skin needs to be as free of germs as possible. You can reduce the number of germs on your skin by washing with CHG (chlorhexidine gluconate) soap before surgery. CHG is an antiseptic soap that kills germs and continues to kill germs even after washing.   DO NOT use if you have an allergy to chlorhexidine/CHG or antibacterial soaps. If your skin becomes reddened or irritated, stop using the CHG and notify one of our RNs at [phone].   Please shower with the  CHG soap starting 4 days before surgery using the following schedule:     Please keep in mind the following:  DO NOT shave, including legs and underarms, starting the day of your first shower.   You may shave your face at any point before/day of surgery.  Place clean sheets on your bed the day you start using CHG soap. Use a clean washcloth (not used since being washed) for each shower. DO NOT sleep with pets once you start using the CHG.   CHG Shower Instructions:  Wash your face and private area  with normal soap. If you choose to wash your hair, wash first with your normal shampoo.  After you use shampoo/soap, rinse your hair and body thoroughly to remove shampoo/soap residue.  Turn the water OFF and apply about 3 tablespoons (45 ml) of CHG soap to a CLEAN washcloth.  Apply CHG soap ONLY FROM YOUR NECK DOWN TO YOUR TOES (washing for 3-5 minutes)  DO NOT use CHG soap on face, private areas, open wounds, or sores.  Pay special attention to the area where your surgery is being performed.  If you are having back surgery, having someone wash your back for you may be helpful. Wait 2 minutes after CHG soap is applied, then you may rinse off the CHG soap.  Pat dry with a clean towel  Put on clean clothes/pajamas   If you choose to wear lotion, please use ONLY the CHG-compatible lotions on the back of this paper.   Additional instructions for the day of surgery: DO NOT APPLY any lotions, deodorants, cologne, or perfumes.   Do not bring valuables to the hospital. [facility] is not responsible for any belongings/valuables. Do not wear nail polish, gel polish, artificial nails, or any other type of covering on natural nails (fingers and toes) Do not wear jewelry or makeup Put on clean/comfortable clothes.  Please brush your teeth.  Ask your nurse before applying any prescription medications to the skin.     CHG Compatible Lotions   Aveeno Moisturizing lotion  Cetaphil Moisturizing Cream  Cetaphil Moisturizing Lotion  Clairol Herbal Essence Moisturizing Lotion, Dry Skin  Clairol Herbal Essence Moisturizing Lotion, Extra Dry Skin  Clairol Herbal Essence Moisturizing Lotion, Normal Skin  Curel Age Defying Therapeutic Moisturizing Lotion with Alpha Hydroxy  Curel Extreme Care Body Lotion  Curel Soothing Hands Moisturizing Hand Lotion  Curel Therapeutic Moisturizing Cream, Fragrance-Free  Curel Therapeutic Moisturizing Lotion, Fragrance-Free  Curel Therapeutic Moisturizing Lotion,  Original Formula  Eucerin Daily Replenishing Lotion  Eucerin Dry Skin Therapy Plus Alpha Hydroxy Crme  Eucerin Dry Skin Therapy Plus Alpha Hydroxy Lotion  Eucerin Original Crme  Eucerin Original Lotion  Eucerin Plus Crme Eucerin Plus Lotion  Eucerin TriLipid Replenishing Lotion  Keri Anti-Bacterial Hand Lotion  Keri Deep Conditioning Original Lotion Dry Skin Formula Softly Scented  Keri Deep Conditioning Original Lotion, Fragrance Free Sensitive Skin Formula  Keri Lotion Fast Absorbing Fragrance Free Sensitive Skin Formula  Keri Lotion Fast Absorbing Softly Scented Dry Skin Formula  Keri Original Lotion  Keri Skin Renewal Lotion Keri Silky Smooth Lotion  Keri Silky Smooth Sensitive Skin Lotion  Nivea Body Creamy Conditioning Oil  Nivea Body Extra Enriched Teacher, adult education Moisturizing Lotion Nivea Crme  Nivea Skin Firming Lotion  NutraDerm 30 Skin Lotion  NutraDerm Skin Lotion  NutraDerm Therapeutic Skin Cream  NutraDerm Therapeutic Skin Lotion  ProShield Protective Hand Cream  Provon moisturizing lotion  Please read over  the following fact sheets that you were given.           Do NOT Smoke (Tobacco/Vaping) for 24 hours prior to your procedure.  If you use a CPAP at night, you may bring your mask/headgear for your overnight stay.   Contacts, glasses, piercing's, hearing aid's, dentures or partials may not be worn into surgery, please bring cases for these belongings.    For patients admitted to the hospital, discharge time will be determined by your treatment team.   Patients discharged the day of surgery will not be allowed to drive home, and someone needs to stay with them for 24 hours.  SURGICAL WAITING ROOM VISITATION Patients having surgery or a procedure may have no more than 2 support people in the waiting area - these visitors may rotate.   Children under the age of 55 must have an adult with them who is not the patient. If the  patient needs to stay at the hospital during part of their recovery, the visitor guidelines for inpatient rooms apply. Pre-op nurse will coordinate an appropriate time for 1 support person to accompany patient in pre-op.  This support person may not rotate.   Please refer to the [facility] website for the visitor guidelines for Inpatients (after your surgery is over and you are in a regular room).    Special instructions:   [facility]- Preparing For Surgery  Before surgery, you can play an important role. Because skin is not sterile, your skin needs to be as free of germs as possible. You can reduce the number of germs on your skin by washing with CHG (chlorahexidine gluconate) Soap before surgery.  CHG is an antiseptic cleaner which kills germs and bonds with the skin to continue killing germs even after washing.    Oral Hygiene is also important to reduce your risk of infection.  Remember - BRUSH YOUR TEETH THE MORNING OF SURGERY WITH YOUR REGULAR TOOTHPASTE  Please do not use if you have an allergy to CHG or antibacterial soaps. If your skin becomes reddened/irritated stop using the CHG.  Do not shave (including legs and underarms) for at least 48 hours prior to first CHG shower. It is OK to shave your face.  Please follow these instructions carefully.   Shower the NIGHT BEFORE SURGERY and the MORNING OF SURGERY  If you chose to wash your hair, wash your hair first as usual with your normal shampoo.  After you shampoo, rinse your hair and body thoroughly to remove the shampoo.  Use CHG Soap as you would any other liquid soap. You can apply CHG directly to the skin and wash gently with a scrungie or a clean washcloth.   Apply the CHG Soap to your body ONLY FROM THE NECK DOWN.  Do not use on open wounds or open sores. Avoid contact with your eyes, ears, mouth and genitals (private parts). Wash Face and genitals (private parts)  with your normal soap.   Wash thoroughly, paying special  attention to the area where your surgery will be performed.  Thoroughly rinse your body with warm water from the neck down.  DO NOT shower/wash with your normal soap after using and rinsing off the CHG Soap.  Pat yourself dry with a CLEAN TOWEL.  Wear CLEAN PAJAMAS to bed the night before surgery  Place CLEAN SHEETS on your bed the night before your surgery  DO NOT SLEEP WITH PETS.   Day of Surgery: Take a shower with CHG soap.  Do not wear jewelry or makeup Do not wear lotions, powders, perfumes/colognes, or deodorant. Do not shave 48 hours prior to surgery.  Men may shave face and neck. Do not bring valuables to the hospital.  [facility] is not responsible for any belongings or valuables. Do not wear nail polish, gel polish, artificial nails, or any other type of covering on natural nails (fingers and toes) If you have artificial nails or gel coating that need to be removed by a nail salon, please have this removed prior to surgery. Artificial nails or gel coating may interfere with anesthesia's ability to adequately monitor your vital signs. Wear Clean/Comfortable clothing the morning of surgery Remember to brush your teeth WITH YOUR REGULAR TOOTHPASTE.   Please read over the following fact sheets that you were given.    If you received a COVID test during your pre-op visit  it is requested that you wear a mask when out in public, stay away from anyone that may not be feeling well and notify your surgeon if you develop symptoms. If you have been in contact with anyone that has tested positive in the last 10 days please notify you surgeon.

## 2023-08-20 ENCOUNTER — Encounter (HOSPITAL_COMMUNITY): Payer: Self-pay

## 2023-08-20 ENCOUNTER — Telehealth: Payer: Self-pay

## 2023-08-20 ENCOUNTER — Telehealth: Payer: Self-pay | Admitting: Cardiology

## 2023-08-20 ENCOUNTER — Encounter (HOSPITAL_COMMUNITY)
Admission: RE | Admit: 2023-08-20 | Discharge: 2023-08-20 | Disposition: A | Discharge status: Home / Self Care | Payer: Medicare Other | Source: Ambulatory Visit | Attending: Neurological Surgery | Admitting: Neurological Surgery

## 2023-08-20 ENCOUNTER — Other Ambulatory Visit: Payer: Self-pay

## 2023-08-20 VITALS — BP 157/72 | HR 71 | Temp 98.0°F | Resp 17 | Ht 70.0 in | Wt 210.0 lb

## 2023-08-20 DIAGNOSIS — Z01812 Encounter for preprocedural laboratory examination: Secondary | ICD-10-CM | POA: Insufficient documentation

## 2023-08-20 DIAGNOSIS — Z01818 Encounter for other preprocedural examination: Secondary | ICD-10-CM

## 2023-08-20 LAB — BASIC METABOLIC PANEL
Anion gap: 10 (ref 5–15)
BUN: 18 mg/dL (ref 8–23)
CO2: 25 mmol/L (ref 22–32)
Calcium: 9.4 mg/dL (ref 8.9–10.3)
Chloride: 103 mmol/L (ref 98–111)
Creatinine, Ser: 1.16 mg/dL (ref 0.61–1.24)
GFR, Estimated: >60 mL/min (ref >60)
Glucose, Bld: 165 mg/dL — ABNORMAL HIGH (ref 70–99)
Potassium: 4.2 mmol/L (ref 3.5–5.1)
Sodium: 138 mmol/L (ref 135–145)

## 2023-08-20 LAB — CBC
HCT: 41.5 % (ref 39.0–52.0)
Hemoglobin: 13.3 g/dL (ref 13.0–17.0)
MCH: 27.7 pg (ref 26.0–34.0)
MCHC: 32 g/dL (ref 30.0–36.0)
MCV: 86.5 fL (ref 80.0–100.0)
Platelets: 221 10*3/uL (ref 150–400)
RBC: 4.8 MIL/uL (ref 4.22–5.81)
RDW: 12.8 % (ref 11.5–15.5)
WBC: 9.9 10*3/uL (ref 4.0–10.5)
nRBC: 0 % (ref 0.0–0.2)

## 2023-08-20 LAB — TYPE AND SCREEN
ABO/RH(D): O POS
Antibody Screen: NEGATIVE

## 2023-08-20 LAB — SURGICAL PCR SCREEN
MRSA, PCR: NEGATIVE
Staphylococcus aureus: NEGATIVE

## 2023-08-20 NOTE — Telephone Encounter (Signed)
  Patient Consent for Virtual Visit         [name] has provided verbal consent on 08/20/2023 for a virtual visit (video or telephone).   CONSENT FOR VIRTUAL VISIT FOR:  [name] [name]  By participating in this virtual visit I agree to the following:  I hereby voluntarily request, consent and authorize [facility] and its employed or contracted physicians, physician assistants, nurse practitioners or other licensed health care professionals (the Practitioner), to provide me with telemedicine health care services (the "Services") as deemed necessary by the treating Practitioner. I acknowledge and consent to receive the Services by the Practitioner via telemedicine. I understand that the telemedicine visit will involve communicating with the Practitioner through live audiovisual communication technology and the disclosure of certain medical information by electronic transmission. I acknowledge that I have been given the opportunity to request an in-person assessment or other available alternative prior to the telemedicine visit and am voluntarily participating in the telemedicine visit.  I understand that I have the right to withhold or withdraw my consent to the use of telemedicine in the course of my care at any time, without affecting my right to future care or treatment, and that the Practitioner or I may terminate the telemedicine visit at any time. I understand that I have the right to inspect all information obtained and/or recorded in the course of the telemedicine visit and may receive copies of available information for a reasonable fee.  I understand that some of the potential risks of receiving the Services via telemedicine include:  Delay or interruption in medical evaluation due to technological equipment failure or disruption; Information transmitted may not be sufficient (e.g. poor resolution of images) to allow for appropriate medical decision making by the  Practitioner; and/or  In rare instances, security protocols could fail, causing a breach of personal health information.  Furthermore, I acknowledge that it is my responsibility to provide information about my medical history, conditions and care that is complete and accurate to the best of my ability. I acknowledge that Practitioner's advice, recommendations, and/or decision may be based on factors not within their control, such as incomplete or inaccurate data provided by me or distortions of diagnostic images or specimens that may result from electronic transmissions. I understand that the practice of medicine is not an exact science and that Practitioner makes no warranties or guarantees regarding treatment outcomes. I acknowledge that a copy of this consent can be made available to me via my patient portal [facility] MyChart), or I can request a printed copy by calling the office of [facility].    I understand that my insurance will be billed for this visit.   I have read or had this consent read to me. I understand the contents of this consent, which adequately explains the benefits and risks of the Services being provided via telemedicine.  I have been provided ample opportunity to ask questions regarding this consent and the Services and have had my questions answered to my satisfaction. I give my informed consent for the services to be provided through the use of telemedicine in my medical care

## 2023-08-20 NOTE — Telephone Encounter (Signed)
   Name: [name] [name]  DOB: 09-14-1946  MRN: [id number]  Primary Cardiologist: Dina Rich, MD   Preoperative team, please contact this patient and set up a phone call appointment for further preoperative risk assessment. Please obtain consent and complete medication review. Thank you for your help.  I confirm that guidance regarding antiplatelet and oral anticoagulation therapy has been completed and, if necessary, noted below.  Aspirin 325mg  managed by neurology.   I also confirmed the patient resides in the state of [place]. As per [facility] Medical Board telemedicine laws, the patient must reside in the state in which the provider is licensed.   Denyce Robert, NP 08/20/2023, 11:52 AM [facility]

## 2023-08-20 NOTE — Progress Notes (Signed)
PCP - Dr. Jules Husbands Cardiologist - Dr. Dina Rich  PPM/ICD - denies Device Orders - na Rep Notified - na  Chest x-ray - 05/03/2023 EKG - 04/08/2023 Stress Test - 5/2/20223 ECHO - 08/29/2011 Cardiac Cath -   Sleep Study - denies CPAP - na  Non-diabetic  Last dose of GLP1 agonist-  denies GLP1 instructions: na  Blood Thinner Instructions: denies Aspirin Instructions: states last dose 08/19/2023  ERAS Protcol -NPO  COVID TEST- na   Anesthesia review: Yes. COPD, stroke, MI, HTN, first degree block  Patient denies shortness of breath, fever, cough and chest pain at PAT appointment   All instructions explained to the patient, with a verbal understanding of the material. Patient agrees to go over the instructions while at home for a better understanding. Patient also instructed to self quarantine after being tested for COVID-19. The opportunity to ask questions was provided.

## 2023-08-20 NOTE — Telephone Encounter (Signed)
   Pre-operative Risk Assessment    Patient Name: [name] [name]  DOB: 1946/08/24 MRN: [id number]      Request for Surgical Clearance    Procedure:   Lumbar Fusion  Date of Surgery:  Clearance 08/26/23                                 Surgeon:  Dr. Barnett Abu Surgeon's Group or Practice Name:  [facility] Phone number:  [phone] Ext. [phone] Fax number:  [phone]   Type of Clearance Requested:   - Medical  - Pharmacy:  Hold Aspirin TBD by cardiologist   Type of Anesthesia:  General    Additional requests/questions:  Please fax a copy of medical clearance to the surgeon's office.  Mardelle Matte   08/20/2023, 11:31 AM

## 2023-08-20 NOTE — Telephone Encounter (Signed)
Spoke the patient and he states he is agreeable with tele health appt.   I advised the patient that I would speak with provider for a 08/22/23 or 08/23/23 slot. And give them a call back once the provider gives me the ok.   Wife and patient voiced understanding.   Med list reviewed and consent given.

## 2023-08-21 ENCOUNTER — Telehealth: Payer: Self-pay | Admitting: Cardiology

## 2023-08-21 NOTE — Progress Notes (Signed)
Anesthesia Chart Review:  77 yo male follows with cardiology for hx of CAD s/p STEMI in 2004 with stent to LAD and Lcx, HLD, HTN, CVA. Nuclear stress 01/2022 showed inferior infarct, no current ischemia.  Cardiac clearance per progress note by Edd Fabian, NP 08/23/2023, "Chart reviewed as part of pre-operative protocol coverage. Given past medical history and time since last visit, based on ACC/AHA guidelines, [name] [name] would be at acceptable risk for the planned procedure without further cardiovascular testing. His RCRI is high risk, greater than 11% risk of major cardiac event.  He is able to complete greater than 4 METS of physical activity. Patient was advised that if he develops new symptoms prior to surgery to contact our office to arrange a follow-up appointment.  He verbalized understanding."  Former smoker with associated COPD.  Most recent exacerbation 07/13/2023.  He was treated with steroids, antibiotic, bronchodilator.  He is not maintained on any daily inhaled medications.  Other pertinent history includes GERD, vertigo, asthma, subdural hematoma 07/2020 after fall.  Preop labs reviewed, unremarkable.  CT chest 05/21/2023 (Care Everywhere): 1. The lesion of concern at the right lung apex is a calcified  granuloma and warrants no further workup.  2. Airway thickening is present, suggesting bronchitis or reactive  airways disease.  3. Punctate nonobstructive right kidney upper pole renal calculi.  4. Thoracic spondylosis and degenerative disc disease. Midline  posterior intervertebral spurring at the T8-9 level could contribute  to mild central narrowing of the thecal sac.  5. Atelectasis or scarring in the posterior basal segment left lower  lobe.  6. 6 by 3 by 2 mm (volume = 20 mm^3) solid noncalcified right  upper lobe nodule along the minor fissure is likely a subpleural  lymph node. No follow-up needed if patient is low-risk.This  recommendation follows the consensus  statement: Guidelines for  Management of Incidental Pulmonary Nodules Detected on CT Images:  From the Fleischner Society 2017; Radiology 2017; 284:228-243.  7. Aortic, systemic, and coronary atherosclerosis.   Nuclear stress 01/16/22:   Findings are consistent with prior inferior myocardial infarction. There is no current ischemia The study is low risk.   No ST deviation was noted.   LV perfusion is abnormal. Large moderate intensity fixed inferior defect.   Left ventricular function is normal. Nuclear stress EF: 64 %. The left ventricular ejection fraction is normal (55-65%). End diastolic cavity size is normal.    [name] [name] [facility] Stay [facility]/Anesthesiology Phone [phone] 08/23/2023 12:23 PM

## 2023-08-21 NOTE — Telephone Encounter (Signed)
Patient returned staff call.  See previous note dated 08/20/23.

## 2023-08-21 NOTE — Telephone Encounter (Signed)
SPOKE WITH THE PATIENT WHO IS AGREEABLE WITH TELEHEALTH ON FRIDAY 10:40AM. OK PER JESSE CLEAVER TO SCHEDULE ON THAT DAY AT THAT TIME.

## 2023-08-21 NOTE — Telephone Encounter (Signed)
Left message for the patient to contact the office. 

## 2023-08-21 NOTE — Telephone Encounter (Signed)
See previous message patient is schedule and is agreeable with telehealth appt.

## 2023-08-23 ENCOUNTER — Ambulatory Visit: Payer: Medicare Other | Attending: Internal Medicine

## 2023-08-23 DIAGNOSIS — Z01810 Encounter for preprocedural cardiovascular examination: Secondary | ICD-10-CM | POA: Diagnosis not present

## 2023-08-23 NOTE — Anesthesia Preprocedure Evaluation (Signed)
Anesthesia Evaluation  Patient identified by MRN, date of birth, ID band Patient awake    Reviewed: Allergy & Precautions, NPO status , Patient's Chart, lab work & pertinent test results, reviewed documented beta blocker date and time   History of Anesthesia Complications Negative for: history of anesthetic complications  Airway Mallampati: II  TM Distance: >3 FB     Dental no notable dental hx.    Pulmonary shortness of breath and with exertion, asthma , COPD,  COPD inhaler, former smoker   breath sounds clear to auscultation       Cardiovascular Exercise Tolerance: Poor hypertension, + CAD, + Past MI and + Cardiac Stents  (-) Peripheral Vascular Disease, (-) CHF and (-) DVT  Rhythm:Regular Rate:Normal     Neuro/Psych neg Seizures  Neuromuscular disease CVA, No Residual Symptoms    GI/Hepatic ,GERD  ,,(+) neg Cirrhosis        Endo/Other    Renal/GU Renal disease     Musculoskeletal  (+) Arthritis ,    Abdominal   Peds  Hematology   Anesthesia Other Findings   Reproductive/Obstetrics                              Anesthesia Physical Anesthesia Plan  ASA: 3  Anesthesia Plan: General   Post-op Pain Management:    Induction: Intravenous  PONV Risk Score and Plan: 2 and Ondansetron  Airway Management Planned: Oral ETT  Additional Equipment:   Intra-op Plan:   Post-operative Plan: Extubation in OR  Informed Consent: I have reviewed the patients History and Physical, chart, labs and discussed the procedure including the risks, benefits and alternatives for the proposed anesthesia with the patient or authorized representative who has indicated his/her understanding and acceptance.     Dental advisory given  Plan Discussed with: CRNA  Anesthesia Plan Comments: (PAT note by Antionette Poles, PA-C: 77 yo male follows with cardiology for hx of CAD s/p STEMI in 2004 with stent to  LAD and Lcx, HLD, HTN, CVA. Nuclear stress 01/2022 showed inferior infarct, no current ischemia.  Cardiac clearance per progress note by Edd Fabian, NP 08/23/2023, "Chart reviewed as part of pre-operative protocol coverage. Given past medical history and time since last visit, based on ACC/AHA guidelines, [name] [name] would be at acceptable risk for the planned procedure without further cardiovascular testing. His RCRI is high risk, greater than 11% risk of major cardiac event.  He is able to complete greater than 4 METS of physical activity. Patient was advised that if he develops new symptoms prior to surgery to contact our office to arrange a follow-up appointment.  He verbalized understanding."  Former smoker with associated COPD.  Most recent exacerbation 07/13/2023.  He was treated with steroids, antibiotic, bronchodilator.  He is not maintained on any daily inhaled medications.  Other pertinent history includes GERD, vertigo, asthma, subdural hematoma 07/2020 after fall.  Preop labs reviewed, unremarkable.  CT chest 05/21/2023 (Care Everywhere): 1. The lesion of concern at the right lung apex is a calcified  granuloma and warrants no further workup.  2. Airway thickening is present, suggesting bronchitis or reactive  airways disease.  3. Punctate nonobstructive right kidney upper pole renal calculi.  4. Thoracic spondylosis and degenerative disc disease. Midline  posterior intervertebral spurring at the T8-9 level could contribute  to mild central narrowing of the thecal sac.  5. Atelectasis or scarring in the posterior basal segment left lower  lobe.  6. 6 by 3 by 2 mm (volume = 20 mm^3) solid noncalcified right  upper lobe nodule along the minor fissure is likely a subpleural  lymph node. No follow-up needed if patient is low-risk.This  recommendation follows the consensus statement: Guidelines for  Management of Incidental Pulmonary Nodules Detected on CT Images:  From the  Fleischner Society 2017; Radiology 2017; 284:228-243.  7. Aortic, systemic, and coronary atherosclerosis.   Nuclear stress 01/16/22:   Findings are consistent with prior inferior myocardial infarction. There is no current ischemia The study is low risk.   No ST deviation was noted.   LV perfusion is abnormal. Large moderate intensity fixed inferior defect.   Left ventricular function is normal. Nuclear stress EF: 64 %. The left ventricular ejection fraction is normal (55-65%). End diastolic cavity size is normal.   )         Anesthesia Quick Evaluation

## 2023-08-23 NOTE — Progress Notes (Signed)
Virtual Visit via Telephone Note   Because of [name] [name]'s co-morbid illnesses, he is at least at moderate risk for complications without adequate follow up.  This format is felt to be most appropriate for this patient at this time.  The patient did not have access to video technology/had technical difficulties with video requiring transitioning to audio format only (telephone).  All issues noted in this document were discussed and addressed.  No physical exam could be performed with this format.  Please refer to the patient's chart for his consent to telehealth for [facility]  Evaluation Performed:  Preoperative cardiovascular risk assessment _____________   Date:  08/23/2023   Patient ID:  [name] [name], DOB 11/20/1945, MRN [id number] Patient Location:  Home Provider location:   Office  Primary Care Provider:  Lindaann Slough, DO Primary Cardiologist:  Dina Rich, MD  Chief Complaint / Patient Profile   77 y.o. y/o male with a h/o CAD, HTN, HLD, COPD, DOE, CVA who is pending lumbar fusion and presents today for telephonic preoperative cardiovascular risk assessment.  History of Present Illness    [name] [name] is a 77 y.o. male who presents via audio/video conferencing for a telehealth visit today.  Pt was last seen in cardiology clinic on Sharlene Dory, NP by 04/08/2023.  At that time [name] [name] was doing well .  The patient is now pending procedure as outlined above. Since his last visit, he continues to be stable from a cardiac standpoint.  Today he denies chest pain, shortness of breath, lower extremity edema, fatigue, palpitations, melena, hematuria, hemoptysis, diaphoresis, weakness, presyncope, syncope, orthopnea, and PND.   Past Medical History    Past Medical History:  Diagnosis Date   Anemia    Arthritis    Asthma    with exertion   Back pain    CAD (coronary artery disease)    a. s/p STEMI in 2004 with stenting to LAD and LCx  b. low-risk NST in 2015   Cataracts, bilateral    immature   Colitis    just finished one antibiotic and Cipro will be finished up   COPD (chronic obstructive pulmonary disease) (HCC)    no meds required   Coronary artery disease    Diverticulosis    GERD (gastroesophageal reflux disease)    takes Omeprazole daily   Hemorrhoids    History of bronchitis    many yrs ago   History of colon polyps    Hyperlipidemia    takes Simvastatin nightly   Hypertension    takes Losartan and Metoprolol daily   Joint pain    Myocardial infarct (HCC) 09/17/2002   Paget disease of bone    takes Fosamax daily   Shortness of breath    URI (upper respiratory infection)    Dec 31,2014   Vertigo    takes Meclizine daily as needed   Past Surgical History:  Procedure Laterality Date   ABDOMINAL EXPLORATION SURGERY     APPENDECTOMY     BACK SURGERY     x 2   bilateral knee arthroscopies     x 3   CARPAL TUNNEL RELEASE Right    COLONOSCOPY     COLONOSCOPY WITH PROPOFOL N/A 01/23/2022   Procedure: COLONOSCOPY WITH PROPOFOL;  Surgeon: Dolores Frame, MD;  Location: [facility];  Service: Gastroenterology;  Laterality: N/A;  10:10 ASA 2   CORONARY ANGIOPLASTY     3 stents   DENTAL SURGERY  ESOPHAGOGASTRODUODENOSCOPY     HERNIA REPAIR     inguinal   JOINT REPLACEMENT Left    LEFT HEART CATHETERIZATION WITH CORONARY ANGIOGRAM N/A 08/27/2011   Procedure: LEFT HEART CATHETERIZATION WITH CORONARY ANGIOGRAM;  Surgeon: Herby Abraham, MD;  Location: [facility] CATH LAB;  Service: Cardiovascular;  Laterality: N/A;   LUMBAR LAMINECTOMY WITH COFLEX 2 LEVEL N/A 01/03/2015   Procedure: Lumbar three-four,Lumbar four-five Laminectomy with Coflex;  Surgeon: Barnett Abu, MD;  Location: [facility];  Service: Neurosurgery;  Laterality: N/A;   POLYPECTOMY  01/23/2022   Procedure: POLYPECTOMY;  Surgeon: Dolores Frame, MD;  Location: [facility];  Service: Gastroenterology;;   TOTAL KNEE  ARTHROPLASTY Right 11/16/2013   DR Turner Daniels   TOTAL KNEE ARTHROPLASTY Right 11/16/2013   Procedure: TOTAL KNEE ARTHROPLASTY;  Surgeon: Nestor Lewandowsky, MD;  Location: [facility] OR;  Service: Orthopedics;  Laterality: Right;   TOTAL KNEE ARTHROPLASTY Left 01/12/02    Allergies  Allergies  Allergen Reactions   Codeine Nausea And Vomiting    Home Medications    Prior to Admission medications   Medication Sig Start Date End Date Taking? Authorizing Provider  acetaminophen (TYLENOL) 325 MG tablet Take 650 mg by mouth every 6 (six) hours as needed for moderate pain.    [provider]  albuterol (PROVENTIL HFA;VENTOLIN HFA) 108 (90 Base) MCG/ACT inhaler Inhale 2 puffs into the lungs every 6 (six) hours as needed for wheezing or shortness of breath.    [provider]  albuterol (PROVENTIL) (2.5 MG/3ML) 0.083% nebulizer solution Take 2.5 mg by nebulization every 6 (six) hours as needed for wheezing or shortness of breath.    [provider]  aspirin EC 325 MG tablet Take 325 mg by mouth daily with lunch.    [provider]  benzonatate (TESSALON) 100 MG capsule Take 1 capsule (100 mg total) by mouth every 8 (eight) hours. Patient taking differently: Take 100 mg by mouth 3 (three) times daily as needed for cough. 07/13/23   Carlisle Beers, FNP  cetirizine (ZYRTEC) 10 MG tablet Take 10 mg by mouth daily.    [provider]  HYDROcodone-acetaminophen (NORCO/VICODIN) 5-325 MG tablet Take 1 tablet by mouth every 6 (six) hours as needed for moderate pain (pain score 4-6). 04/05/23   [provider]  losartan (COZAAR) 100 MG tablet TAKE 1 TABLET BY MOUTH DAILY 08/07/23   Antoine Poche, MD  methocarbamol (ROBAXIN) 500 MG tablet Take 500 mg by mouth 4 (four) times daily as needed. 07/31/23   [provider]  metoprolol tartrate (LOPRESSOR) 25 MG tablet Take 0.5 tablets (12.5 mg total) by mouth 2 (two) times daily. 04/08/23   Antoine Poche, MD   Multiple Vitamin (MULTIVITAMIN) capsule Take 1 capsule by mouth daily in the afternoon. Patient not taking: Reported on 08/20/2023    [provider]  nitroGLYCERIN (NITROSTAT) 0.4 MG SL tablet Place 1 tablet (0.4 mg total) under the tongue every 5 (five) minutes as needed. Chest pain 07/23/19   Antoine Poche, MD  omeprazole (PRILOSEC) 40 MG capsule Take 40 mg by mouth daily.    [provider]  simvastatin (ZOCOR) 40 MG tablet Take 1 tablet (40 mg total) by mouth at bedtime. 04/08/23   Antoine Poche, MD    Physical Exam    Vital Signs:  Isla Pence does not have vital signs available for review today.  Given telephonic nature of communication, physical exam is limited. AAOx3. NAD. Normal  affect.  Speech and respirations are unlabored.  Accessory Clinical Findings    None  Assessment & Plan    1.  Preoperative Cardiovascular Risk Assessment: Lumbar Fusion   Date of Surgery:  Clearance 08/26/23                                 Surgeon:  Dr. Barnett Abu Surgeon's Group or Practice Name:  [facility] Phone number:  [phone] Ext. [phone] Fax number:  [phone]      Primary Cardiologist: Dina Rich, MD  Chart reviewed as part of pre-operative protocol coverage. Given past medical history and time since last visit, based on ACC/AHA guidelines, [name] [name] would be at acceptable risk for the planned procedure without further cardiovascular testing.   His RCRI is high risk, greater than 11% risk of major cardiac event.  He is able to complete greater than 4 METS of physical activity.  Patient was advised that if he develops new symptoms prior to surgery to contact our office to arrange a follow-up appointment.  He verbalized understanding.  His aspirin is not prescribed by cardiology.  Recommendations for holding aspirin will need to come from neurology.  I will route this recommendation to the requesting  party via Epic fax function and remove from pre-op pool.       Time:   Today, I have spent  5 minutes with the patient with telehealth technology discussing medical history, symptoms, and management plan.     Ronney Asters, NP  08/23/2023, 7:09 AM    Prior to patient's phone evaluation I spent greater than 10 minutes reviewing their past medical history and cardiac medications.

## 2023-08-26 ENCOUNTER — Other Ambulatory Visit: Payer: Self-pay

## 2023-08-26 ENCOUNTER — Inpatient Hospital Stay (HOSPITAL_COMMUNITY): Payer: Medicare Other

## 2023-08-26 ENCOUNTER — Encounter (HOSPITAL_COMMUNITY)
Admission: RE | Disposition: A | Discharge status: Home / Self Care | Payer: Self-pay | Source: Home / Self Care | Attending: Neurological Surgery

## 2023-08-26 ENCOUNTER — Inpatient Hospital Stay (HOSPITAL_COMMUNITY): Payer: Medicare Other | Admitting: Physician Assistant

## 2023-08-26 ENCOUNTER — Inpatient Hospital Stay (HOSPITAL_COMMUNITY)
Admission: RE | Admit: 2023-08-26 | Discharge: 2023-08-30 | DRG: 451 | Disposition: A | Discharge status: Home / Self Care | Payer: Medicare Other | Attending: Neurosurgery | Admitting: Neurosurgery

## 2023-08-26 ENCOUNTER — Inpatient Hospital Stay (HOSPITAL_COMMUNITY): Payer: Medicare Other | Admitting: Certified Registered"

## 2023-08-26 DIAGNOSIS — I440 Atrioventricular block, first degree: Secondary | ICD-10-CM | POA: Diagnosis present

## 2023-08-26 DIAGNOSIS — Z87891 Personal history of nicotine dependence: Secondary | ICD-10-CM

## 2023-08-26 DIAGNOSIS — E785 Hyperlipidemia, unspecified: Secondary | ICD-10-CM | POA: Diagnosis not present

## 2023-08-26 DIAGNOSIS — Z825 Family history of asthma and other chronic lower respiratory diseases: Secondary | ICD-10-CM

## 2023-08-26 DIAGNOSIS — M4726 Other spondylosis with radiculopathy, lumbar region: Secondary | ICD-10-CM | POA: Diagnosis not present

## 2023-08-26 DIAGNOSIS — M48062 Spinal stenosis, lumbar region with neurogenic claudication: Secondary | ICD-10-CM | POA: Diagnosis not present

## 2023-08-26 DIAGNOSIS — Z8249 Family history of ischemic heart disease and other diseases of the circulatory system: Secondary | ICD-10-CM

## 2023-08-26 DIAGNOSIS — Z79899 Other long term (current) drug therapy: Secondary | ICD-10-CM | POA: Diagnosis not present

## 2023-08-26 DIAGNOSIS — Z96653 Presence of artificial knee joint, bilateral: Secondary | ICD-10-CM | POA: Diagnosis present

## 2023-08-26 DIAGNOSIS — I10 Essential (primary) hypertension: Secondary | ICD-10-CM | POA: Diagnosis not present

## 2023-08-26 DIAGNOSIS — R918 Other nonspecific abnormal finding of lung field: Secondary | ICD-10-CM | POA: Diagnosis not present

## 2023-08-26 DIAGNOSIS — J939 Pneumothorax, unspecified: Secondary | ICD-10-CM | POA: Diagnosis not present

## 2023-08-26 DIAGNOSIS — Z955 Presence of coronary angioplasty implant and graft: Secondary | ICD-10-CM

## 2023-08-26 DIAGNOSIS — Z860100 Personal history of colon polyps, unspecified: Secondary | ICD-10-CM

## 2023-08-26 DIAGNOSIS — G8929 Other chronic pain: Secondary | ICD-10-CM | POA: Diagnosis present

## 2023-08-26 DIAGNOSIS — M5116 Intervertebral disc disorders with radiculopathy, lumbar region: Secondary | ICD-10-CM | POA: Diagnosis not present

## 2023-08-26 DIAGNOSIS — R0789 Other chest pain: Secondary | ICD-10-CM | POA: Diagnosis present

## 2023-08-26 DIAGNOSIS — M1990 Unspecified osteoarthritis, unspecified site: Secondary | ICD-10-CM | POA: Diagnosis not present

## 2023-08-26 DIAGNOSIS — J4489 Other specified chronic obstructive pulmonary disease: Secondary | ICD-10-CM | POA: Diagnosis not present

## 2023-08-26 DIAGNOSIS — J9811 Atelectasis: Secondary | ICD-10-CM | POA: Diagnosis not present

## 2023-08-26 DIAGNOSIS — Z01818 Encounter for other preprocedural examination: Secondary | ICD-10-CM

## 2023-08-26 DIAGNOSIS — I252 Old myocardial infarction: Secondary | ICD-10-CM

## 2023-08-26 DIAGNOSIS — I2510 Atherosclerotic heart disease of native coronary artery without angina pectoris: Secondary | ICD-10-CM | POA: Diagnosis present

## 2023-08-26 DIAGNOSIS — R0603 Acute respiratory distress: Secondary | ICD-10-CM | POA: Diagnosis not present

## 2023-08-26 DIAGNOSIS — M549 Dorsalgia, unspecified: Secondary | ICD-10-CM | POA: Diagnosis present

## 2023-08-26 DIAGNOSIS — K219 Gastro-esophageal reflux disease without esophagitis: Secondary | ICD-10-CM | POA: Diagnosis not present

## 2023-08-26 HISTORY — PX: ANTERIOR LAT LUMBAR FUSION: SHX1168

## 2023-08-26 LAB — CBC
HCT: 38.9 % — ABNORMAL LOW (ref 39.0–52.0)
Hemoglobin: 12.7 g/dL — ABNORMAL LOW (ref 13.0–17.0)
MCH: 27.9 pg (ref 26.0–34.0)
MCHC: 32.6 g/dL (ref 30.0–36.0)
MCV: 85.3 fL (ref 80.0–100.0)
Platelets: 201 10*3/uL (ref 150–400)
RBC: 4.56 MIL/uL (ref 4.22–5.81)
RDW: 12.7 % (ref 11.5–15.5)
WBC: 12.1 10*3/uL — ABNORMAL HIGH (ref 4.0–10.5)
nRBC: 0 % (ref 0.0–0.2)

## 2023-08-26 LAB — CREATININE, SERUM
Creatinine, Ser: 1.27 mg/dL — ABNORMAL HIGH (ref 0.61–1.24)
GFR, Estimated: 57 mL/min — ABNORMAL LOW (ref >60)

## 2023-08-26 SURGERY — ANTERIOR LATERAL LUMBAR FUSION 1 LEVEL
Anesthesia: General

## 2023-08-26 MED ORDER — PANTOPRAZOLE SODIUM 40 MG PO TBEC
80.0000 mg | DELAYED_RELEASE_TABLET | Freq: Every day | ORAL | Status: DC
  Administered 2023-08-27 – 2023-08-30 (×4): 80 mg via ORAL
  Filled 2023-08-26 (×4): qty 2

## 2023-08-26 MED ORDER — ROCURONIUM BROMIDE 10 MG/ML (PF) SYRINGE
PREFILLED_SYRINGE | INTRAVENOUS | Status: AC
  Filled 2023-08-26: qty 10

## 2023-08-26 MED ORDER — THROMBIN 5000 UNITS EX SOLR: OROMUCOSAL | Status: DC | PRN

## 2023-08-26 MED ORDER — OXYCODONE HCL 5 MG/5ML PO SOLN: 5.0000 mg | Freq: Once | ORAL | Status: DC | PRN

## 2023-08-26 MED ORDER — ACETAMINOPHEN 325 MG PO TABS
650.0000 mg | ORAL_TABLET | ORAL | Status: DC | PRN
  Administered 2023-08-27: 650 mg via ORAL
  Filled 2023-08-26 (×2): qty 2

## 2023-08-26 MED ORDER — SODIUM CHLORIDE 0.9% FLUSH: 3.0000 mL | INTRAVENOUS | Status: DC | PRN

## 2023-08-26 MED ORDER — ACETAMINOPHEN 650 MG RE SUPP: 650.0000 mg | RECTAL | Status: DC | PRN

## 2023-08-26 MED ORDER — DEXAMETHASONE SODIUM PHOSPHATE 10 MG/ML IJ SOLN
INTRAMUSCULAR | Status: DC | PRN
  Administered 2023-08-26: 8 mg via INTRAVENOUS

## 2023-08-26 MED ORDER — PROPOFOL 10 MG/ML IV BOLUS
INTRAVENOUS | Status: DC | PRN
  Administered 2023-08-26: 90 mg via INTRAVENOUS

## 2023-08-26 MED ORDER — PHENOL 1.4 % MT LIQD: 1.0000 | OROMUCOSAL | Status: DC | PRN

## 2023-08-26 MED ORDER — ALBUTEROL SULFATE (2.5 MG/3ML) 0.083% IN NEBU
2.5000 mg | INHALATION_SOLUTION | Freq: Four times a day (QID) | RESPIRATORY_TRACT | Status: DC | PRN
  Administered 2023-08-29: 2.5 mg via RESPIRATORY_TRACT
  Filled 2023-08-26: qty 3

## 2023-08-26 MED ORDER — BUPIVACAINE HCL (PF) 0.5 % IJ SOLN
INTRAMUSCULAR | Status: AC
  Filled 2023-08-26: qty 30

## 2023-08-26 MED ORDER — ORAL CARE MOUTH RINSE: 15.0000 mL | Freq: Once | OROMUCOSAL | Status: AC

## 2023-08-26 MED ORDER — CHLORHEXIDINE GLUCONATE CLOTH 2 % EX PADS: 6.0000 | MEDICATED_PAD | Freq: Once | CUTANEOUS | Status: DC

## 2023-08-26 MED ORDER — SIMVASTATIN 20 MG PO TABS
40.0000 mg | ORAL_TABLET | Freq: Every day | ORAL | Status: DC
  Administered 2023-08-26 – 2023-08-29 (×4): 40 mg via ORAL
  Filled 2023-08-26 (×6): qty 2

## 2023-08-26 MED ORDER — SODIUM CHLORIDE 0.9 % IV SOLN: 250.0000 mL | INTRAVENOUS | Status: AC

## 2023-08-26 MED ORDER — ACETAMINOPHEN 10 MG/ML IV SOLN: 1000.0000 mg | Freq: Once | INTRAVENOUS | Status: DC | PRN

## 2023-08-26 MED ORDER — ALBUTEROL SULFATE (2.5 MG/3ML) 0.083% IN NEBU
INHALATION_SOLUTION | RESPIRATORY_TRACT | Status: AC
  Filled 2023-08-26: qty 3

## 2023-08-26 MED ORDER — FENTANYL CITRATE (PF) 250 MCG/5ML IJ SOLN
INTRAMUSCULAR | Status: AC
  Filled 2023-08-26: qty 5

## 2023-08-26 MED ORDER — ONDANSETRON HCL 4 MG/2ML IJ SOLN
INTRAMUSCULAR | Status: AC
  Filled 2023-08-26: qty 2

## 2023-08-26 MED ORDER — METHOCARBAMOL 500 MG PO TABS
500.0000 mg | ORAL_TABLET | Freq: Four times a day (QID) | ORAL | Status: DC | PRN
  Administered 2023-08-26 – 2023-08-29 (×9): 500 mg via ORAL
  Filled 2023-08-26 (×9): qty 1

## 2023-08-26 MED ORDER — LACTATED RINGERS IV SOLN: INTRAVENOUS | Status: DC | PRN

## 2023-08-26 MED ORDER — CEFAZOLIN SODIUM-DEXTROSE 2-4 GM/100ML-% IV SOLN
2.0000 g | INTRAVENOUS | Status: AC
  Administered 2023-08-26: 2 g via INTRAVENOUS
  Filled 2023-08-26: qty 100

## 2023-08-26 MED ORDER — SENNA 8.6 MG PO TABS
1.0000 | ORAL_TABLET | Freq: Two times a day (BID) | ORAL | Status: DC
  Administered 2023-08-26 – 2023-08-29 (×5): 8.6 mg via ORAL
  Filled 2023-08-26 (×8): qty 1

## 2023-08-26 MED ORDER — HYDROMORPHONE HCL 1 MG/ML IJ SOLN
0.5000 mg | INTRAMUSCULAR | Status: DC | PRN
  Administered 2023-08-26 – 2023-08-28 (×3): 0.5 mg via INTRAVENOUS
  Filled 2023-08-26 (×3): qty 0.5

## 2023-08-26 MED ORDER — ONDANSETRON HCL 4 MG/2ML IJ SOLN
4.0000 mg | Freq: Four times a day (QID) | INTRAMUSCULAR | Status: DC | PRN
Start: 2023-08-26 — End: 2023-08-30

## 2023-08-26 MED ORDER — LOSARTAN POTASSIUM 50 MG PO TABS
100.0000 mg | ORAL_TABLET | Freq: Every day | ORAL | Status: DC
  Administered 2023-08-27 – 2023-08-30 (×4): 100 mg via ORAL
  Filled 2023-08-26 (×4): qty 2

## 2023-08-26 MED ORDER — ALBUTEROL SULFATE (2.5 MG/3ML) 0.083% IN NEBU
2.5000 mg | INHALATION_SOLUTION | Freq: Once | RESPIRATORY_TRACT | Status: AC
  Administered 2023-08-26: 2.5 mg via RESPIRATORY_TRACT

## 2023-08-26 MED ORDER — BUPIVACAINE HCL (PF) 0.5 % IJ SOLN
INTRAMUSCULAR | Status: DC | PRN
  Administered 2023-08-26: 5 mL

## 2023-08-26 MED ORDER — SUGAMMADEX SODIUM 200 MG/2ML IV SOLN
INTRAVENOUS | Status: DC | PRN
  Administered 2023-08-26: 200 mg via INTRAVENOUS

## 2023-08-26 MED ORDER — PROPOFOL 500 MG/50ML IV EMUL
INTRAVENOUS | Status: DC | PRN
  Administered 2023-08-26: 50 ug/kg/min via INTRAVENOUS

## 2023-08-26 MED ORDER — 0.9 % SODIUM CHLORIDE (POUR BTL) OPTIME
TOPICAL | Status: DC | PRN
  Administered 2023-08-26: 1000 mL

## 2023-08-26 MED ORDER — FLEET ENEMA RE ENEM: 1.0000 | ENEMA | Freq: Once | RECTAL | Status: DC | PRN

## 2023-08-26 MED ORDER — KETOROLAC TROMETHAMINE 15 MG/ML IJ SOLN
INTRAMUSCULAR | Status: AC
  Filled 2023-08-26: qty 1

## 2023-08-26 MED ORDER — OXYCODONE HCL 5 MG PO TABS: 5.0000 mg | ORAL_TABLET | Freq: Once | ORAL | Status: DC | PRN

## 2023-08-26 MED ORDER — NITROGLYCERIN 0.4 MG SL SUBL
0.4000 mg | SUBLINGUAL_TABLET | SUBLINGUAL | Status: DC | PRN
  Administered 2023-08-28 (×2): 0.4 mg via SUBLINGUAL
  Filled 2023-08-26: qty 1

## 2023-08-26 MED ORDER — LORATADINE 10 MG PO TABS
10.0000 mg | ORAL_TABLET | Freq: Every day | ORAL | Status: DC
  Administered 2023-08-27 – 2023-08-30 (×4): 10 mg via ORAL
  Filled 2023-08-26 (×4): qty 1

## 2023-08-26 MED ORDER — CEFAZOLIN SODIUM-DEXTROSE 2-4 GM/100ML-% IV SOLN
2.0000 g | Freq: Three times a day (TID) | INTRAVENOUS | Status: AC
  Administered 2023-08-26 – 2023-08-27 (×2): 2 g via INTRAVENOUS
  Filled 2023-08-26 (×2): qty 100

## 2023-08-26 MED ORDER — KETOROLAC TROMETHAMINE 15 MG/ML IJ SOLN
15.0000 mg | Freq: Once | INTRAMUSCULAR | Status: AC
  Administered 2023-08-26: 15 mg via INTRAVENOUS

## 2023-08-26 MED ORDER — ESMOLOL HCL 100 MG/10ML IV SOLN
INTRAVENOUS | Status: AC
  Filled 2023-08-26: qty 10

## 2023-08-26 MED ORDER — FENTANYL CITRATE (PF) 100 MCG/2ML IJ SOLN
INTRAMUSCULAR | Status: AC
  Filled 2023-08-26: qty 2

## 2023-08-26 MED ORDER — CHLORHEXIDINE GLUCONATE 0.12 % MT SOLN
15.0000 mL | Freq: Once | OROMUCOSAL | Status: AC
Start: 2023-08-26 — End: 2023-08-26
  Administered 2023-08-26: 15 mL via OROMUCOSAL
  Filled 2023-08-26: qty 15

## 2023-08-26 MED ORDER — LIDOCAINE 2% (20 MG/ML) 5 ML SYRINGE
INTRAMUSCULAR | Status: AC
  Filled 2023-08-26: qty 5

## 2023-08-26 MED ORDER — ROCURONIUM BROMIDE 10 MG/ML (PF) SYRINGE
PREFILLED_SYRINGE | INTRAVENOUS | Status: DC | PRN
  Administered 2023-08-26: 80 mg via INTRAVENOUS

## 2023-08-26 MED ORDER — PHENYLEPHRINE 80 MCG/ML (10ML) SYRINGE FOR IV PUSH (FOR BLOOD PRESSURE SUPPORT)
PREFILLED_SYRINGE | INTRAVENOUS | Status: DC | PRN
  Administered 2023-08-26: 80 ug via INTRAVENOUS
  Administered 2023-08-26: 40 ug via INTRAVENOUS

## 2023-08-26 MED ORDER — FENTANYL CITRATE (PF) 100 MCG/2ML IJ SOLN
25.0000 ug | INTRAMUSCULAR | Status: DC | PRN
  Administered 2023-08-26 (×2): 50 ug via INTRAVENOUS

## 2023-08-26 MED ORDER — FENTANYL CITRATE (PF) 250 MCG/5ML IJ SOLN
INTRAMUSCULAR | Status: DC | PRN
  Administered 2023-08-26 (×6): 50 ug via INTRAVENOUS

## 2023-08-26 MED ORDER — POLYETHYLENE GLYCOL 3350 17 G PO PACK: 17.0000 g | PACK | Freq: Every day | ORAL | Status: DC | PRN

## 2023-08-26 MED ORDER — MENTHOL 3 MG MT LOZG
1.0000 | LOZENGE | OROMUCOSAL | Status: DC | PRN
  Administered 2023-08-29: 3 mg via ORAL
  Filled 2023-08-26: qty 9

## 2023-08-26 MED ORDER — ONDANSETRON HCL 4 MG/2ML IJ SOLN
INTRAMUSCULAR | Status: DC | PRN
  Administered 2023-08-26: 4 mg via INTRAVENOUS

## 2023-08-26 MED ORDER — DEXAMETHASONE SODIUM PHOSPHATE 10 MG/ML IJ SOLN
INTRAMUSCULAR | Status: AC
  Filled 2023-08-26: qty 1

## 2023-08-26 MED ORDER — LIDOCAINE-EPINEPHRINE 1 %-1:100000 IJ SOLN
INTRAMUSCULAR | Status: DC | PRN
  Administered 2023-08-26: 5 mL

## 2023-08-26 MED ORDER — LIDOCAINE 2% (20 MG/ML) 5 ML SYRINGE
INTRAMUSCULAR | Status: DC | PRN
  Administered 2023-08-26: 100 mg via INTRAVENOUS

## 2023-08-26 MED ORDER — OXYCODONE-ACETAMINOPHEN 5-325 MG PO TABS
1.0000 | ORAL_TABLET | Freq: Four times a day (QID) | ORAL | Status: DC | PRN
  Administered 2023-08-26 – 2023-08-27 (×3): 2 via ORAL
  Administered 2023-08-27: 1 via ORAL
  Administered 2023-08-27 – 2023-08-29 (×6): 2 via ORAL
  Filled 2023-08-26 (×8): qty 2
  Filled 2023-08-26: qty 1
  Filled 2023-08-26 (×2): qty 2

## 2023-08-26 MED ORDER — ENOXAPARIN SODIUM 40 MG/0.4ML IJ SOSY
40.0000 mg | PREFILLED_SYRINGE | INTRAMUSCULAR | Status: DC
  Administered 2023-08-27 – 2023-08-30 (×4): 40 mg via SUBCUTANEOUS
  Filled 2023-08-26 (×4): qty 0.4

## 2023-08-26 MED ORDER — DOCUSATE SODIUM 100 MG PO CAPS
100.0000 mg | ORAL_CAPSULE | Freq: Two times a day (BID) | ORAL | Status: DC
  Administered 2023-08-26 – 2023-08-30 (×6): 100 mg via ORAL
  Filled 2023-08-26 (×8): qty 1

## 2023-08-26 MED ORDER — PHENYLEPHRINE 80 MCG/ML (10ML) SYRINGE FOR IV PUSH (FOR BLOOD PRESSURE SUPPORT)
PREFILLED_SYRINGE | INTRAVENOUS | Status: AC
  Filled 2023-08-26: qty 10

## 2023-08-26 MED ORDER — BENZONATATE 100 MG PO CAPS
100.0000 mg | ORAL_CAPSULE | Freq: Three times a day (TID) | ORAL | Status: DC | PRN
  Administered 2023-08-29: 100 mg via ORAL
  Filled 2023-08-26: qty 1

## 2023-08-26 MED ORDER — METOPROLOL TARTRATE 12.5 MG HALF TABLET
12.5000 mg | ORAL_TABLET | Freq: Two times a day (BID) | ORAL | Status: DC
Start: 2023-08-26 — End: 2023-08-29
  Administered 2023-08-26 – 2023-08-29 (×6): 12.5 mg via ORAL
  Filled 2023-08-26 (×6): qty 1

## 2023-08-26 MED ORDER — LIDOCAINE-EPINEPHRINE 1 %-1:100000 IJ SOLN
INTRAMUSCULAR | Status: AC
  Filled 2023-08-26: qty 1

## 2023-08-26 MED ORDER — ONDANSETRON HCL 4 MG/2ML IJ SOLN: 4.0000 mg | Freq: Once | INTRAMUSCULAR | Status: DC | PRN

## 2023-08-26 MED ORDER — PHENYLEPHRINE HCL-NACL 20-0.9 MG/250ML-% IV SOLN
INTRAVENOUS | Status: DC | PRN
  Administered 2023-08-26: 40 ug/min via INTRAVENOUS

## 2023-08-26 MED ORDER — BISACODYL 10 MG RE SUPP: 10.0000 mg | Freq: Every day | RECTAL | Status: DC | PRN

## 2023-08-26 MED ORDER — PROPOFOL 10 MG/ML IV BOLUS
INTRAVENOUS | Status: AC
  Filled 2023-08-26: qty 20

## 2023-08-26 MED ORDER — ONDANSETRON HCL 4 MG PO TABS
4.0000 mg | ORAL_TABLET | Freq: Four times a day (QID) | ORAL | Status: DC | PRN
Start: 2023-08-26 — End: 2023-08-30

## 2023-08-26 MED ORDER — SODIUM CHLORIDE 0.9% FLUSH
3.0000 mL | Freq: Two times a day (BID) | INTRAVENOUS | Status: DC
  Administered 2023-08-26 – 2023-08-30 (×8): 3 mL via INTRAVENOUS

## 2023-08-26 MED ORDER — METHOCARBAMOL 1000 MG/10ML IJ SOLN: 500.0000 mg | Freq: Four times a day (QID) | INTRAMUSCULAR | Status: DC | PRN

## 2023-08-26 MED ORDER — THROMBIN 5000 UNITS EX SOLR
CUTANEOUS | Status: AC
  Filled 2023-08-26: qty 5000

## 2023-08-26 SURGICAL SUPPLY — 45 items
BAG COUNTER SPONGE SURGICOUNT (BAG) ×2 IMPLANT
BLADE CLIPPER SURG (BLADE) IMPLANT
BOLT 5.0X50 SPINAL (Bolt) IMPLANT
BOLT PLATE XLIF 5.5X55 LRG (Bolt) IMPLANT
BONE MATRIX OSTEOCEL PRO LRG (Bone Implant) IMPLANT
CATH ROBINSON RED A/P 14FR (CATHETERS) IMPLANT
DERMABOND ADVANCED .7 DNX12 (GAUZE/BANDAGES/DRESSINGS) ×2 IMPLANT
DRAPE C-ARM 42X72 X-RAY (DRAPES) ×2 IMPLANT
DRAPE C-ARMOR (DRAPES) ×2 IMPLANT
DRAPE LAPAROTOMY 100X72X124 (DRAPES) ×2 IMPLANT
DRSG OPSITE POSTOP 4X6 (GAUZE/BANDAGES/DRESSINGS) IMPLANT
DURAPREP 26ML APPLICATOR (WOUND CARE) ×2 IMPLANT
ELECT BLADE 4.0 EZ CLEAN MEGAD (MISCELLANEOUS) ×1 IMPLANT
ELECT REM PT RETURN 9FT ADLT (ELECTROSURGICAL) ×1 IMPLANT
ELECTRODE BLDE 4.0 EZ CLN MEGD (MISCELLANEOUS) IMPLANT
ELECTRODE REM PT RTRN 9FT ADLT (ELECTROSURGICAL) ×2 IMPLANT
GAUZE 4X4 16PLY ~~LOC~~+RFID DBL (SPONGE) IMPLANT
GLOVE BIOGEL PI IND STRL 8.5 (GLOVE) ×2 IMPLANT
GLOVE ECLIPSE 8.5 STRL (GLOVE) ×2 IMPLANT
GLOVE EXAM NITRILE XL STR (GLOVE) IMPLANT
GOWN STRL REUS W/ TWL LRG LVL3 (GOWN DISPOSABLE) IMPLANT
GOWN STRL REUS W/ TWL XL LVL3 (GOWN DISPOSABLE) ×2 IMPLANT
GOWN STRL REUS W/TWL 2XL LVL3 (GOWN DISPOSABLE) ×2 IMPLANT
HEMOSTAT POWDER KIT SURGIFOAM (HEMOSTASIS) IMPLANT
KIT BASIN OR (CUSTOM PROCEDURE TRAY) ×2 IMPLANT
KIT DILATOR XLIF 5 (KITS) IMPLANT
KIT SURGICAL ACCESS MAXCESS 4 (KITS) IMPLANT
KIT TURNOVER KIT B (KITS) ×2 IMPLANT
MODULE NVM5 NEXT GEN EMG (NEUROSURGERY SUPPLIES) IMPLANT
MODULUS XLW 12X22X55MM 10 (Spine Construct) IMPLANT
NDL HYPO 25X1 1.5 SAFETY (NEEDLE) ×2 IMPLANT
NEEDLE HYPO 25X1 1.5 SAFETY (NEEDLE) ×1 IMPLANT
NS IRRIG 1000ML POUR BTL (IV SOLUTION) ×2 IMPLANT
PACK LAMINECTOMY NEURO (CUSTOM PROCEDURE TRAY) ×2 IMPLANT
PLATE DECADE 4HOLE SZ12 XLIF (Plate) IMPLANT
SPONGE T-LAP 4X18 ~~LOC~~+RFID (SPONGE) IMPLANT
SUT VIC AB 2-0 CP2 18 (SUTURE) IMPLANT
SUT VIC AB 3-0 SH 8-18 (SUTURE) ×2 IMPLANT
SUT VIC AB 4-0 RB1 18 (SUTURE) ×2 IMPLANT
SUT VICRYL 2 0 18 UND BR (SUTURE) IMPLANT
TAPE CLOTH 4X10 WHT NS (GAUZE/BANDAGES/DRESSINGS) ×4 IMPLANT
TOWEL GREEN STERILE (TOWEL DISPOSABLE) ×2 IMPLANT
TOWEL GREEN STERILE FF (TOWEL DISPOSABLE) ×2 IMPLANT
TRAY FOLEY MTR SLVR 16FR STAT (SET/KITS/TRAYS/PACK) ×2 IMPLANT
WATER STERILE IRR 1000ML POUR (IV SOLUTION) ×2 IMPLANT

## 2023-08-26 NOTE — Op Note (Signed)
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  History of spondylosis L3-4 and L4-5. Postoperative diagnosis: Same Procedure: Anterolateral decompression L1-L2 using XLIF technique with fluoroscopic imaging and EMG monitoring.  Deasis with allograft.  Lateral plate fixation [id number] with fluoroscopic imaging. Surgeon: Barnett Abu Anesthesia: General Endotracheal Indications: [name]. [name] is a 76 year old individual whose had progressive deterioration of gait and increasing in refractory back pain.  He had been treated with conservative efforts using epidural injections.  Patient has had previous decompression at the levels of L3-4 and L4-5 but now has a new stenosis that has evolved at the L1-L2 level with severe and advanced Modic changes in the disc space at the L1-L2 level.  He was advised regarding the need for surgical decompression and stabilization at this level.  Procedure: Patient was brought to the operating room supine on the stretcher.  After the smooth induction of general endotracheal anesthesia EMG monitoring electrodes were applied to the major muscles of the lumbar plexus.  The patient was then carefully turned to the right lateral decubitus position and secured to the operating table with tape after fluoroscopic images that demonstrated an to be orthogonal to the table and in the planes of the fluoroscopic imaging.  Then the skin was marked for access incision to the left side at L1-L2.  Skin was cleansed with alcohol DuraPrep and draped in a sterile fashion.  Then after infiltrating with 10 cc of lidocaine with epinephrine mixed 50-50 with half percent Marcaine incision was taken down and carried down through the fascia.  A blunt tipped probe was then passed through the outer fascia and carefully using a 1 thing type technique was advanced to the lateral aspect of the disc space at L1-L2.   Fluoroscopic imaging was used to localize this over the disc space and after doing some stimulation identifying no elements of the lumbar plexus a K wire was passed into the disc space.  A series of dilators were then passed over this tube to obtain a trajectory for a self-retaining retractor to be placed over the dilators.  EMG monitoring was performed with stimulation at each individual dilatation.  Then self-retaining retractor with 140 mm long blades was passed over the largest dilator.  After stimulating posteriorly and noting no elements of the lumbar plexus being and placed a shim was placed into the disc space at L1-L2.  The retractor was connected to the operating table with a clamp.  It was slightly dilated to remove the inner tubes and the area was carefully inspected.  Further opening of the retractor yielded good access to the lateral aspect of the disc base.  Then using a 15 blade the lateral aspect of the ligament was opened and some fragments of disc material were removed in this region we are then able to pass a series of dilators along with using Cobb elevators to open the contralateral ligament.  This allowed for distraction of the disc space nicely and evacuation of a modest amount of severely degenerated desiccated disc material the endplates were rongeured smooth and ultimately it was felt that a 12 mm tall spacer with 10 degrees lordosis measuring 22 mm in anterior posterior dimension with 55 mm in width across the disc space would fit best into this interval this was filled with Osteocel allograft.  It was then tamped into position under fluoroscopic guidance.  Once in position the lateral aspect of the vertebrae at L1  and L2 were cleared to allow placement of a 12 mm dynamic plate.  This was secured to the lateral aspect of the vertebrae using 55 mm long screws ventrally and 50 mm long screws more dorsally.  The screws were then tightened sequentially using the available torque wrench and  the plate was tightened in his dynamic distracted position.  With this final radiographs were obtained after removal of the retractor.  Wound was inspected carefully and it was noted that there was an opening into the pleural space.  After placing several stay sutures in the outer fascia a red Robinson catheter was passed in the system was irrigated and then the fluid was evacuated as best possible with the red Robinson catheter being evacuated and removed last.  The fascia was then tightened in several layers of 2-0 Vicryl were placed over this.  The final subcuticular closure was performed with 3-0 Vicryl.  Dermabond was placed on the skin.  Blood loss for the procedure was estimated at less than 25 cc.  The patient was then removed from the lateral decubitus position and placed supine extubated and returned to recovery room in stable condition.

## 2023-08-26 NOTE — Anesthesia Postprocedure Evaluation (Signed)
Anesthesia Post Note  Patient: MERREL FERRAIOLI  Procedure(s) Performed: Extreme Lateral INTERBODY FUSION LUMBAR ONE-TWO     Patient location during evaluation: PACU Anesthesia Type: General Level of consciousness: awake and alert Pain management: pain level controlled Vital Signs Assessment: post-procedure vital signs reviewed and stable Respiratory status: spontaneous breathing, nonlabored ventilation, respiratory function stable and patient connected to face mask oxygen Cardiovascular status: blood pressure returned to baseline and stable Postop Assessment: no apparent nausea or vomiting Anesthetic complications: no   There were no known notable events for this encounter.  Last Vitals:  Vitals:   08/26/23 1530 08/26/23 1617  BP: 135/81 (!) 150/75  Pulse: 74 79  Resp: (!) 21 20  Temp: (!) 36.4 C 36.7 C  SpO2: 95% 95%    Last Pain:  Vitals:   08/26/23 1636  TempSrc:   PainSc: 8                  Mariann Barter

## 2023-08-26 NOTE — H&P (Signed)
[name] [name] is an 77 y.o. male.   Chief Complaint: Back and bilateral lower extremity pain HPI: [name] [name] is a 77 year old individual whose had significant chronic back pain and leg pain he has had significant stenosis at multiple levels including L3-4 and L4-5 where he had previous laminectomies for decompression.  He has evolved a significant degenerative spondylosis with stenosis at the level of L1-L2 secondary to advanced disc disease with Modic changes and a broad-based protrusion of the disc at that level there is also marked facet hypertrophy posteriorly.  After careful consideration of his options I advised an anterolateral decompression using an XLIF technique and lateral plate fixation.  Past Medical History:  Diagnosis Date   Anemia    Arthritis    Asthma    with exertion   Back pain    CAD (coronary artery disease)    a. s/p STEMI in 2004 with stenting to LAD and LCx b. low-risk NST in 2015   Cataracts, bilateral    immature   Colitis    just finished one antibiotic and Cipro will be finished up   COPD (chronic obstructive pulmonary disease) (HCC)    no meds required   Coronary artery disease    Diverticulosis    GERD (gastroesophageal reflux disease)    takes Omeprazole daily   Hemorrhoids    History of bronchitis    many yrs ago   History of colon polyps    Hyperlipidemia    takes Simvastatin nightly   Hypertension    takes Losartan and Metoprolol daily   Joint pain    Myocardial infarct (HCC) 09/17/2002   Paget disease of bone    takes Fosamax daily   Shortness of breath    URI (upper respiratory infection)    Dec 31,2014   Vertigo    takes Meclizine daily as needed    Past Surgical History:  Procedure Laterality Date   ABDOMINAL EXPLORATION SURGERY     APPENDECTOMY     BACK SURGERY     x 2   bilateral knee arthroscopies     x 3   CARPAL TUNNEL RELEASE Right    COLONOSCOPY     COLONOSCOPY WITH PROPOFOL N/A 01/23/2022   Procedure: COLONOSCOPY  WITH PROPOFOL;  Surgeon: Dolores Frame, MD;  Location: [facility];  Service: Gastroenterology;  Laterality: N/A;  10:10 ASA 2   CORONARY ANGIOPLASTY     3 stents   DENTAL SURGERY     ESOPHAGOGASTRODUODENOSCOPY     HERNIA REPAIR     inguinal   JOINT REPLACEMENT Left    LEFT HEART CATHETERIZATION WITH CORONARY ANGIOGRAM N/A 08/27/2011   Procedure: LEFT HEART CATHETERIZATION WITH CORONARY ANGIOGRAM;  Surgeon: Herby Abraham, MD;  Location: [facility] CATH [facility];  Service: Cardiovascular;  Laterality: N/A;   LUMBAR LAMINECTOMY WITH COFLEX 2 LEVEL N/A 01/03/2015   Procedure: Lumbar three-four,Lumbar four-five Laminectomy with Coflex;  Surgeon: Barnett Abu, MD;  Location: [facility];  Service: Neurosurgery;  Laterality: N/A;   POLYPECTOMY  01/23/2022   Procedure: POLYPECTOMY;  Surgeon: Dolores Frame, MD;  Location: [facility];  Service: Gastroenterology;;   TOTAL KNEE ARTHROPLASTY Right 11/16/2013   DR Turner Daniels   TOTAL KNEE ARTHROPLASTY Right 11/16/2013   Procedure: TOTAL KNEE ARTHROPLASTY;  Surgeon: Nestor Lewandowsky, MD;  Location: [facility] OR;  Service: Orthopedics;  Laterality: Right;   TOTAL KNEE ARTHROPLASTY Left 01/12/02    Family History  Problem Relation Age of Onset   Heart  attack Mother    Diabetes Mother    Emphysema Father    Atrial fibrillation Sister    Heart attack Brother    Heart attack Brother    Heart disease Brother    Breast cancer Sister    Social History:  reports that he quit smoking about 24 years ago. His smoking use included cigarettes. He started smoking about 65 years ago. He has never used smokeless tobacco. He reports that he does not currently use alcohol. He reports that he does not use drugs.  Allergies:  Allergies  Allergen Reactions   Codeine Nausea And Vomiting    Medications Prior to Admission  Medication Sig Dispense Refill   acetaminophen (TYLENOL) 325 MG tablet Take 650 mg by mouth every 6 (six) hours as needed for moderate pain.      albuterol (PROVENTIL) (2.5 MG/3ML) 0.083% nebulizer solution Take 2.5 mg by nebulization every 6 (six) hours as needed for wheezing or shortness of breath.     aspirin EC 325 MG tablet Take 325 mg by mouth daily with lunch.     cetirizine (ZYRTEC) 10 MG tablet Take 10 mg by mouth daily.     HYDROcodone-acetaminophen (NORCO/VICODIN) 5-325 MG tablet Take 1 tablet by mouth every 6 (six) hours as needed for moderate pain (pain score 4-6).     losartan (COZAAR) 100 MG tablet TAKE 1 TABLET BY MOUTH DAILY 90 tablet 2   methocarbamol (ROBAXIN) 500 MG tablet Take 500 mg by mouth 4 (four) times daily as needed.     metoprolol tartrate (LOPRESSOR) 25 MG tablet Take 0.5 tablets (12.5 mg total) by mouth 2 (two) times daily. 90 tablet 1   Multiple Vitamin (MULTIVITAMIN) capsule Take 1 capsule by mouth daily in the afternoon. (Patient not taking: Reported on 08/20/2023)     nitroGLYCERIN (NITROSTAT) 0.4 MG SL tablet Place 1 tablet (0.4 mg total) under the tongue every 5 (five) minutes as needed. Chest pain 25 tablet 3   omeprazole (PRILOSEC) 40 MG capsule Take 40 mg by mouth daily.     simvastatin (ZOCOR) 40 MG tablet Take 1 tablet (40 mg total) by mouth at bedtime. 90 tablet 1   albuterol (PROVENTIL HFA;VENTOLIN HFA) 108 (90 Base) MCG/ACT inhaler Inhale 2 puffs into the lungs every 6 (six) hours as needed for wheezing or shortness of breath.     benzonatate (TESSALON) 100 MG capsule Take 1 capsule (100 mg total) by mouth every 8 (eight) hours. (Patient taking differently: Take 100 mg by mouth 3 (three) times daily as needed for cough.) 21 capsule 0    No results found for this or any previous visit (from the past 48 hour(s)). No results found.  Review of Systems  Constitutional:  Positive for activity change.  Musculoskeletal:  Positive for back pain, gait problem and myalgias.  Neurological:  Positive for weakness and numbness.  All other systems reviewed and are negative.   Blood pressure (!) 150/83,  pulse 73, temperature 97.9 F (36.6 C), temperature source Oral, resp. rate 18, height 5\' 10"  (1.778 m), weight 93 kg, SpO2 94%. Physical Exam Constitutional:      Appearance: Normal appearance.  HENT:     Head: Normocephalic and atraumatic.     Right Ear: Tympanic membrane, ear canal and external ear normal.     Left Ear: Tympanic membrane, ear canal and external ear normal.     Nose: Nose normal.     Mouth/Throat:     Mouth: Mucous membranes are moist.  Pharynx: Oropharynx is clear.  Eyes:     Extraocular Movements: Extraocular movements intact.     Conjunctiva/sclera: Conjunctivae normal.     Pupils: Pupils are equal, round, and reactive to light.  Cardiovascular:     Rate and Rhythm: Normal rate and regular rhythm.     Pulses: Normal pulses.     Heart sounds: Normal heart sounds.  Pulmonary:     Effort: Pulmonary effort is normal.     Breath sounds: Normal breath sounds.  Abdominal:     General: Abdomen is flat. Bowel sounds are normal.     Palpations: Abdomen is soft.  Musculoskeletal:     Cervical back: Normal range of motion.     Comments: Positive straight leg raising bilaterally 30 degrees Patrick's maneuver is negative  Skin:    General: Skin is warm and dry.     Capillary Refill: Capillary refill takes less than 2 seconds.  Neurological:     Mental Status: He is alert.     Comments: Mild weakness in iliopsoas and quadriceps bilaterally at 4 out of 5.  Absent reflexes in the patella and the Achilles bilaterally.  Cranial nerve examination is normal.  Gait is moderately wide-based.  Upper extremity strength is normal  Psychiatric:        Mood and Affect: Mood normal.        Behavior: Behavior normal.        Thought Content: Thought content normal.        Judgment: Judgment normal.      Assessment/Plan Spondylosis and stenosis L1-L2.  History of diffuse lumbar degenerative changes elsewhere in the lumbar spine.  Plan: Anterolateral decompression using XLIF  technique at L1-L2 with lateral plate fixation.  Stefani Dama, MD 08/26/2023, 10:51 AM

## 2023-08-26 NOTE — Progress Notes (Signed)
Orthopedic Tech Progress Note Patient Details:  [name] [name] 15-Sep-1946 [id number]  Ortho Devices Type of Ortho Device: Lumbar corsett Ortho Device/Splint Location: BAck Ortho Device/Splint Interventions: Ordered, Adjustment      [name] [name] 08/26/2023, 7:07 PM

## 2023-08-26 NOTE — Plan of Care (Signed)
Patient came to floor with a lot of pleural pain related to know pneumo. Rechecked and cxr with no worsening and pain managed with Oxy well.  Patient resting and comfortable.

## 2023-08-26 NOTE — Transfer of Care (Signed)
Immediate Anesthesia Transfer of Care Note  Patient: [name] [name]  Procedure(s) Performed: ANTERIOR LATERAL LUMBAR INTERBODY FUSION LUMBAR ONE-TWO  Patient Location: PACU  Anesthesia Type:General  Level of Consciousness: awake, alert , and patient cooperative  Airway & Oxygen Therapy: Patient Spontanous Breathing and Patient connected to face mask oxygen  Post-op Assessment: Report given to RN and Post -op Vital signs reviewed and stable  Post vital signs: Reviewed and stable  Last Vitals:  Vitals Value Taken Time  BP 137/66 08/26/23 1400  Temp    Pulse 78 08/26/23 1403  Resp 18 08/26/23 1403  SpO2 94 % 08/26/23 1403  Vitals shown include unfiled device data.  Last Pain:  Vitals:   08/26/23 0905  TempSrc:   PainSc: 7       Patients Stated Pain Goal: 0 (08/26/23 0905)  Complications: No notable events documented.

## 2023-08-26 NOTE — Anesthesia Procedure Notes (Signed)
Procedure Name: Intubation Date/Time: 08/26/2023 11:45 AM  Performed by: Allyn Kenner, CRNAPre-anesthesia Checklist: Patient identified, Emergency Drugs available, Suction available and Patient being monitored Patient Re-evaluated:Patient Re-evaluated prior to induction Oxygen Delivery Method: Circle System Utilized Preoxygenation: Pre-oxygenation with 100% oxygen Induction Type: IV induction Ventilation: Oral airway inserted - appropriate to patient size and Two handed mask ventilation required Laryngoscope Size: Miller and 2 Grade View: Grade I Tube type: Oral Tube size: 7.5 mm Number of attempts: 1 Airway Equipment and Method: Stylet and Oral airway Placement Confirmation: ETT inserted through vocal cords under direct vision, positive ETCO2 and breath sounds checked- equal and bilateral Secured at: 23 cm Tube secured with: Tape Dental Injury: Teeth and Oropharynx as per pre-operative assessment  Comments: Performed by Carroll Sage

## 2023-08-27 ENCOUNTER — Encounter (HOSPITAL_COMMUNITY): Payer: Self-pay | Admitting: Neurological Surgery

## 2023-08-27 ENCOUNTER — Inpatient Hospital Stay (HOSPITAL_COMMUNITY): Payer: Medicare Other

## 2023-08-27 MED ORDER — DEXAMETHASONE 2 MG PO TABS
2.0000 mg | ORAL_TABLET | Freq: Two times a day (BID) | ORAL | Status: DC
  Administered 2023-08-27 – 2023-08-30 (×6): 2 mg via ORAL
  Filled 2023-08-27 (×7): qty 1

## 2023-08-27 NOTE — Evaluation (Signed)
Physical Therapy Evaluation Patient Details Name: [name] [name] MRN: [id number] DOB: 08-02-1946 Today's Date: 08/27/2023  History of Present Illness  Pt is a 77 y/o male presenting on 12/9 for anterolateral decompression using XLIF technique at L1-2 with lateral plate fixation. PMH includes: anemia, CAD, colitis, COPD, CAD, HTN, MI, paget disease of bone, URI, vertigo, prior back surgery, bil TKA.  Clinical Impression  PTA, pt lives with his spouse and is a limited community ambulator using a cane. Pt presents with decreased functional mobility secondary to pain and decreased cardiopulmonary endurance. Pt presents with 5/5 BLE strength and denies radicular symptoms or numbness/tingling. Ambulating 60 ft with a walker at a CGA level. SpO2 94% on 3L O2, HR stable in 70's. Education provided regarding spinal precautions and brace use. Suspect steady progress with resolution of pneumothorax and pain control. Will continue to follow acutely.        If plan is discharge home, recommend the following: A little help with bathing/dressing/bathroom;Assistance with cooking/housework;Assist for transportation;Help with stairs or ramp for entrance   Can travel by private vehicle        Equipment Recommendations Rolling walker (2 wheels)  Recommendations for Other Services       Functional Status Assessment Patient has had a recent decline in their functional status and demonstrates the ability to make significant improvements in function in a reasonable and predictable amount of time.     Precautions / Restrictions Precautions Precautions: Fall;Back Precaution Booklet Issued: Yes (comment) Precaution Comments: Verbally reviewed, provided written handout Required Braces or Orthoses: Spinal Brace Spinal Brace: Lumbar corset;Applied in sitting position Restrictions Weight Bearing Restrictions: No      Mobility  Bed Mobility Overal bed mobility: Needs Assistance Bed Mobility: Rolling,  Sidelying to Sit Rolling: Modified independent (Device/Increase time) Sidelying to sit: Supervision       General bed mobility comments: Cues for log roll technique, use of rail, no physical assist required. HOB elevated (pt with adjustable bed at home)    Transfers Overall transfer level: Needs assistance Equipment used: Rolling walker (2 wheels) Transfers: Sit to/from Stand Sit to Stand: Contact guard assist                Ambulation/Gait Ambulation/Gait assistance: Contact guard assist Gait Distance (Feet): 60 Feet Assistive device: Rolling walker (2 wheels) Gait Pattern/deviations: Step-through pattern, Decreased stride length Gait velocity: decerased     General Gait Details: Verbal cues for walker use, activity pacing, CGA for safety. No gross unsteadiness noted  Stairs            Wheelchair Mobility     Tilt Bed    Modified Rankin (Stroke Patients Only)       Balance Overall balance assessment: Needs assistance Sitting-balance support: Feet supported Sitting balance-Leahy Scale: Good     Standing balance support: Bilateral upper extremity supported Standing balance-Leahy Scale: Poor Standing balance comment: reliant on RW                             Pertinent Vitals/Pain Pain Assessment Pain Assessment: 0-10 Pain Score: 10-Worst pain ever Pain Location: chest (known pneumothorax), L lateral low back Pain Descriptors / Indicators: Grimacing, Operative site guarding, Spasm Pain Intervention(s): Limited activity within patient's tolerance, Monitored during session, Premedicated before session    Home Living Family/patient expects to be discharged to:: Private residence Living Arrangements: Spouse/significant other Available Help at Discharge: Family Type of Home: House Home Access: Stairs to  enter Entrance Stairs-Rails: Right Entrance Stairs-Number of Steps: 4   Home Layout: Able to live on main level with  bedroom/bathroom Home Equipment: Shower seat - built in;Standard Theme park manager - single point;BSC/3in1      Prior Function Prior Level of Function : Independent/Modified Independent             Mobility Comments: using cane, limited community ambulator requires frequent sitting rest breaks ADLs Comments: mostly independent ADL's, pt spouse sometimes assists with washing back     Extremity/Trunk Assessment   Upper Extremity Assessment Upper Extremity Assessment: Defer to OT evaluation    Lower Extremity Assessment Lower Extremity Assessment: RLE deficits/detail;LLE deficits/detail RLE Deficits / Details: Strength 5/5 LLE Deficits / Details: Strength 5/5    Cervical / Trunk Assessment Cervical / Trunk Assessment: Back Surgery  Communication   Communication Communication: No apparent difficulties  Cognition Arousal: Alert Behavior During Therapy: WFL for tasks assessed/performed Overall Cognitive Status: Within Functional Limits for tasks assessed                                          General Comments      Exercises     Assessment/Plan    PT Assessment Patient needs continued PT services  PT Problem List Decreased strength;Decreased activity tolerance;Decreased balance;Decreased mobility;Cardiopulmonary status limiting activity;Pain       PT Treatment Interventions DME instruction;Gait training;Stair training;Functional mobility training;Therapeutic activities;Therapeutic exercise;Balance training;Patient/family education    PT Goals (Current goals can be found in the Care Plan section)  Acute Rehab PT Goals Patient Stated Goal: less pain PT Goal Formulation: With patient Time For Goal Achievement: 09/10/23 Potential to Achieve Goals: Good    Frequency Min 5X/week     Co-evaluation               AM-PAC PT "6 Clicks" Mobility  Outcome Measure Help needed turning from your back to your side while in a flat bed without using  bedrails?: None Help needed moving from lying on your back to sitting on the side of a flat bed without using bedrails?: A Little Help needed moving to and from a bed to a chair (including a wheelchair)?: A Little Help needed standing up from a chair using your arms (e.g., wheelchair or bedside chair)?: A Little Help needed to walk in hospital room?: A Little Help needed climbing 3-5 steps with a railing? : A Little 6 Click Score: 19    End of Session Equipment Utilized During Treatment: Gait belt;Back brace;Oxygen Activity Tolerance: Patient tolerated treatment well Patient left: in chair;with call bell/phone within reach;with family/visitor present Nurse Communication: Mobility status PT Visit Diagnosis: Pain;Difficulty in walking, not elsewhere classified (R26.2) Pain - part of body:  (back)    Time: 1610-9604 PT Time Calculation (min) (ACUTE ONLY): 35 min   Charges:   PT Evaluation $PT Eval Low Complexity: 1 Low PT Treatments $Therapeutic Activity: 8-22 mins PT General Charges $$ ACUTE PT VISIT: 1 Visit         Lillia Pauls, PT, DPT Acute Rehabilitation Services Office [phone]   Norval Morton 08/27/2023, 1:07 PM

## 2023-08-27 NOTE — Progress Notes (Addendum)
S/p Anterolateral decompression L1-L2 , 12/9   08/27/23 0730  TOC Brief Assessment  Insurance and Status Reviewed  Patient has primary care physician Yes  Home environment has been reviewed From home with wife  Prior level of function: PTA independent with ADL's. Has BSC @ home and walker without wheels.  Prior/Current Home Services No current home services  Social Determinants of Health Reivew SDOH reviewed no interventions necessary  Readmission risk has been reviewed No  Transition of care needs transition of care needs identified, TOC will continue to follow (s/p Anterolateral decompression L1-L2, 12/9. PT/OT evaluations pending)   Agreeable to home health services if needed. Pt without provider preference as long as provider is in net work with insurance. Pt without transportation issues or RX med concerns.  Gae Gallop RN,BSN,CM [phone]

## 2023-08-27 NOTE — Progress Notes (Signed)
Patient ID: [name] [name], male   DOB: March 29, 1946, 77 y.o.   MRN: [id number] Vital signs are stable.  Patient is still on a couple liters of oxygen.  Chest x-ray shows he has some low lung volumes Cabbell atelectasis.  Patient is encouraged to use incentive spirometer.  Ambulation is also encouraged.  He is having significant pain when he stands up at first.  I will place him on some low-dose Decadron.  In my absence I have asked Dr. Lovell Sheehan to see the patient if he improves to independent functioning he can be discharged tomorrow or the next day.

## 2023-08-27 NOTE — Evaluation (Signed)
Occupational Therapy Evaluation Patient Details Name: [name] [name] MRN: [id number] DOB: 1946-03-16 Today's Date: 08/27/2023   History of Present Illness Pt is a 77 y/o male presenting on 12/9 for anterolateral decompression using XLIF technique at L1-2 with lateral plate fixation. PMH includes: anemia, CAD, colitis, COPD, CAD, HTN, MI, paget disease of bone, URI, vertigo, prior back surgery, bil TKA.   Clinical Impression   PTA patient reports managing ADLs with independence, mobility using cane.  Admitted for above and limited by problem list below.  He requires cueing to recall back precautions (initially recalling 1/3, end of session 2/3 without cueing), handout in room; but good adherence to precautions functionally.  He requires setup for UB ADLs, max assist for LB Adls, and min guard for transfers/short distance mobility using RW in room.  He plans to have spouse assist with LB ADLs, and discussed recommendations for [place] at home (as shower has a corner seat).  Ancipitate pt will progress well, no further OT needs required after dc home.  Will follow acutely.     If plan is discharge home, recommend the following: A little help with walking and/or transfers;A lot of help with bathing/dressing/bathroom;Assistance with cooking/housework;Help with stairs or ramp for entrance;Assist for transportation    Functional Status Assessment  Patient has had a recent decline in their functional status and demonstrates the ability to make significant improvements in function in a reasonable and predictable amount of time.  Equipment Recommendations  Tub/shower seat    Recommendations for Other Services       Precautions / Restrictions Precautions Precautions: Fall;Back Precaution Booklet Issued: Yes (comment) Precaution Comments: Verbally reviewed, provided written handout Required Braces or Orthoses: Spinal Brace Spinal Brace: Lumbar corset;Applied in sitting position Restrictions Weight  Bearing Restrictions: No      Mobility Bed Mobility Overal bed mobility: Needs Assistance Bed Mobility: Rolling, Sidelying to Sit Rolling: Modified independent (Device/Increase time) Sidelying to sit: Contact guard assist       General bed mobility comments: cueing for technique, min guard for trunk support with increased time.  HOB elevated    Transfers Overall transfer level: Needs assistance Equipment used: Rolling walker (2 wheels) Transfers: Sit to/from Stand Sit to Stand: Contact guard assist           General transfer comment: cueing for hand placement      Balance Overall balance assessment: Needs assistance Sitting-balance support: Feet supported Sitting balance-Leahy Scale: Good     Standing balance support: Bilateral upper extremity supported Standing balance-Leahy Scale: Poor Standing balance comment: reliant on RW                           ADL either performed or assessed with clinical judgement   ADL Overall ADL's : Needs assistance/impaired     Grooming: Set up;Sitting           Upper Body Dressing : Set up;Sitting   Lower Body Dressing: Sit to/from stand;Maximal assistance Lower Body Dressing Details (indicate cue type and reason): unable to complete figure 4 technique, spouse plans to assist.  min guard in standing with BUE support Toilet Transfer: Contact guard assist;Ambulation;Rolling walker (2 wheels) Toilet Transfer Details (indicate cue type and reason): RW simulated to recliner         Functional mobility during ADLs: Contact guard assist;Rolling walker (2 wheels);Cueing for safety;Cueing for sequencing       Vision   Vision Assessment?: No apparent visual deficits  Perception         Praxis         Pertinent Vitals/Pain Pain Assessment Pain Assessment: Faces Faces Pain Scale: Hurts even more Pain Location: chest (known pneumothorax), L lateral low back Pain Descriptors / Indicators: Grimacing,  Operative site guarding, Spasm Pain Intervention(s): Limited activity within patient's tolerance, Monitored during session, Repositioned, Premedicated before session     Extremity/Trunk Assessment Upper Extremity Assessment Upper Extremity Assessment: LUE deficits/detail;Generalized weakness LUE Deficits / Details: L shoulder ROM limited to 45* at baseline LUE Coordination: decreased gross motor   Lower Extremity Assessment Lower Extremity Assessment: Defer to PT evaluation RLE Deficits / Details: Strength 5/5 LLE Deficits / Details: Strength 5/5   Cervical / Trunk Assessment Cervical / Trunk Assessment: Back Surgery   Communication Communication Communication: No apparent difficulties   Cognition Arousal: Alert Behavior During Therapy: WFL for tasks assessed/performed Overall Cognitive Status: Within Functional Limits for tasks assessed                                       General Comments  spouse at side and supportive; pt on 3L     Exercises     Shoulder Instructions      Home Living Family/patient expects to be discharged to:: Private residence Living Arrangements: Spouse/significant other Available Help at Discharge: Family Type of Home: House Home Access: Stairs to enter Secretary/administrator of Steps: 4 Entrance Stairs-Rails: Right Home Layout: Able to live on main level with bedroom/bathroom     Bathroom Shower/Tub: Walk-in shower;Tub/shower unit   Bathroom Toilet: Handicapped height (BSC over toilet)     Home Equipment: Shower seat - built in;Standard Walker;Cane - single point;BSC/3in1   Additional Comments: built in seat is a Environmental education officer      Prior Functioning/Environment Prior Level of Function : Independent/Modified Independent             Mobility Comments: using cane, limited community ambulator requires frequent sitting rest breaks ADLs Comments: mostly independent ADL's, pt spouse sometimes assists with washing  back        OT Problem List: Decreased strength;Impaired balance (sitting and/or standing);Decreased activity tolerance;Decreased safety awareness;Decreased knowledge of use of DME or AE;Decreased knowledge of precautions;Pain;Obesity      OT Treatment/Interventions: Self-care/ADL training;Therapeutic exercise;DME and/or AE instruction;Energy conservation;Therapeutic activities;Patient/family education;Balance training    OT Goals(Current goals can be found in the care plan section) Acute Rehab OT Goals Patient Stated Goal: home/ less pain OT Goal Formulation: With patient Time For Goal Achievement: 09/10/23 Potential to Achieve Goals: Good  OT Frequency: Min 1X/week    Co-evaluation              AM-PAC OT "6 Clicks" Daily Activity     Outcome Measure Help from another person eating meals?: None Help from another person taking care of personal grooming?: A Little Help from another person toileting, which includes using toliet, bedpan, or urinal?: A Lot Help from another person bathing (including washing, rinsing, drying)?: A Lot Help from another person to put on and taking off regular upper body clothing?: A Little Help from another person to put on and taking off regular lower body clothing?: A Lot 6 Click Score: 16   End of Session Equipment Utilized During Treatment: Gait belt;Rolling walker (2 wheels);Back brace Nurse Communication: Mobility status  Activity Tolerance: Patient tolerated treatment well Patient left: in chair;with call bell/phone within reach;with  family/visitor present  OT Visit Diagnosis: Other abnormalities of gait and mobility (R26.89);Pain Pain - Right/Left: Left (chest/back)                Time: 0865-7846 OT Time Calculation (min): 27 min Charges:  OT General Charges $OT Visit: 1 Visit OT Evaluation $OT Eval Moderate Complexity: 1 Mod OT Treatments $Self Care/Home Management : 8-22 mins  Barry Brunner, OT Acute Rehabilitation  Services Office [phone]   Chancy Milroy 08/27/2023, 1:28 PM

## 2023-08-27 NOTE — Progress Notes (Signed)
Patient ID: [name] [name], male   DOB: 10/05/1945, 77 y.o.   MRN: [id number] Vital signs are stable.  Patient is still on oxygen on telemetry.  He has not been out of bed.  I have advised that we should discontinue the cardiac monitoring and mobilize the patient.  Will obtain a chest x-ray in department AP and lateral.  Yesterday evening's chest x-ray looked better.  Will see how he does ambulating about today.  I will check on him this afternoon.

## 2023-08-27 NOTE — Plan of Care (Signed)

## 2023-08-28 ENCOUNTER — Inpatient Hospital Stay (HOSPITAL_COMMUNITY): Payer: Medicare Other

## 2023-08-28 ENCOUNTER — Other Ambulatory Visit: Payer: Self-pay

## 2023-08-28 DIAGNOSIS — R0789 Other chest pain: Secondary | ICD-10-CM | POA: Diagnosis not present

## 2023-08-28 LAB — TROPONIN I (HIGH SENSITIVITY)
Troponin I (High Sensitivity): 9 ng/L (ref <18)
Troponin I (High Sensitivity): 6 ng/L (ref <18)

## 2023-08-28 NOTE — Progress Notes (Signed)
Occupational Therapy Treatment Patient Details Name: [name] [name] MRN: [id number] DOB: 1946/01/24 Today's Date: 08/28/2023   History of present illness Pt is a 77 y/o male presenting on 12/9 for anterolateral decompression using XLIF technique at L1-2 with lateral plate fixation. PMH includes: anemia, CAD, colitis, COPD, CAD, HTN, MI, paget disease of bone, URI, vertigo, prior back surgery, bil TKA.   OT comments  Patient agreeable to OT, reports pain has improved but can now feel incisional pain from surgery.  He completes transfers with min guard, cueing for hand placement on RW. Min guard for functional mobility. Min cueing to adhere to precautions, specifically twisting during Adls. Simulated shower transfers with min guard using RW.  Spouse supportive and can assist as needed. Will follow acutely, no needs after dc home.       If plan is discharge home, recommend the following:  A little help with walking and/or transfers;Assistance with cooking/housework;Help with stairs or ramp for entrance;Assist for transportation;A little help with bathing/dressing/bathroom   Equipment Recommendations  Tub/shower seat    Recommendations for Other Services      Precautions / Restrictions Precautions Precautions: Fall;Back Precaution Booklet Issued: Yes (comment) Precaution Comments: Verbally reviewed, provided written handout Required Braces or Orthoses: Spinal Brace Spinal Brace: Lumbar corset;Applied in sitting position Restrictions Weight Bearing Restrictions: No       Mobility Bed Mobility               General bed mobility comments: OOB in recliner    Transfers Overall transfer level: Needs assistance Equipment used: Rolling walker (2 wheels) Transfers: Sit to/from Stand Sit to Stand: Contact guard assist           General transfer comment: cueing for hand placement     Balance Overall balance assessment: Needs assistance Sitting-balance support: Feet  supported Sitting balance-Leahy Scale: Good     Standing balance support: Bilateral upper extremity supported Standing balance-Leahy Scale: Poor Standing balance comment: reliant on RW, able to stand briefly with 0-1 hand support during ADLs and min guard                           ADL either performed or assessed with clinical judgement   ADL Overall ADL's : Needs assistance/impaired     Grooming: Supervision/safety;Standing;Wash/dry hands Grooming Details (indicate cue type and reason): cueing for posture             Lower Body Dressing: Moderate assistance;Sit to/from stand Lower Body Dressing Details (indicate cue type and reason): relies on assist for socks, reviewed techniques but sposue plans to assist initally Toilet Transfer: Contact guard assist;Ambulation;Rolling walker (2 wheels) Toilet Transfer Details (indicate cue type and reason): 3:1 over commode     Tub/ Shower Transfer: Walk-in shower;Contact guard assist;Ambulation;Rolling walker (2 wheels);Shower Field seismologist Details (indicate cue type and reason): simulated in room, reverese step transfer Functional mobility during ADLs: Contact guard assist;Rolling walker (2 wheels)      Extremity/Trunk Assessment              Vision       Perception     Praxis      Cognition Arousal: Alert Behavior During Therapy: WFL for tasks assessed/performed Overall Cognitive Status: Within Functional Limits for tasks assessed  Exercises      Shoulder Instructions       General Comments spouse at side and supportive    Pertinent Vitals/ Pain       Pain Assessment Pain Assessment: Faces Faces Pain Scale: Hurts a little bit Pain Location: incisional Pain Descriptors / Indicators: Grimacing, Operative site guarding, Spasm Pain Intervention(s): Limited activity within patient's tolerance, Monitored during session,  Repositioned  Home Living                                          Prior Functioning/Environment              Frequency  Min 1X/week        Progress Toward Goals  OT Goals(current goals can now be found in the care plan section)  Progress towards OT goals: Progressing toward goals  Acute Rehab OT Goals Patient Stated Goal: home OT Goal Formulation: With patient Time For Goal Achievement: 09/10/23 Potential to Achieve Goals: Good  Plan      Co-evaluation                 AM-PAC OT "6 Clicks" Daily Activity     Outcome Measure   Help from another person eating meals?: None Help from another person taking care of personal grooming?: A Little Help from another person toileting, which includes using toliet, bedpan, or urinal?: A Little Help from another person bathing (including washing, rinsing, drying)?: A Lot Help from another person to put on and taking off regular upper body clothing?: A Little Help from another person to put on and taking off regular lower body clothing?: A Lot 6 Click Score: 17    End of Session Equipment Utilized During Treatment: Gait belt;Rolling walker (2 wheels);Back brace  OT Visit Diagnosis: Other abnormalities of gait and mobility (R26.89);Pain Pain - Right/Left: Left (incisional)   Activity Tolerance Patient tolerated treatment well   Patient Left in chair;with call bell/phone within reach;with family/visitor present   Nurse Communication Mobility status        Time: 6440-3474 OT Time Calculation (min): 22 min  Charges: OT General Charges $OT Visit: 1 Visit OT Treatments $Self Care/Home Management : 8-22 mins  Barry Brunner, OT Acute Rehabilitation Services Office [phone]   Chancy Milroy 08/28/2023, 1:49 PM

## 2023-08-28 NOTE — Plan of Care (Signed)
  Problem: Education: Goal: Knowledge of General Education information will improve Description: Including pain rating scale, medication(s)/side effects and non-pharmacologic comfort measures Outcome: Progressing   Problem: Health Behavior/Discharge Planning: Goal: Ability to manage health-related needs will improve Outcome: Progressing   Problem: Clinical Measurements: Goal: Ability to maintain clinical measurements within normal limits will improve Outcome: Progressing   Problem: Clinical Measurements: Goal: Diagnostic test results will improve Outcome: Progressing   Problem: Coping: Goal: Level of anxiety will decrease Outcome: Progressing   Problem: Pain Management: Goal: Pain level will decrease Outcome: Progressing

## 2023-08-28 NOTE — Significant Event (Signed)
Rapid Response Event Note   Reason for Call :  Chest pain   Initial Focused Assessment:  Patient A&Ox4, complaining of midsternal sharp chest pain 10/10, worse with taking breath in. Pain started suddenly when patient attempting to turn to his side. Skin warm/dry.   146/83 (101) HR 69 RR 22 O2 93% 2L  Interventions:  EKG already completed Nitroglycerin SL 0.4mg  x2 given PTA PRN pain med and muscle relaxer given IS 2200 achieved sufficiently  Plan of Care:  Assistance with movement, PRN medications, address with MD for pain management.   Event Summary:  MD Notified: per primary RN Call Time: 0719 Arrival Time: 1610 End Time: 0740  Truddie Crumble, RN

## 2023-08-28 NOTE — Progress Notes (Signed)
Pt complains of chest pain 20/10 and describes pain as a sharp pain. Per patient, pain started when he was moving to his right side. Patient in visible distress and 02 sat ranging in the upper 80's to lower 90's. Patient put on 2L of 02 and MD Jenkins notified. EKG and vital signs obtained. Patient on cardiac monitoring and 2 doses of nitroglycerin given to patient.   Patient rates pain 8/10 after doses of nitroglycerin.  Rapid response RN called and arrived at bedside.   Patient assessed by rapid nurse and new order received. Dilaudid and robaxin given to patient per rapid RN recommendation. Patient rates pain a 5 and appears to be in less distress.

## 2023-08-28 NOTE — Consult Note (Signed)
Cardiology Consultation   Patient ID: [name] [name] MRN: [id number]; DOB: 18-Oct-1945  Admit date: 08/26/2023 Date of Consult: 08/28/2023  PCP:  Lindaann Slough, DO   [facility] Providers Cardiologist:  Dina Rich, MD      Patient Profile:   [name] [name] is a 77 y.o. male with a hx of CAD s/p STEMI with LAD/Lcx '04, HTN, HLD, tobacco use, COPD, subdural hematoma after fall, CVA who is being seen 08/28/2023 for the evaluation of chest pain at the request of Dr. Lovell Sheehan.  History of Present Illness:   [name] [name] is a 77 yo male with PMH noted above. He has been followed by Dr. Dina Rich as an outpatient. Remote hx of CAD with stenting to the LAD and Lcx in 2004 in the setting of STEMI. Myoview in 01/2022 with findings consistent with prior inferior myocardial infarction, large fixed defect. He was last seen in the office on 03/2023 with Sharlene Dory, NP for follow up and reported elevated BP readings. It was recommended that he transition from losartan to valsartan. Had a pre-op telephone visit on 12/6 with Edd Fabian, NP in anticipation of upcoming lumbar surgery. He was felt to be appropriate to proceed with surgery.   Underwent anterolateral decompression L1-L2 with Dr. Danielle Dess on 12/9. Post op CXR with small left apical pneumothorax, which resolved on repeat films.   The morning of 12/11 he was turning over in the bed and developed sudden onset of severe left sided stabbing chest pain. O2 sats dropped. He was given SL NTG along with dilaudid and robaxin. Symptoms lingered for several hours and then resolved. EKG showed sinus rhythm, 1st degree AVB with no acute ST/T wave changes. hsTn neg x2. In talking with patient he reports symptoms were different than what he experienced with his remote stenting back in 2004. Wife also reports he had significant post op pain in his chest after getting to his room Monday night that was relieved with pain medications. He  says this was the same pain.   He has since been stable to get out of bed and ambulate in the hallway with PT without recurrent chest pain. Does have mild soreness in the chest, slightly tender to palpation.   Past Medical History:  Diagnosis Date   Anemia    Arthritis    Asthma    with exertion   Back pain    CAD (coronary artery disease)    a. s/p STEMI in 2004 with stenting to LAD and LCx b. low-risk NST in 2015   Cataracts, bilateral    immature   Colitis    just finished one antibiotic and Cipro will be finished up   COPD (chronic obstructive pulmonary disease) (HCC)    no meds required   Coronary artery disease    Diverticulosis    GERD (gastroesophageal reflux disease)    takes Omeprazole daily   Hemorrhoids    History of bronchitis    many yrs ago   History of colon polyps    Hyperlipidemia    takes Simvastatin nightly   Hypertension    takes Losartan and Metoprolol daily   Joint pain    Myocardial infarct (HCC) 09/17/2002   Paget disease of bone    takes Fosamax daily   Shortness of breath    URI (upper respiratory infection)    Dec 31,2014   Vertigo    takes Meclizine daily as needed    Past Surgical History:  Procedure  Laterality Date   ABDOMINAL EXPLORATION SURGERY     ANTERIOR LAT LUMBAR FUSION N/A 08/26/2023   Procedure: Extreme Lateral INTERBODY FUSION LUMBAR ONE-TWO;  Surgeon: Barnett Abu, MD;  Location: [facility] OR;  Service: Neurosurgery;  Laterality: N/A;  C3   APPENDECTOMY     BACK SURGERY     x 2   bilateral knee arthroscopies     x 3   CARPAL TUNNEL RELEASE Right    COLONOSCOPY     COLONOSCOPY WITH PROPOFOL N/A 01/23/2022   Procedure: COLONOSCOPY WITH PROPOFOL;  Surgeon: Dolores Frame, MD;  Location: [facility];  Service: Gastroenterology;  Laterality: N/A;  10:10 ASA 2   CORONARY ANGIOPLASTY     3 stents   DENTAL SURGERY     ESOPHAGOGASTRODUODENOSCOPY     HERNIA REPAIR     inguinal   JOINT REPLACEMENT Left    LEFT HEART  CATHETERIZATION WITH CORONARY ANGIOGRAM N/A 08/27/2011   Procedure: LEFT HEART CATHETERIZATION WITH CORONARY ANGIOGRAM;  Surgeon: Herby Abraham, MD;  Location: [facility] CATH LAB;  Service: Cardiovascular;  Laterality: N/A;   LUMBAR LAMINECTOMY WITH COFLEX 2 LEVEL N/A 01/03/2015   Procedure: Lumbar three-four,Lumbar four-five Laminectomy with Coflex;  Surgeon: Barnett Abu, MD;  Location: [facility] NEURO ORS;  Service: Neurosurgery;  Laterality: N/A;   POLYPECTOMY  01/23/2022   Procedure: POLYPECTOMY;  Surgeon: Dolores Frame, MD;  Location: [facility];  Service: Gastroenterology;;   TOTAL KNEE ARTHROPLASTY Right 11/16/2013   DR Turner Daniels   TOTAL KNEE ARTHROPLASTY Right 11/16/2013   Procedure: TOTAL KNEE ARTHROPLASTY;  Surgeon: Nestor Lewandowsky, MD;  Location: [facility] OR;  Service: Orthopedics;  Laterality: Right;   TOTAL KNEE ARTHROPLASTY Left 01/12/02     Inpatient Medications: Scheduled Meds:  dexamethasone  2 mg Oral Q12H   docusate sodium  100 mg Oral BID   enoxaparin (LOVENOX) injection  40 mg Subcutaneous Q24H   loratadine  10 mg Oral Daily   losartan  100 mg Oral Daily   metoprolol tartrate  12.5 mg Oral BID   pantoprazole  80 mg Oral Daily   senna  1 tablet Oral BID   simvastatin  40 mg Oral QHS   sodium chloride flush  3 mL Intravenous Q12H   Continuous Infusions:  PRN Meds: acetaminophen **OR** acetaminophen, albuterol, benzonatate, bisacodyl, HYDROmorphone (DILAUDID) injection, menthol-cetylpyridinium **OR** phenol, methocarbamol **OR** methocarbamol (ROBAXIN) injection, nitroGLYCERIN, ondansetron **OR** ondansetron (ZOFRAN) IV, oxyCODONE-acetaminophen, polyethylene glycol, sodium chloride flush, sodium phosphate  Allergies:    Allergies  Allergen Reactions   Codeine Nausea And Vomiting    Social History:   Social History   Socioeconomic History   Marital status: Married    Spouse name: Not on file   Number of children: Not on file   Years of education: Not on file   Highest  education level: Not on file  Occupational History   Not on file  Tobacco Use   Smoking status: Former    Current packs/day: 0.00    Types: Cigarettes    Start date: 01/01/1958    Quit date: 09/17/1998    Years since quitting: 24.9   Smokeless tobacco: Never   Tobacco comments:    quit smoking in 2001  Vaping Use   Vaping status: Never Used  Substance and Sexual Activity   Alcohol use: Not Currently    Comment: occasionally wine   Drug use: No   Sexual activity: Yes  Other Topics Concern   Not on file  Social History  Narrative   Not on file   Social Determinants of Health   Financial Resource Strain: Low Risk  (05/14/2023)   Received from [facility]   Overall Financial Resource Strain (CARDIA)    Difficulty of Paying Living Expenses: Not hard at all  Food Insecurity: No Food Insecurity (08/26/2023)   Hunger Vital Sign    Worried About Running Out of Food in the Last Year: Never true    Ran Out of Food in the Last Year: Never true  Transportation Needs: No Transportation Needs (08/26/2023)   PRAPARE - Administrator, Civil Service (Medical): No    Lack of Transportation (Non-Medical): No  Physical Activity: Sufficiently Active (05/14/2023)   Received from [facility]   Exercise Vital Sign    Days of Exercise per Week: 5 days    Minutes of Exercise per Session: 30 min  Stress: No Stress Concern Present (05/14/2023)   Received from [facility] Occupational [facility] - Occupational Stress Questionnaire    Feeling of Stress : Not at all  Social Connections: Socially Integrated (05/14/2023)   Received from [facility]   Social Connection and Isolation Panel [NHANES]    Frequency of Communication with Friends and Family: Three times a week    Frequency of Social Gatherings with Friends and Family: Twice a week    Attends Religious Services: 1 to 4 times per year    Active Member of [employer] or Organizations: No    Attends Museum/gallery exhibitions officer: 1 to 4 times per year    Marital Status: Married  Catering manager Violence: Not At Risk (08/26/2023)   Humiliation, Afraid, Rape, and Kick questionnaire    Fear of Current or Ex-Partner: No    Emotionally Abused: No    Physically Abused: No    Sexually Abused: No    Family History:    Family History  Problem Relation Age of Onset   Heart attack Mother    Diabetes Mother    Emphysema Father    Atrial fibrillation Sister    Heart attack Brother    Heart attack Brother    Heart disease Brother    Breast cancer Sister      ROS:  Please see the history of present illness.   All other ROS reviewed and negative.     Physical Exam/Data:   Vitals:   08/28/23 0711 08/28/23 0730 08/28/23 0800 08/28/23 0818  BP: (!) 151/82 (!) 146/83 138/82 138/82  Pulse:  69 65 71  Resp:  (!) 22 19   Temp: (!) 97.5 F (36.4 C)     TempSrc: Oral     SpO2:  93% 95%   Weight:      Height:        Intake/Output Summary (Last 24 hours) at 08/28/2023 1338 Last data filed at 08/27/2023 2135 Gross per 24 hour  Intake 243 ml  Output --  Net 243 ml      08/26/2023    4:17 PM 08/26/2023    8:47 AM 08/20/2023    9:41 AM  Last 3 Weights  Weight (lbs) 207 lb 3.7 oz 205 lb 210 lb  Weight (kg) 94 kg 92.987 kg 95.255 kg     Body mass index is 29.73 kg/m.  General:  Well nourished, well developed, in no acute distress HEENT: normal Neck: no JVD Vascular: No carotid bruits; Distal pulses 2+ bilaterally Cardiac:  normal S1, S2; RRR; no  murmur  Lungs:  clear to auscultation bilaterally Abd: soft, nontender Ext: no edema Musculoskeletal:  No deformities, BUE and BLE strength normal and equal Skin: warm and dry  Neuro:  CNs 2-12 intact, no focal abnormalities noted Psych:  Normal affect   EKG:  The EKG was personally reviewed and demonstrates:  Sinus Rhythm, 74bpm 1st degree AVB Telemetry:  Telemetry was personally reviewed and demonstrates:  brief tracings of sinus  rhythm  Relevant CV Studies:  N/a   Laboratory Data:  High Sensitivity Troponin:   Recent Labs  Lab 08/28/23 1103 08/28/23 1237  TROPONINIHS 9 6     Chemistry Recent Labs  Lab 08/26/23 2024  CREATININE 1.27*  GFRNONAA 57*    No results for input(s): "PROT", "ALBUMIN", "AST", "ALT", "ALKPHOS", "BILITOT" in the last 168 hours. Lipids No results for input(s): "CHOL", "TRIG", "HDL", "LABVLDL", "LDLCALC", "CHOLHDL" in the last 168 hours.  Hematology Recent Labs  Lab 08/26/23 2024  WBC 12.1*  RBC 4.56  HGB 12.7*  HCT 38.9*  MCV 85.3  MCH 27.9  MCHC 32.6  RDW 12.7  PLT 201   Thyroid No results for input(s): "TSH", "FREET4" in the last 168 hours.  BNPNo results for input(s): "BNP", "PROBNP" in the last 168 hours.  DDimer No results for input(s): "DDIMER" in the last 168 hours.   Radiology/Studies:  DG Chest Port 1 View  Result Date: 08/28/2023 CLINICAL DATA:  Acute respiratory distress. EXAM: PORTABLE CHEST 1 VIEW COMPARISON:  August 27, 2023. FINDINGS: The heart size and mediastinal contours are within normal limits. Minimal bibasilar subsegmental atelectasis is noted. The visualized skeletal structures are unremarkable. IMPRESSION: Minimal bibasilar subsegmental atelectasis. Electronically Signed   By: Lupita Raider M.D.   On: 08/28/2023 08:07   DG Chest 2 View  Result Date: 08/27/2023 CLINICAL DATA:  Pneumothorax EXAM: CHEST - 2 VIEW COMPARISON:  08/26/2023 FINDINGS: Relatively low lung volumes with some increase in atelectasis or infiltrate at the left lung base. The pneumothorax seen previously at the left apex no longer conspicuous. Heart size and mediastinal contours are within normal limits. Aortic Atherosclerosis (ICD10-170.0). No effusion. Vertebral endplate spurring at multiple levels in the lower thoracic spine. Lumbar fixation hardware. IMPRESSION: 1. No pneumothorax. 2. Low lung volumes with left base atelectasis or infiltrate. Electronically Signed   By:  Corlis Leak M.D.   On: 08/27/2023 13:56   DG CHEST PORT 1 VIEW  Result Date: 08/26/2023 CLINICAL DATA:  Left-sided pneumothorax.  Follow-up exam. EXAM: PORTABLE CHEST 1 VIEW COMPARISON:  Chest radiograph dated 08/26/2023. FINDINGS: Interval resolution of the previously seen left-sided pneumothorax. No focal consolidation, or pleural effusion. Minimal bibasilar atelectasis. Stable cardiac silhouette. No acute osseous pathology. IMPRESSION: No detectable pneumothorax by radiograph. Electronically Signed   By: Elgie Collard M.D.   On: 08/26/2023 18:44   DG Lumbar Spine 2-3 Views  Result Date: 08/26/2023 CLINICAL DATA:  L1-2 XLIF EXAM: Intraoperative fluoroscopy COMPARISON:  Preop x-ray 07/31/2023 FINDINGS: Three fluoroscopic spot images submitted for review demonstrate placement of fixation plate and screws along the upper lumbar spine. Imaging was obtained to aid in treatment. Please correlate with real-time fluoroscopy of 1 minute 22 seconds. Cumulative dose 4.9 mGy IMPRESSION: Intraoperative fluoroscopy Electronically Signed   By: Karen Kays M.D.   On: 08/26/2023 16:14   DG Chest Port 1 View  Result Date: 08/26/2023 CLINICAL DATA:  Pneumothorax. EXAM: PORTABLE CHEST 1 VIEW COMPARISON:  May 03, 2023. FINDINGS: Stable cardiomediastinal silhouette. Small left apical pneumothorax is noted. Mild  bibasilar subsegmental atelectasis is noted. Bony thorax is unremarkable. IMPRESSION: Small left apical pneumothorax is noted. These results will be called to the ordering clinician or representative by the Radiologist Assistant, and communication documented in the PACS or zVision Dashboard. Electronically Signed   By: Lupita Raider M.D.   On: 08/26/2023 15:55   DG C-Arm 1-60 Min-No Report  Result Date: 08/26/2023 Fluoroscopy was utilized by the requesting physician.  No radiographic interpretation.   DG C-Arm 1-60 Min-No Report  Result Date: 08/26/2023 Fluoroscopy was utilized by the requesting  physician.  No radiographic interpretation.     Assessment and Plan:   SEWARD WINGROVE is a 77 y.o. male with a hx of CAD s/p STEMI with LAD/Lcx '04, HTN, HLD, tobacco use, COPD, subdural hematoma after fall, CVA who is being seen 08/28/2023 for the evaluation of chest pain at the request of Dr. Lovell Sheehan.  Chest pain -- developed left sided chest pain while turning over in bed this morning. Quite severe in nature, similar to the pain he had on evening of surgery. Different from pain he experienced with remote MI. Given SL NTG, dilaudid and robaxin with symptoms improving after a few hours.  -- EKG with no ST/T wave changes, hsTn negative x2 -- he has been able to ambulate with PT today without anginal chest pain -- suspect pain likely post op surgical pain  Back pain s/p anterolateral decompression L1-L2  -- with Dr. Danielle Dess 12/9, did have post op left apical pneumothorax that resolved on repeat films -- pain management per primary  CAD s/p remote STEMI with LAD/LCx stenting -- overall has done well over the years from a cardiac standpoint -- on ASA, statin, metoprolol, losartan PTA  HTN -- controlled -- continue metoprolol and losartan  For questions or updates, please contact  [facility] Please consult [email or web address] for contact info under    Signed, Laverda Page, NP  08/28/2023 1:38 PM

## 2023-08-28 NOTE — Progress Notes (Addendum)
Subjective: The patient is alert and pleasant.  His wife is at the bedside.  He complains of chest pain at his lower sternum.  This is much better presently.  He has a cardiac history and is followed by Dr. Wyline Mood.  To clarify I was not notified of his chest pain.  A message was sent inappropriately via epic chat that that I did not see it until 830 when I came to the hospital.  Objective: Vital signs in last 24 hours: Temp:  [97.3 F (36.3 C)-98.3 F (36.8 C)] 97.5 F (36.4 C) (12/11 0711) Pulse Rate:  [65-73] 71 (12/11 0818) Resp:  [17-22] 19 (12/11 0800) BP: (108-151)/(58-83) 138/82 (12/11 0818) SpO2:  [93 %-96 %] 95 % (12/11 0800) Estimated body mass index is 29.73 kg/m as calculated from the following:   Height as of this encounter: 5\' 10"  (1.778 m).   Weight as of this encounter: 94 kg.   Intake/Output from previous day: 12/10 0701 - 12/11 0700 In: 243 [P.O.:240; I.V.:3] Out: -  Intake/Output this shift: No intake/output data recorded.  Physical exam the patient is alert and pleasant.  His strength is normal.  He is in no apparent distress.  Lab Results: Recent Labs    08/26/23 2024  WBC 12.1*  HGB 12.7*  HCT 38.9*  PLT 201   BMET Recent Labs    08/26/23 2024  CREATININE 1.27*    Studies/Results: DG Chest Port 1 View  Result Date: 08/28/2023 CLINICAL DATA:  Acute respiratory distress. EXAM: PORTABLE CHEST 1 VIEW COMPARISON:  August 27, 2023. FINDINGS: The heart size and mediastinal contours are within normal limits. Minimal bibasilar subsegmental atelectasis is noted. The visualized skeletal structures are unremarkable. IMPRESSION: Minimal bibasilar subsegmental atelectasis. Electronically Signed   By: Lupita Raider M.D.   On: 08/28/2023 08:07   DG Chest 2 View  Result Date: 08/27/2023 CLINICAL DATA:  Pneumothorax EXAM: CHEST - 2 VIEW COMPARISON:  08/26/2023 FINDINGS: Relatively low lung volumes with some increase in atelectasis or infiltrate at the  left lung base. The pneumothorax seen previously at the left apex no longer conspicuous. Heart size and mediastinal contours are within normal limits. Aortic Atherosclerosis (ICD10-170.0). No effusion. Vertebral endplate spurring at multiple levels in the lower thoracic spine. Lumbar fixation hardware. IMPRESSION: 1. No pneumothorax. 2. Low lung volumes with left base atelectasis or infiltrate. Electronically Signed   By: Corlis Leak M.D.   On: 08/27/2023 13:56   DG CHEST PORT 1 VIEW  Result Date: 08/26/2023 CLINICAL DATA:  Left-sided pneumothorax.  Follow-up exam. EXAM: PORTABLE CHEST 1 VIEW COMPARISON:  Chest radiograph dated 08/26/2023. FINDINGS: Interval resolution of the previously seen left-sided pneumothorax. No focal consolidation, or pleural effusion. Minimal bibasilar atelectasis. Stable cardiac silhouette. No acute osseous pathology. IMPRESSION: No detectable pneumothorax by radiograph. Electronically Signed   By: Elgie Collard M.D.   On: 08/26/2023 18:44   DG Lumbar Spine 2-3 Views  Result Date: 08/26/2023 CLINICAL DATA:  L1-2 XLIF EXAM: Intraoperative fluoroscopy COMPARISON:  Preop x-ray 07/31/2023 FINDINGS: Three fluoroscopic spot images submitted for review demonstrate placement of fixation plate and screws along the upper lumbar spine. Imaging was obtained to aid in treatment. Please correlate with real-time fluoroscopy of 1 minute 22 seconds. Cumulative dose 4.9 mGy IMPRESSION: Intraoperative fluoroscopy Electronically Signed   By: Karen Kays M.D.   On: 08/26/2023 16:14   DG Chest Port 1 View  Result Date: 08/26/2023 CLINICAL DATA:  Pneumothorax. EXAM: PORTABLE CHEST 1 VIEW COMPARISON:  May 03, 2023. FINDINGS: Stable cardiomediastinal silhouette. Small left apical pneumothorax is noted. Mild bibasilar subsegmental atelectasis is noted. Bony thorax is unremarkable. IMPRESSION: Small left apical pneumothorax is noted. These results will be called to the ordering clinician or  representative by the Radiologist Assistant, and communication documented in the PACS or zVision Dashboard. Electronically Signed   By: Lupita Raider M.D.   On: 08/26/2023 15:55   DG C-Arm 1-60 Min-No Report  Result Date: 08/26/2023 Fluoroscopy was utilized by the requesting physician.  No radiographic interpretation.   DG C-Arm 1-60 Min-No Report  Result Date: 08/26/2023 Fluoroscopy was utilized by the requesting physician.  No radiographic interpretation.    Assessment/Plan: Postop day #2: The patient is doing well neurologically.  I think his chest pain is likely related to his surgery at L1-2.  He feels better now.  Nonetheless I will ask cardiology to see the patient.  LOS: 2 days     [name] [name] 08/28/2023, 9:13 AM

## 2023-08-28 NOTE — Progress Notes (Signed)
Physical Therapy Treatment Patient Details Name: [name] [name] MRN: [id number] DOB: [birth year] Today's Date: 08/28/2023   History of Present Illness Pt is a 77 y/o male presenting on 12/9 for anterolateral decompression using XLIF technique at L1-2 with lateral plate fixation. PMH includes: anemia, CAD, colitis, COPD, CAD, HTN, MI, paget disease of bone, URI, vertigo, prior back surgery, bil TKA.    PT Comments  Pt somewhat apprehensive of movement causing onset of chest pain due to events that happened this AM, however, he was agreeable to attempt. Pt actually mobilizing well and reporting appropriate soreness at surgical site. Pt ambulating 120 ft with a walker, SPO2 92% on 2L O2, HR 70's. Demonstrates improved gait speed and activity tolerance. Will continue to progress as tolerated.    If plan is discharge home, recommend the following: A little help with bathing/dressing/bathroom;Assistance with cooking/housework;Assist for transportation;Help with stairs or ramp for entrance   Can travel by private vehicle        Equipment Recommendations  Rolling walker (2 wheels)    Recommendations for Other Services       Precautions / Restrictions Precautions Precautions: Fall;Back Precaution Booklet Issued: Yes (comment) Precaution Comments: Verbally reviewed, provided written handout Required Braces or Orthoses: Spinal Brace Spinal Brace: Lumbar corset;Applied in sitting position Restrictions Weight Bearing Restrictions: No     Mobility  Bed Mobility Overal bed mobility: Needs Assistance Bed Mobility: Rolling, Sidelying to Sit Rolling: Min assist Sidelying to sit: Min assist       General bed mobility comments: Education provided on pillow splinting for comfort, minA to roll into left sidelying, pt able to bring BLE's off edge of bed, light assist to bring trunk to upright    Transfers Overall transfer level: Needs assistance Equipment used: Rolling walker (2  wheels) Transfers: Sit to/from Stand Sit to Stand: Contact guard assist                Ambulation/Gait Ambulation/Gait assistance: Contact guard assist Gait Distance (Feet): 120 Feet Assistive device: Rolling walker (2 wheels) Gait Pattern/deviations: Step-through pattern, Decreased stride length Gait velocity: decreased     General Gait Details: Verbal cues for walker use, activity pacing, CGA for safety. No gross unsteadiness noted   Stairs             Wheelchair Mobility     Tilt Bed    Modified Rankin (Stroke Patients Only)       Balance Overall balance assessment: Needs assistance Sitting-balance support: Feet supported Sitting balance-Leahy Scale: Good     Standing balance support: Bilateral upper extremity supported Standing balance-Leahy Scale: Poor Standing balance comment: reliant on RW                            Cognition Arousal: Alert Behavior During Therapy: WFL for tasks assessed/performed Overall Cognitive Status: Within Functional Limits for tasks assessed                                          Exercises      General Comments        Pertinent Vitals/Pain Pain Assessment Pain Assessment: Faces Faces Pain Scale: Hurts even more Pain Location: chest (known pneumothorax), L lateral low back Pain Descriptors / Indicators: Grimacing, Operative site guarding, Spasm Pain Intervention(s): Limited activity within patient's tolerance, Monitored during session, Premedicated before session  Home Living                          Prior Function            PT Goals (current goals can now be found in the care plan section) Acute Rehab PT Goals Patient Stated Goal: less pain PT Goal Formulation: With patient Time For Goal Achievement: 09/10/23 Potential to Achieve Goals: Good Progress towards PT goals: Progressing toward goals    Frequency    Min 5X/week      PT Plan       Co-evaluation              AM-PAC PT "6 Clicks" Mobility   Outcome Measure  Help needed turning from your back to your side while in a flat bed without using bedrails?: None Help needed moving from lying on your back to sitting on the side of a flat bed without using bedrails?: A Little Help needed moving to and from a bed to a chair (including a wheelchair)?: A Little Help needed standing up from a chair using your arms (e.g., wheelchair or bedside chair)?: A Little Help needed to walk in hospital room?: A Little Help needed climbing 3-5 steps with a railing? : A Little 6 Click Score: 19    End of Session Equipment Utilized During Treatment: Gait belt;Back brace;Oxygen Activity Tolerance: Patient tolerated treatment well Patient left: in chair;with call bell/phone within reach;with family/visitor present Nurse Communication: Mobility status PT Visit Diagnosis: Pain;Difficulty in walking, not elsewhere classified (R26.2) Pain - part of body:  (back)     Time: 0930-1002 PT Time Calculation (min) (ACUTE ONLY): 32 min  Charges:    $Therapeutic Activity: 23-37 mins PT General Charges $$ ACUTE PT VISIT: 1 Visit                     Lillia Pauls, PT, DPT Acute Rehabilitation Services Office [phone]    Norval Morton 08/28/2023, 11:56 AM

## 2023-08-28 NOTE — Care Management Important Message (Signed)
Important Message  Patient Details  Name: [name] [name] MRN: [id number] Date of Birth: 10-Aug-1946   Important Message Given:  Yes - Medicare IM     Sherilyn Banker 08/28/2023, 1:25 PM

## 2023-08-28 NOTE — Progress Notes (Signed)
    Durable Medical Equipment  (From admission, onward)           Start     Ordered   08/28/23 0800  For home use only DME Walker rolling  Once       Question Answer Comment  Walker: With 5 Inch Wheels   Patient needs a walker to treat with the following condition Gait instability      08/28/23 0800   08/28/23 0800  For home use only DME Other see comment  Once       Comments: Shower seat  Question:  Length of Need  Answer:  12 Months   08/28/23 0800

## 2023-08-29 DIAGNOSIS — M48062 Spinal stenosis, lumbar region with neurogenic claudication: Principal | ICD-10-CM

## 2023-08-29 DIAGNOSIS — R0789 Other chest pain: Secondary | ICD-10-CM | POA: Diagnosis not present

## 2023-08-29 MED ORDER — CARVEDILOL 12.5 MG PO TABS
12.5000 mg | ORAL_TABLET | Freq: Two times a day (BID) | ORAL | Status: DC
  Administered 2023-08-29 – 2023-08-30 (×2): 12.5 mg via ORAL
  Filled 2023-08-29 (×2): qty 1

## 2023-08-29 NOTE — Progress Notes (Signed)
Subjective: The patient is alert and pleasant.  He is in no apparent distress.  His wife is at the bedside.  He had another episode of chest pain this morning.  Dr. [place] has seen him.  Objective: Vital signs in last 24 hours: Temp:  [97.9 F (36.6 C)-98.2 F (36.8 C)] 97.9 F (36.6 C) (12/12 0735) Pulse Rate:  [59-71] 62 (12/12 0817) Resp:  [18-20] 18 (12/12 0326) BP: (132-179)/(62-88) 164/75 (12/12 0817) SpO2:  [94 %-95 %] 95 % (12/12 0735) Estimated body mass index is 29.73 kg/m as calculated from the following:   Height as of this encounter: 5\' 10"  (1.778 m).   Weight as of this encounter: 94 kg.   Intake/Output from previous day: 12/11 0701 - 12/12 0700 In: 240 [P.O.:240] Out: 500 [Urine:500] Intake/Output this shift: Total I/O In: 3 [I.V.:3] Out: -   Physical exam the patient is alert and pleasant.  His strength is normal.  He looks well.  Lab Results: Recent Labs    08/26/23 2024  WBC 12.1*  HGB 12.7*  HCT 38.9*  PLT 201   BMET Recent Labs    08/26/23 2024  CREATININE 1.27*    Studies/Results: DG Chest Port 1 View Result Date: 08/28/2023 CLINICAL DATA:  Acute respiratory distress. EXAM: PORTABLE CHEST 1 VIEW COMPARISON:  August 27, 2023. FINDINGS: The heart size and mediastinal contours are within normal limits. Minimal bibasilar subsegmental atelectasis is noted. The visualized skeletal structures are unremarkable. IMPRESSION: Minimal bibasilar subsegmental atelectasis. Electronically Signed   By: Lupita Raider M.D.   On: 08/28/2023 08:07    Assessment/Plan: Postop day #3: I think his chest pain is likely secondary to his diaphragmatic irritation/previous pneumothorax/send surgery.  He does not feel he is ready to go home yet.  Perhaps tomorrow.  LOS: 3 days     Cristi Loron 08/29/2023, 10:29 AM

## 2023-08-29 NOTE — Progress Notes (Signed)
Physical Therapy Treatment Patient Details Name: [name] [name] MRN: [id number] DOB: 1946-07-20 Today's Date: 08/29/2023   History of Present Illness Pt is a 77 y/o male presenting on 12/9 for anterolateral decompression using XLIF technique at L1-2 with lateral plate fixation. PMH includes: anemia, CAD, colitis, COPD, CAD, HTN, MI, paget disease of bone, URI, vertigo, prior back surgery, bil TKA.    PT Comments  Pt making excellent progress towards his physical therapy goals this session. Ambulating 250 ft with a walker and negotiated 5 steps with a railing without physical assist. SpO2 94% on RA and much improved chest pain. Did trial short distance with no AD, however, pt with increased instability so continue to recommend RW for external support currently. Education reviewed regarding spinal precautions, brace use, activity progression.    If plan is discharge home, recommend the following: A little help with bathing/dressing/bathroom;Assistance with cooking/housework;Assist for transportation;Help with stairs or ramp for entrance   Can travel by private vehicle        Equipment Recommendations  Rolling walker (2 wheels)    Recommendations for Other Services       Precautions / Restrictions Precautions Precautions: Fall;Back Precaution Booklet Issued: Yes (comment) Precaution Comments: Verbally reviewed, provided written handout Required Braces or Orthoses: Spinal Brace Spinal Brace: Lumbar corset;Applied in sitting position Restrictions Weight Bearing Restrictions Per Provider Order: No     Mobility  Bed Mobility               General bed mobility comments: OOB in recliner upon entrance. Provided verbal/visual instruction for log roll technique    Transfers Overall transfer level: Needs assistance Equipment used: Rolling walker (2 wheels) Transfers: Sit to/from Stand Sit to Stand: Supervision                Ambulation/Gait Ambulation/Gait assistance:  Supervision, Contact guard assist Gait Distance (Feet): 250 Feet Assistive device: Rolling walker (2 wheels), None Gait Pattern/deviations: Step-through pattern, Decreased stride length       General Gait Details: Trialed short distance with no AD, and pt with increased instability and decreased gait speed. Progressed to supervision with light assist of RW   Stairs Stairs: Yes Stairs assistance: Supervision Stair Management: One rail Right Number of Stairs: 5 General stair comments: cues for step by step pattern   Wheelchair Mobility     Tilt Bed    Modified Rankin (Stroke Patients Only)       Balance Overall balance assessment: Needs assistance Sitting-balance support: Feet supported Sitting balance-Leahy Scale: Good     Standing balance support: No upper extremity supported, During functional activity Standing balance-Leahy Scale: Fair                              Cognition Arousal: Alert Behavior During Therapy: WFL for tasks assessed/performed Overall Cognitive Status: Within Functional Limits for tasks assessed                                          Exercises      General Comments        Pertinent Vitals/Pain Pain Assessment Pain Assessment: Faces Faces Pain Scale: Hurts a little bit Pain Location: incisional Pain Descriptors / Indicators: Operative site guarding Pain Intervention(s): Monitored during session    Home Living  Prior Function            PT Goals (current goals can now be found in the care plan section) Acute Rehab PT Goals Patient Stated Goal: less pain Potential to Achieve Goals: Good Progress towards PT goals: Progressing toward goals    Frequency    Min 5X/week      PT Plan      Co-evaluation              AM-PAC PT "6 Clicks" Mobility   Outcome Measure  Help needed turning from your back to your side while in a flat bed without using  bedrails?: None Help needed moving from lying on your back to sitting on the side of a flat bed without using bedrails?: A Little Help needed moving to and from a bed to a chair (including a wheelchair)?: A Little Help needed standing up from a chair using your arms (e.g., wheelchair or bedside chair)?: A Little Help needed to walk in hospital room?: A Little Help needed climbing 3-5 steps with a railing? : A Little 6 Click Score: 19    End of Session Equipment Utilized During Treatment: Back brace Activity Tolerance: Patient tolerated treatment well Patient left: in chair;with call bell/phone within reach;with family/visitor present Nurse Communication: Mobility status PT Visit Diagnosis: Pain;Difficulty in walking, not elsewhere classified (R26.2)     Time: 4132-4401 PT Time Calculation (min) (ACUTE ONLY): 19 min  Charges:    $Gait Training: 8-22 mins PT General Charges $$ ACUTE PT VISIT: 1 Visit                     Lillia Pauls, PT, DPT Acute Rehabilitation Services Office [phone]    Norval Morton 08/29/2023, 12:29 PM

## 2023-08-29 NOTE — Progress Notes (Signed)
Rounding Note    Patient Name: [name] [name] Date of Encounter: 08/29/2023  [facility] Cardiologist: Dina Rich, MD   Subjective   States he had no pain while lying in bed but notes some chest pain now that he is up in chair. Pain worse with deep breath.   Inpatient Medications    Scheduled Meds:  dexamethasone  2 mg Oral Q12H   docusate sodium  100 mg Oral BID   enoxaparin (LOVENOX) injection  40 mg Subcutaneous Q24H   loratadine  10 mg Oral Daily   losartan  100 mg Oral Daily   metoprolol tartrate  12.5 mg Oral BID   pantoprazole  80 mg Oral Daily   senna  1 tablet Oral BID   simvastatin  40 mg Oral QHS   sodium chloride flush  3 mL Intravenous Q12H   Continuous Infusions:  PRN Meds: acetaminophen **OR** acetaminophen, albuterol, benzonatate, bisacodyl, HYDROmorphone (DILAUDID) injection, menthol-cetylpyridinium **OR** phenol, methocarbamol **OR** methocarbamol (ROBAXIN) injection, nitroGLYCERIN, ondansetron **OR** ondansetron (ZOFRAN) IV, oxyCODONE-acetaminophen, polyethylene glycol, sodium chloride flush, sodium phosphate   Vital Signs    Vitals:   08/29/23 0326 08/29/23 0735 08/29/23 0811 08/29/23 0817  BP: (!) 140/88 (!) 179/78 (!) 164/75 (!) 164/75  Pulse: (!) 59 (!) 59  62  Resp: 18     Temp: 98.2 F (36.8 C) 97.9 F (36.6 C)    TempSrc: Oral Oral    SpO2: 95% 95%    Weight:      Height:        Intake/Output Summary (Last 24 hours) at 08/29/2023 0858 Last data filed at 08/29/2023 0818 Gross per 24 hour  Intake 243 ml  Output 500 ml  Net -257 ml      08/26/2023    4:17 PM 08/26/2023    8:47 AM 08/20/2023    9:41 AM  Last 3 Weights  Weight (lbs) 207 lb 3.7 oz 205 lb 210 lb  Weight (kg) 94 kg 92.987 kg 95.255 kg      Telemetry    none - Personally Reviewed  ECG    Done yesterday. NSR, low voltage, otherwise normal.  - Personally Reviewed  Physical Exam   GEN: No acute distress.   Neck: No JVD Cardiac: RRR, no  murmurs, rubs, or gallops.  Respiratory: Clear to auscultation bilaterally. GI: Soft, nontender, non-distended  MS: No edema; No deformity. Neuro:  Nonfocal  Psych: Normal affect   Labs    High Sensitivity Troponin:   Recent Labs  Lab 08/28/23 1103 08/28/23 1237  TROPONINIHS 9 6     Chemistry Recent Labs  Lab 08/26/23 2024  CREATININE 1.27*  GFRNONAA 57*    Lipids No results for input(s): "CHOL", "TRIG", "HDL", "LABVLDL", "LDLCALC", "CHOLHDL" in the last 168 hours.  Hematology Recent Labs  Lab 08/26/23 2024  WBC 12.1*  RBC 4.56  HGB 12.7*  HCT 38.9*  MCV 85.3  MCH 27.9  MCHC 32.6  RDW 12.7  PLT 201   Thyroid No results for input(s): "TSH", "FREET4" in the last 168 hours.  BNPNo results for input(s): "BNP", "PROBNP" in the last 168 hours.  DDimer No results for input(s): "DDIMER" in the last 168 hours.   Radiology    DG Chest Port 1 View Result Date: 08/28/2023 CLINICAL DATA:  Acute respiratory distress. EXAM: PORTABLE CHEST 1 VIEW COMPARISON:  August 27, 2023. FINDINGS: The heart size and mediastinal contours are within normal limits. Minimal bibasilar subsegmental atelectasis is noted. The visualized skeletal  structures are unremarkable. IMPRESSION: Minimal bibasilar subsegmental atelectasis. Electronically Signed   By: Lupita Raider M.D.   On: 08/28/2023 08:07   DG Chest 2 View Result Date: 08/27/2023 CLINICAL DATA:  Pneumothorax EXAM: CHEST - 2 VIEW COMPARISON:  08/26/2023 FINDINGS: Relatively low lung volumes with some increase in atelectasis or infiltrate at the left lung base. The pneumothorax seen previously at the left apex no longer conspicuous. Heart size and mediastinal contours are within normal limits. Aortic Atherosclerosis (ICD10-170.0). No effusion. Vertebral endplate spurring at multiple levels in the lower thoracic spine. Lumbar fixation hardware. IMPRESSION: 1. No pneumothorax. 2. Low lung volumes with left base atelectasis or infiltrate.  Electronically Signed   By: Corlis Leak M.D.   On: 08/27/2023 13:56    Cardiac Studies   none  Patient Profile     77 y.o. male with a hx of CAD s/p STEMI with LAD/Lcx '04, HTN, HLD, tobacco use, COPD, subdural hematoma after fall, CVA who is being seen 08/28/2023 for the evaluation of chest pain at the request of Dr. Lovell Sheehan.   Assessment & Plan    Chest pain -- developed left sided chest pain while turning over in bed this morning. Quite severe in nature, similar to the pain he had on evening of surgery. Different from pain he experienced with remote MI. Given SL NTG, dilaudid and robaxin with symptoms improving after a few hours.  -- EKG with no ST/T wave changes, hsTn negative x2 -- still having some chest pain with deep breathing and moverment -- suspect pain likely post op surgical pain -- low suspicion for ACS   Back pain s/p anterolateral decompression L1-L2  -- with Dr. Danielle Dess 12/9, did have post op left apical pneumothorax that resolved on repeat films -- pain management per primary   CAD s/p remote STEMI with LAD/LCx stenting -- overall has done well over the years from a cardiac standpoint -- on ASA, statin, metoprolol, losartan PTA   HTN -- poorly controlled today -- continue  losartan 100 mg daily -- switch metoprolol to Coreg today -- suspect BP elevation partly related to pain.      For questions or updates, please contact  [facility] Please consult [email or web address] for contact info under        Signed, Shyhiem Beeney [place], MD  08/29/2023, 8:58 AM

## 2023-08-29 NOTE — Plan of Care (Signed)
  Problem: Education: Goal: Knowledge of General Education information will improve Description: Including pain rating scale, medication(s)/side effects and non-pharmacologic comfort measures Outcome: Progressing   Problem: Health Behavior/Discharge Planning: Goal: Ability to manage health-related needs will improve Outcome: Progressing   Problem: Activity: Goal: Risk for activity intolerance will decrease Outcome: Progressing   Problem: Pain Management: Goal: General experience of comfort will improve Outcome: Progressing   Problem: Skin Integrity: Goal: Risk for impaired skin integrity will decrease Outcome: Progressing

## 2023-08-29 NOTE — TOC Initial Note (Signed)
Transition of Care [facility]) - Initial/Assessment Note    Patient Details  Name: [name] [name] MRN: [id number] Date of Birth: 1946/07/25  [facility] of [facility] CM/SW Contact:    Epifanio Lesches, RN Phone Number: 08/29/2023, 10:05 AM  Clinical Narrative:                    - s/p decompression L1-L2 , 12/9 From home with wife. PTA independent with ADL's, no DME  usage.  Per PT/OT recommendations, pt without home health needs. DME needs noted , RW and shower seat. Pt agreeable to equipment. Referral made with [employer] and equipment will be delivered to bedside prior to d/c. Wife states will assist with care once d/c to home.  Pt without transportation issues and no medication concerns. Post hospital f/u noted on AVS.  TOC team following and will continue assisting with needs as they present...  Expected Discharge Plan: Home/Self Care Barriers to Discharge: Continued Medical Work up   Patient Goals and CMS Choice     Choice offered to / list presented to : Patient      Expected Discharge Plan and Services   Discharge Planning Services: CM Consult                     DME Arranged: Dan Humphreys rolling (Shower seat) DME Agency: [employer] Date DME Agency Contacted: 08/29/23 Time DME Agency Contacted: 1004 Representative spoke with at DME Agency: Doristine Locks            Prior Living Arrangements/Services   Lives with:: Spouse Patient language and need for interpreter reviewed:: Yes Do you feel safe going back to the place where you live?: Yes      Need for Family Participation in Patient Care: Yes (Comment) Care giver support system in place?: Yes (comment)   Criminal Activity/Legal Involvement Pertinent to Current Situation/Hospitalization: No - Comment as needed  Activities of Daily Living   ADL Screening (condition at time of admission) Independently performs ADLs?: Yes (appropriate for developmental age) Is the patient deaf or have difficulty hearing?:  No Does the patient have difficulty seeing, even when wearing glasses/contacts?: No Does the patient have difficulty concentrating, remembering, or making decisions?: No  Permission Sought/Granted                  Emotional Assessment Appearance:: Appears stated age Attitude/Demeanor/Rapport: Engaged Affect (typically observed): Accepting Orientation: : Oriented to Self, Oriented to Place, Oriented to  Time, Oriented to Situation Alcohol / Substance Use: Illicit Drugs Psych Involvement: No (comment)  Admission diagnosis:  Spinal stenosis of lumbar region with neurogenic claudication [M48.062] Patient Active Problem List   Diagnosis Date Noted   Spinal stenosis of lumbar region with neurogenic claudication 08/26/2023   Lumbar spinal stenosis 01/03/2015   Arthritis of right knee 11/16/2013   Hyperlipidemia with target low density lipoprotein (LDL) cholesterol less than 100 mg/dL 69/62/9528   Essential hypertension 08/06/2012   Atypical chest pain 08/29/2011   CAD, NATIVE VESSEL 07/10/2010   GERD 05/09/2009   ARTHRITIS 05/09/2009   PCP:  Lindaann Slough, DO Pharmacy:   [facility] [place] - [address] [place] [address] Phone: [phone] Fax: [phone]     Social Drivers of Health (SDOH) Social History: SDOH Screenings   Food Insecurity: No Food Insecurity (08/26/2023)  Housing: Low Risk  (08/26/2023)  Transportation Needs: No Transportation Needs (08/26/2023)  Utilities: Not At Risk (08/26/2023)  Financial Resource  Strain: Low Risk  (05/14/2023)   [facility] from [facility]  Physical Activity: Sufficiently Active (05/14/2023)   Received from [facility]  Social Connections: Socially Integrated (05/14/2023)   Received from [facility]  Stress: No Stress Concern Present (05/14/2023)   Received from [facility]  Tobacco Use: Medium Risk (08/23/2023)  Health Literacy: Low Risk  (09/14/2022)   [facility]   SDOH Interventions:     Readmission Risk Interventions     No data to display

## 2023-08-29 NOTE — Plan of Care (Signed)

## 2023-08-30 MED ORDER — DOCUSATE SODIUM 100 MG PO CAPS: 100.0000 mg | ORAL_CAPSULE | Freq: Two times a day (BID) | ORAL | 0 refills | Status: DC

## 2023-08-30 MED ORDER — OXYCODONE-ACETAMINOPHEN 5-325 MG PO TABS: 1.0000 | ORAL_TABLET | Freq: Four times a day (QID) | ORAL | 0 refills | Status: DC | PRN

## 2023-08-30 NOTE — Progress Notes (Signed)
Physical Therapy Treatment Patient Details Name: [name] [name] MRN: [id number] DOB: 06-03-1946 Today's Date: 08/30/2023   History of Present Illness Pt is a 77 y/o male presenting on 12/9 for anterolateral decompression using XLIF technique at L1-2 with lateral plate fixation. PMH includes: anemia, CAD, colitis, COPD, CAD, HTN, MI, paget disease of bone, URI, vertigo, prior back surgery, bil TKA.    PT Comments  Pt reporting good pain control and is mobilizing well. Ambulating 400 ft with a walker at a supervision level. Recalls 3/3 spinal precautions and reports no further questions/concerns.    If plan is discharge home, recommend the following: A little help with bathing/dressing/bathroom;Assistance with cooking/housework;Assist for transportation;Help with stairs or ramp for entrance   Can travel by private vehicle        Equipment Recommendations  Rolling walker (2 wheels)    Recommendations for Other Services       Precautions / Restrictions Precautions Precautions: Fall;Back Precaution Booklet Issued: Yes (comment) Precaution Comments: Verbally reviewed, provided written handout Required Braces or Orthoses: Spinal Brace Spinal Brace: Lumbar corset;Applied in sitting position Restrictions Weight Bearing Restrictions Per Provider Order: No     Mobility  Bed Mobility               General bed mobility comments: OOB in recliner upon entrance. Provided verbal/visual instruction for log roll technique    Transfers Overall transfer level: Modified independent Equipment used: Rolling walker (2 wheels)                    Ambulation/Gait Ambulation/Gait assistance: Supervision Gait Distance (Feet): 400 Feet Assistive device: Rolling walker (2 wheels), None Gait Pattern/deviations: Step-through pattern, Decreased stride length       General Gait Details: Good posture and steady pace. No overt LOB   Stairs             Wheelchair Mobility      Tilt Bed    Modified Rankin (Stroke Patients Only)       Balance Overall balance assessment: Needs assistance Sitting-balance support: Feet supported Sitting balance-Leahy Scale: Good     Standing balance support: No upper extremity supported, During functional activity Standing balance-Leahy Scale: Fair                              Cognition Arousal: Alert Behavior During Therapy: WFL for tasks assessed/performed Overall Cognitive Status: Within Functional Limits for tasks assessed                                          Exercises      General Comments        Pertinent Vitals/Pain Pain Assessment Pain Assessment: Faces Faces Pain Scale: Hurts a little bit Pain Location: incisional Pain Descriptors / Indicators: Operative site guarding Pain Intervention(s): Monitored during session    Home Living                          Prior Function            PT Goals (current goals can now be found in the care plan section) Acute Rehab PT Goals Patient Stated Goal: less pain Potential to Achieve Goals: Good Progress towards PT goals: Progressing toward goals    Frequency    Min 5X/week  PT Plan      Co-evaluation              AM-PAC PT "6 Clicks" Mobility   Outcome Measure  Help needed turning from your back to your side while in a flat bed without using bedrails?: None Help needed moving from lying on your back to sitting on the side of a flat bed without using bedrails?: A Little Help needed moving to and from a bed to a chair (including a wheelchair)?: None Help needed standing up from a chair using your arms (e.g., wheelchair or bedside chair)?: A Little Help needed to walk in hospital room?: A Little Help needed climbing 3-5 steps with a railing? : A Little 6 Click Score: 20    End of Session Equipment Utilized During Treatment: Back brace Activity Tolerance: Patient tolerated treatment  well Patient left: in chair;with call bell/phone within reach;with family/visitor present Nurse Communication: Mobility status PT Visit Diagnosis: Pain;Difficulty in walking, not elsewhere classified (R26.2)     Time: 4034-7425 PT Time Calculation (min) (ACUTE ONLY): 18 min  Charges:    $Therapeutic Activity: 8-22 mins PT General Charges $$ ACUTE PT VISIT: 1 Visit                     Lillia Pauls, PT, DPT Acute Rehabilitation Services Office [phone]    Norval Morton 08/30/2023, 11:32 AM

## 2023-08-30 NOTE — Plan of Care (Signed)
  Problem: Activity: Goal: Risk for activity intolerance will decrease Outcome: Progressing   Problem: Pain Management: Goal: General experience of comfort will improve Outcome: Progressing

## 2023-08-30 NOTE — Discharge Summary (Signed)
Physician Discharge Summary  Patient ID: [name] [name] MRN: [id number] DOB/AGE: [birth year] 77 y.o.  Admit date: 08/26/2023 Discharge date: 08/30/2023  Admission Diagnoses: Lumbar spinal stenosis, atypical chest pain  Discharge Diagnoses: The same Principal Problem:   Spinal stenosis of lumbar region with neurogenic claudication Active Problems:   Atypical chest pain   Discharged Condition: good  Hospital Course: Dr. Danielle Dess performed a L1-2 to lateral interbody fusion instrumentation on the patient on 08/26/2023.  The patient's postoperative course was remarkable only for some chest pain.  Cardiology saw the patient.  His EKG and enzymes turned out okay.  His pain was attributed to his surgery.  On 08/30/2023 the patient felt better and requested discharge home.  The patient, and his wife were given verbal and written discharge instructions.  All their questions were answered.  Consults: PT, cardiology Significant Diagnostic Studies: EKG Treatments: L1-2 lateral interbody fusion and instrumentation Discharge Exam: Blood pressure (!) 151/73, pulse (!) 57, temperature 98.3 F (36.8 C), temperature source Oral, resp. rate 16, height 5\' 10"  (1.778 m), weight 94 kg, SpO2 95%. The patient is alert and pleasant.  His dressing is clean dry.  Strength is normal.  He looks well.  Disposition: Home  Discharge Instructions     Call MD for:  difficulty breathing, headache or visual disturbances   Complete by: As directed    Call MD for:  extreme fatigue   Complete by: As directed    Call MD for:  hives   Complete by: As directed    Call MD for:  persistant dizziness or light-headedness   Complete by: As directed    Call MD for:  persistant nausea and vomiting   Complete by: As directed    Call MD for:  redness, tenderness, or signs of infection (pain, swelling, redness, odor or green/yellow discharge around incision site)   Complete by: As directed    Call MD for:  severe  uncontrolled pain   Complete by: As directed    Call MD for:  temperature >100.4   Complete by: As directed    Diet - low sodium heart healthy   Complete by: As directed    Discharge instructions   Complete by: As directed    Call [phone] for a followup appointment. Take a stool softener while you are using pain medications.   Driving Restrictions   Complete by: As directed    Do not drive for 2 weeks.   Incentive spirometry RT   Complete by: As directed    Increase activity slowly   Complete by: As directed    Lifting restrictions   Complete by: As directed    Do not lift more than 5 pounds. No excessive bending or twisting.   May shower / Bathe   Complete by: As directed    Remove the dressing for 3 days after surgery.  You may shower, but leave the incision alone.   No dressing needed   Complete by: As directed       Allergies as of 08/30/2023       Reactions   Codeine Nausea And Vomiting        Medication List     STOP taking these medications    acetaminophen 325 MG tablet Commonly known as: TYLENOL   HYDROcodone-acetaminophen 5-325 MG tablet Commonly known as: NORCO/VICODIN       TAKE these medications    albuterol 108 (90 Base) MCG/ACT inhaler Commonly known as: VENTOLIN HFA Inhale 2 puffs into  the lungs every 6 (six) hours as needed for wheezing or shortness of breath.   albuterol (2.5 MG/3ML) 0.083% nebulizer solution Commonly known as: PROVENTIL Take 2.5 mg by nebulization every 6 (six) hours as needed for wheezing or shortness of breath.   aspirin EC 325 MG tablet Take 325 mg by mouth daily with lunch.   benzonatate 100 MG capsule Commonly known as: TESSALON Take 1 capsule (100 mg total) by mouth every 8 (eight) hours. What changed:  when to take this reasons to take this   cetirizine 10 MG tablet Commonly known as: ZYRTEC Take 10 mg by mouth daily.   docusate sodium 100 MG capsule Commonly known as: COLACE Take 1 capsule (100  mg total) by mouth 2 (two) times daily.   losartan 100 MG tablet Commonly known as: COZAAR TAKE 1 TABLET BY MOUTH DAILY   methocarbamol 500 MG tablet Commonly known as: ROBAXIN Take 500 mg by mouth 4 (four) times daily as needed.   metoprolol tartrate 25 MG tablet Commonly known as: LOPRESSOR Take 0.5 tablets (12.5 mg total) by mouth 2 (two) times daily.   multivitamin capsule Take 1 capsule by mouth daily in the afternoon.   nitroGLYCERIN 0.4 MG SL tablet Commonly known as: NITROSTAT Place 1 tablet (0.4 mg total) under the tongue every 5 (five) minutes as needed. Chest pain   omeprazole 40 MG capsule Commonly known as: PRILOSEC Take 40 mg by mouth daily.   oxyCODONE-acetaminophen 5-325 MG tablet Commonly known as: PERCOCET/ROXICET Take 1-2 tablets by mouth every 6 (six) hours as needed for moderate pain (pain score 4-6) or severe pain (pain score 7-10).   simvastatin 40 MG tablet Commonly known as: ZOCOR Take 1 tablet (40 mg total) by mouth at bedtime.               Durable Medical Equipment  (From admission, onward)           Start     Ordered   08/28/23 0804  For home use only DME Walker rolling  Once       Question Answer Comment  Walker: With 5 Inch Wheels   Patient needs a walker to treat with the following condition Gait instability      08/28/23 0803   08/28/23 0803  For home use only DME Other see comment  Once       Comments: Shower seat  Question:  Length of Need  Answer:  12 Months   08/28/23 0802              Discharge Care Instructions  (From admission, onward)           Start     Ordered   08/30/23 0000  No dressing needed        08/30/23 0810            Follow-up Information     Jules Husbands, Deaundre A, DO Follow up.   Specialty: Family Medicine Contact information: [address] [place] [address] [phone]         [facility] Follow up.   Specialty: Neurosurgery Contact  information: [address] [place] [address] [phone]                 Signed: Cristi Loron 08/30/2023, 8:11 AM

## 2023-09-02 ENCOUNTER — Encounter (HOSPITAL_COMMUNITY): Payer: Self-pay | Admitting: Neurological Surgery

## 2023-09-05 DIAGNOSIS — Z981 Arthrodesis status: Secondary | ICD-10-CM | POA: Diagnosis not present

## 2023-09-05 DIAGNOSIS — Z09 Encounter for follow-up examination after completed treatment for conditions other than malignant neoplasm: Secondary | ICD-10-CM | POA: Diagnosis not present

## 2023-09-05 DIAGNOSIS — I10 Essential (primary) hypertension: Secondary | ICD-10-CM | POA: Diagnosis not present

## 2023-09-05 DIAGNOSIS — I2510 Atherosclerotic heart disease of native coronary artery without angina pectoris: Secondary | ICD-10-CM | POA: Diagnosis not present

## 2023-09-05 DIAGNOSIS — E119 Type 2 diabetes mellitus without complications: Secondary | ICD-10-CM | POA: Diagnosis not present

## 2023-09-05 DIAGNOSIS — J432 Centrilobular emphysema: Secondary | ICD-10-CM | POA: Diagnosis not present

## 2023-09-05 DIAGNOSIS — E785 Hyperlipidemia, unspecified: Secondary | ICD-10-CM | POA: Diagnosis not present

## 2023-09-16 DIAGNOSIS — J441 Chronic obstructive pulmonary disease with (acute) exacerbation: Secondary | ICD-10-CM | POA: Diagnosis not present

## 2023-09-16 DIAGNOSIS — R300 Dysuria: Secondary | ICD-10-CM | POA: Diagnosis not present

## 2023-09-23 ENCOUNTER — Ambulatory Visit
Admission: EM | Admit: 2023-09-23 | Discharge: 2023-09-23 | Disposition: A | Discharge status: Home / Self Care | Payer: Medicare Other | Attending: Family Medicine | Admitting: Family Medicine

## 2023-09-23 DIAGNOSIS — H524 Presbyopia: Secondary | ICD-10-CM | POA: Diagnosis not present

## 2023-09-23 DIAGNOSIS — H04123 Dry eye syndrome of bilateral lacrimal glands: Secondary | ICD-10-CM | POA: Diagnosis not present

## 2023-09-23 DIAGNOSIS — B029 Zoster without complications: Secondary | ICD-10-CM | POA: Diagnosis not present

## 2023-09-23 DIAGNOSIS — Z961 Presence of intraocular lens: Secondary | ICD-10-CM | POA: Diagnosis not present

## 2023-09-23 DIAGNOSIS — H35363 Drusen (degenerative) of macula, bilateral: Secondary | ICD-10-CM | POA: Diagnosis not present

## 2023-09-23 DIAGNOSIS — D3131 Benign neoplasm of right choroid: Secondary | ICD-10-CM | POA: Diagnosis not present

## 2023-09-23 MED ORDER — PREDNISONE 20 MG PO TABS
40.0000 mg | ORAL_TABLET | Freq: Every day | ORAL | 0 refills | Status: DC
Start: 2023-09-23 — End: 2023-11-11

## 2023-09-23 MED ORDER — VALACYCLOVIR HCL 1 G PO TABS: 1000.0000 mg | ORAL_TABLET | Freq: Three times a day (TID) | ORAL | 0 refills | Status: AC

## 2023-09-23 NOTE — ED Triage Notes (Signed)
 Pt c/o pain on left side of his head to neck that started 3 days ago and has got worse. Pt states it feels like a stabbing pain that comes and goes.  Pt states he had back surgery 4 weeks ago.

## 2023-09-23 NOTE — ED Provider Notes (Signed)
 [facility]    CSN: [id number] Arrival date & time: 09/23/23  1633      History   Chief Complaint Chief Complaint  Patient presents with   Neck Pain    HPI [name] [name] is a 78 y.o. male.   Patient presenting today with 3-day history of left-sided neck soreness and tenderness, states he is getting stabbing pains that come and go additionally to the area.  Denies injury to the area, worsening pain with movement, radiation of pain down the arm, numbness, tingling to the arm.  So far trying heat, ice with no relief.  Of note, he had back surgery about 4 weeks ago which he is recovering fairly well from and has follow-up with neurosurgery Wednesday.    Past Medical History:  Diagnosis Date   Anemia    Arthritis    Asthma    with exertion   Back pain    CAD (coronary artery disease)    a. s/p STEMI in 2004 with stenting to LAD and LCx b. low-risk NST in 2015   Cataracts, bilateral    immature   Colitis    just finished one antibiotic and Cipro will be finished up   COPD (chronic obstructive pulmonary disease) (HCC)    no meds required   Coronary artery disease    Diverticulosis    GERD (gastroesophageal reflux disease)    takes Omeprazole  daily   Hemorrhoids    History of bronchitis    many yrs ago   History of colon polyps    Hyperlipidemia    takes Simvastatin  nightly   Hypertension    takes Losartan  and Metoprolol  daily   Joint pain    Myocardial infarct (HCC) 09/17/2002   Paget disease of bone    takes Fosamax daily   Shortness of breath    URI (upper respiratory infection)    Dec 31,2014   Vertigo    takes Meclizine  daily as needed    Patient Active Problem List   Diagnosis Date Noted   Spinal stenosis of lumbar region with neurogenic claudication 08/26/2023   Lumbar spinal stenosis 01/03/2015   Arthritis of right knee 11/16/2013   Hyperlipidemia with target low density lipoprotein (LDL) cholesterol less than 100 mg/dL 88/79/7986    Essential hypertension 08/06/2012   Atypical chest pain 08/29/2011   CAD, NATIVE VESSEL 07/10/2010   GERD 05/09/2009   ARTHRITIS 05/09/2009    Past Surgical History:  Procedure Laterality Date   ABDOMINAL EXPLORATION SURGERY     ANTERIOR LAT LUMBAR FUSION N/A 08/26/2023   Procedure: Extreme Lateral INTERBODY FUSION LUMBAR ONE-TWO;  Surgeon: Colon Shove, MD;  Location: [facility] OR;  Service: Neurosurgery;  Laterality: N/A;  C3   APPENDECTOMY     BACK SURGERY     x 2   bilateral knee arthroscopies     x 3   CARPAL TUNNEL RELEASE Right    COLONOSCOPY     COLONOSCOPY WITH PROPOFOL  N/A 01/23/2022   Procedure: COLONOSCOPY WITH PROPOFOL ;  Surgeon: Eartha Angelia Sieving, MD;  Location: [facility];  Service: Gastroenterology;  Laterality: N/A;  10:10 ASA 2   CORONARY ANGIOPLASTY     3 stents   DENTAL SURGERY     ESOPHAGOGASTRODUODENOSCOPY     HERNIA REPAIR     inguinal   JOINT REPLACEMENT Left    LEFT HEART CATHETERIZATION WITH CORONARY ANGIOGRAM N/A 08/27/2011   Procedure: LEFT HEART CATHETERIZATION WITH CORONARY ANGIOGRAM;  Surgeon: Debby JONETTA Como, MD;  Location:  [facility];  Service: Cardiovascular;  Laterality: N/A;   LUMBAR LAMINECTOMY WITH COFLEX 2 LEVEL N/A 01/03/2015   Procedure: Lumbar three-four,Lumbar four-five Laminectomy with Coflex;  Surgeon: Victory Gens, MD;  Location: [facility];  Service: Neurosurgery;  Laterality: N/A;   POLYPECTOMY  01/23/2022   Procedure: POLYPECTOMY;  Surgeon: Eartha Angelia Sieving, MD;  Location: [facility];  Service: Gastroenterology;;   TOTAL KNEE ARTHROPLASTY Right 11/16/2013   DR LIAM   TOTAL KNEE ARTHROPLASTY Right 11/16/2013   Procedure: TOTAL KNEE ARTHROPLASTY;  Surgeon: Dempsey JINNY Liam, MD;  Location: [facility] OR;  Service: Orthopedics;  Laterality: Right;   TOTAL KNEE ARTHROPLASTY Left 01/12/02       Home Medications    Prior to Admission medications   Medication Sig Start Date End Date Taking? Authorizing Provider  albuterol   (PROVENTIL  HFA;VENTOLIN  HFA) 108 (90 Base) MCG/ACT inhaler Inhale 2 puffs into the lungs every 6 (six) hours as needed for wheezing or shortness of breath.   Yes [provider]  albuterol  (PROVENTIL ) (2.5 MG/3ML) 0.083% nebulizer solution Take 2.5 mg by nebulization every 6 (six) hours as needed for wheezing or shortness of breath.   Yes [provider]  aspirin  EC 325 MG tablet Take 325 mg by mouth daily with lunch.   Yes [provider]  cetirizine (ZYRTEC) 10 MG tablet Take 10 mg by mouth daily.   Yes [provider]  losartan  (COZAAR ) 100 MG tablet TAKE 1 TABLET BY MOUTH DAILY 08/07/23  Yes Branch, Dorn FALCON, MD  metoprolol  tartrate (LOPRESSOR ) 25 MG tablet Take 0.5 tablets (12.5 mg total) by mouth 2 (two) times daily. 04/08/23  Yes BranchDorn FALCON, MD  omeprazole  (PRILOSEC) 40 MG capsule Take 40 mg by mouth daily.   Yes [provider]  oxyCODONE -acetaminophen  (PERCOCET/ROXICET) 5-325 MG tablet Take 1-2 tablets by mouth every 6 (six) hours as needed for moderate pain (pain score 4-6) or severe pain (pain score 7-10). 08/30/23  Yes Mavis Purchase, MD  predniSONE  (DELTASONE ) 20 MG tablet Take 2 tablets (40 mg total) by mouth daily with breakfast. 09/23/23  Yes Stuart Vernell Norris, PA-C  simvastatin  (ZOCOR ) 40 MG tablet Take 1 tablet (40 mg total) by mouth at bedtime. 04/08/23  Yes Branch, Dorn FALCON, MD  valACYclovir  (VALTREX ) 1000 MG tablet Take 1 tablet (1,000 mg total) by mouth 3 (three) times daily for 7 days. 09/23/23 09/30/23 Yes Stuart Vernell Norris, PA-C  benzonatate  (TESSALON ) 100 MG capsule Take 1 capsule (100 mg total) by mouth every 8 (eight) hours. Patient taking differently: Take 100 mg by mouth 3 (three) times daily as needed for cough. 07/13/23   Enedelia Dorna HERO, FNP  docusate sodium  (COLACE) 100 MG capsule Take 1 capsule (100 mg total) by mouth 2 (two) times daily. 08/30/23   Mavis Purchase, MD  methocarbamol  (ROBAXIN ) 500 MG  tablet Take 500 mg by mouth 4 (four) times daily as needed. 07/31/23   [provider]  Multiple Vitamin (MULTIVITAMIN) capsule Take 1 capsule by mouth daily in the afternoon. Patient not taking: Reported on 08/20/2023    [provider]  nitroGLYCERIN  (NITROSTAT ) 0.4 MG SL tablet Place 1 tablet (0.4 mg total) under the tongue every 5 (five) minutes as needed. Chest pain 07/23/19   Alvan Dorn FALCON, MD    Family History Family History  Problem Relation Age of Onset   Heart attack Mother    Diabetes Mother    Emphysema Father    Atrial fibrillation Sister  Heart attack Brother    Heart attack Brother    Heart disease Brother    Breast cancer Sister     Social History Social History   Tobacco Use   Smoking status: Former    Current packs/day: 0.00    Types: Cigarettes    Start date: 01/01/1958    Quit date: 09/17/1998    Years since quitting: 25.0   Smokeless tobacco: Never   Tobacco comments:    quit smoking in 2001  Vaping Use   Vaping status: Never Used  Substance Use Topics   Alcohol use: Not Currently    Comment: occasionally wine   Drug use: No     Allergies   Codeine   Review of Systems Review of Systems Per HPI  Physical Exam Triage Vital Signs ED Triage Vitals  Encounter Vitals Group     BP 09/23/23 1647 (!) 147/78     Systolic BP Percentile --      Diastolic BP Percentile --      Pulse Rate 09/23/23 1647 79     Resp 09/23/23 1647 20     Temp 09/23/23 1647 97.9 F (36.6 C)     Temp Source 09/23/23 1647 Oral     SpO2 09/23/23 1647 94 %     Weight --      Height --      Head Circumference --      Peak Flow --      Pain Score 09/23/23 1648 10     Pain Loc --      Pain Education --      Exclude from Growth Chart --    No data found.  Updated Vital Signs BP (!) 147/78 (BP Location: Right Arm)   Pulse 79   Temp 97.9 F (36.6 C) (Oral)   Resp 20   SpO2 94%   Visual Acuity Right Eye Distance:   Left Eye Distance:    Bilateral Distance:    Right Eye Near:   Left Eye Near:    Bilateral Near:     Physical Exam Vitals and nursing note reviewed.  Constitutional:      Appearance: Normal appearance.  HENT:     Head: Atraumatic.  Eyes:     Extraocular Movements: Extraocular movements intact.     Conjunctiva/sclera: Conjunctivae normal.  Cardiovascular:     Rate and Rhythm: Normal rate and regular rhythm.  Pulmonary:     Effort: Pulmonary effort is normal.     Breath sounds: Normal breath sounds.  Musculoskeletal:        General: Normal range of motion.     Cervical back: Normal range of motion and neck supple.     Comments: No midline spinal tenderness to palpation diffusely.  Normal range of motion of the cervical spine with no exacerbation of pain on motion.  No muscular spasms to the left SCM, trapezius and no joint tenderness palpable at the left upper extremity.  Skin:    General: Skin is warm and dry.     Findings: Rash present.     Comments: Erythematous vesicular rash present to left side of neck in a linear pattern  Neurological:     General: No focal deficit present.     Mental Status: He is oriented to person, place, and time.     Comments: Left upper extremity neurovascularly intact  Psychiatric:        Mood and Affect: Mood normal.        Thought  Content: Thought content normal.        Judgment: Judgment normal.      UC Treatments / Results  Labs (all labs ordered are listed, but only abnormal results are displayed) Labs Reviewed - No data to display  EKG   Radiology No results found.  Procedures Procedures (including critical care time)  Medications Ordered in UC Medications - No data to display  Initial Impression / Assessment and Plan / UC Course  I have reviewed the triage vital signs and the nursing notes.  Pertinent labs & imaging results that were available during my care of the patient were reviewed by me and considered in my medical decision making (see  chart for details).     Consistent with shingles.  Per wife who bathe him this morning the rash was not present even this morning.  Treat with Valtrex , prednisone , over-the-counter pain relievers.  Already taking gabapentin status post back surgery.  Follow-up with PCP for recheck.  Final Clinical Impressions(s) / UC Diagnoses   Final diagnoses:  Herpes zoster without complication   Discharge Instructions   None    ED Prescriptions     Medication Sig Dispense Auth. Provider   valACYclovir  (VALTREX ) 1000 MG tablet Take 1 tablet (1,000 mg total) by mouth 3 (three) times daily for 7 days. 21 tablet Stuart Vernell Norris, PA-C   predniSONE  (DELTASONE ) 20 MG tablet Take 2 tablets (40 mg total) by mouth daily with breakfast. 10 tablet Stuart Vernell Norris, [place]      PDMP not reviewed this encounter.   Stuart Vernell Norris, [place] 09/23/23 1951

## 2023-09-25 DIAGNOSIS — M48062 Spinal stenosis, lumbar region with neurogenic claudication: Secondary | ICD-10-CM | POA: Diagnosis not present

## 2023-10-26 ENCOUNTER — Other Ambulatory Visit: Payer: Self-pay | Admitting: Cardiology

## 2023-11-06 ENCOUNTER — Other Ambulatory Visit (HOSPITAL_COMMUNITY): Payer: Self-pay | Admitting: Neurological Surgery

## 2023-11-06 DIAGNOSIS — M48062 Spinal stenosis, lumbar region with neurogenic claudication: Secondary | ICD-10-CM

## 2023-11-11 ENCOUNTER — Ambulatory Visit: Payer: Medicare Other | Attending: Cardiology | Admitting: Cardiology

## 2023-11-11 ENCOUNTER — Encounter: Payer: Self-pay | Admitting: Cardiology

## 2023-11-11 VITALS — BP 110/72 | HR 82 | Ht 70.0 in | Wt 214.4 lb

## 2023-11-11 DIAGNOSIS — I10 Essential (primary) hypertension: Secondary | ICD-10-CM | POA: Diagnosis not present

## 2023-11-11 DIAGNOSIS — E782 Mixed hyperlipidemia: Secondary | ICD-10-CM | POA: Diagnosis not present

## 2023-11-11 DIAGNOSIS — I2510 Atherosclerotic heart disease of native coronary artery without angina pectoris: Secondary | ICD-10-CM

## 2023-11-11 MED ORDER — EZETIMIBE 10 MG PO TABS: 10.0000 mg | ORAL_TABLET | Freq: Every day | ORAL | 3 refills | Status: AC

## 2023-11-11 NOTE — Progress Notes (Signed)
 Clinical Summary [name] [name] is a 78 y.o.male seen today for follow up of the following medical problems.        1. CAD   - prior STEMI in 2004, received stent to LAD and LCX   - 08/2011 Echo LVEF 50-55%   - Jan 2015 Lexiscan MPI no ischemia    [facility] 2022 echo: LVEF 65-70%, grade I dd -12/2021 visit with PA [place] Ouch reported some recent DOE - 01/2022 nuclear stress: inferior infarct, no current ischemia.  - no recent chest pains. Ongoing SOB/DOE, cough and wheeze at times.      2. Hyperlipidemia - followed by pcp  -Reports tried on more potent statins but caused muscle aches. Tolerating simva 40mg  daily well.      08/2019 TC 139 TG 130 HDL 38 LDL 78 -10/2020 TC 123 TG 108 HDL 35 LDL 66 - compliant with meds - labs followed by pcp, upcoming labs  08/2023 TC 138 TG 124 HDL 39 LDL 74   3. HTN - compliant with meds - home bp's elevated but was on prednisone at the time for recent      4. COPD/Tobacco history - abnormal PFTs 06/2017, managed by pcp - 07/2018 no AAA   5. Subdural hematoma - 07/2020 SDH after fall    6. 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  He was on daily aspirin, Plavix was added and statin was continued.  - neurology stopped plavix and started high dose aspirin    7. Dizzienss with standing - typically with getting  - caffeine free soda and coffee. 2 bottles of water per day, one 16 oz soda caffeine free, 1.5 cups of coffee in AM. Past Medical History:  Diagnosis Date   Anemia    Arthritis    Asthma    with exertion   Back pain    CAD (coronary artery disease)    a. s/p STEMI in 2004 with stenting to LAD and LCx b. low-risk NST in 2015   Cataracts, bilateral    immature   Colitis    just finished one antibiotic and Cipro will be finished up   COPD (chronic obstructive pulmonary disease) (HCC)    no meds required   Coronary artery disease     Diverticulosis    GERD (gastroesophageal reflux disease)    takes Omeprazole daily   Hemorrhoids    History of bronchitis    many yrs ago   History of colon polyps    Hyperlipidemia    takes Simvastatin nightly   Hypertension    takes Losartan and Metoprolol daily   Joint pain    Myocardial infarct (HCC) 09/17/2002   Paget disease of bone    takes Fosamax daily   Shortness of breath    URI (upper respiratory infection)    Dec 31,2014   Vertigo    takes Meclizine daily as needed     Allergies  Allergen Reactions   Codeine Nausea And Vomiting     Current Outpatient Medications  Medication Sig Dispense Refill   albuterol (PROVENTIL HFA;VENTOLIN HFA) 108 (90 Base) MCG/ACT inhaler Inhale 2 puffs into the lungs every 6 (six) hours as needed for wheezing or shortness of breath.     albuterol (PROVENTIL) (2.5 MG/3ML) 0.083% nebulizer solution Take 2.5 mg by nebulization every 6 (six) hours as needed for wheezing or shortness  of breath.     aspirin EC 325 MG tablet Take 325 mg by mouth daily with lunch.     benzonatate (TESSALON) 100 MG capsule Take 1 capsule (100 mg total) by mouth every 8 (eight) hours. (Patient taking differently: Take 100 mg by mouth 3 (three) times daily as needed for cough.) 21 capsule 0   cetirizine (ZYRTEC) 10 MG tablet Take 10 mg by mouth daily.     docusate sodium (COLACE) 100 MG capsule Take 1 capsule (100 mg total) by mouth 2 (two) times daily. 30 capsule 0   losartan (COZAAR) 100 MG tablet TAKE 1 TABLET BY MOUTH DAILY 90 tablet 2   methocarbamol (ROBAXIN) 500 MG tablet Take 500 mg by mouth 4 (four) times daily as needed.     metoprolol tartrate (LOPRESSOR) 25 MG tablet TAKE 1/2 TABLET BY MOUTH TWICE DAILY 90 tablet 1   Multiple Vitamin (MULTIVITAMIN) capsule Take 1 capsule by mouth daily in the afternoon. (Patient not taking: Reported on 08/20/2023)     nitroGLYCERIN (NITROSTAT) 0.4 MG SL tablet Place 1 tablet (0.4 mg total) under the tongue every 5  (five) minutes as needed. Chest pain 25 tablet 3   omeprazole (PRILOSEC) 40 MG capsule Take 40 mg by mouth daily.     oxyCODONE-acetaminophen (PERCOCET/ROXICET) 5-325 MG tablet Take 1-2 tablets by mouth every 6 (six) hours as needed for moderate pain (pain score 4-6) or severe pain (pain score 7-10). 30 tablet 0   predniSONE (DELTASONE) 20 MG tablet Take 2 tablets (40 mg total) by mouth daily with breakfast. 10 tablet 0   simvastatin (ZOCOR) 40 MG tablet TAKE 1 TABLET BY MOUTH DAILY AT BEDTIME 90 tablet 1   No current facility-administered medications for this visit.     Past Surgical History:  Procedure Laterality Date   ABDOMINAL EXPLORATION SURGERY     ANTERIOR LAT LUMBAR FUSION N/A 08/26/2023   Procedure: Extreme Lateral INTERBODY FUSION LUMBAR ONE-TWO;  Surgeon: Barnett Abu, MD;  Location: [facility] OR;  Service: Neurosurgery;  Laterality: N/A;  C3   APPENDECTOMY     BACK SURGERY     x 2   bilateral knee arthroscopies     x 3   CARPAL TUNNEL RELEASE Right    COLONOSCOPY     COLONOSCOPY WITH PROPOFOL N/A 01/23/2022   Procedure: COLONOSCOPY WITH PROPOFOL;  Surgeon: Dolores Frame, MD;  Location: [facility] ENDO SUITE;  Service: Gastroenterology;  Laterality: N/A;  10:10 ASA 2   CORONARY ANGIOPLASTY     3 stents   DENTAL SURGERY     ESOPHAGOGASTRODUODENOSCOPY     HERNIA REPAIR     inguinal   JOINT REPLACEMENT Left    LEFT HEART CATHETERIZATION WITH CORONARY ANGIOGRAM N/A 08/27/2011   Procedure: LEFT HEART CATHETERIZATION WITH CORONARY ANGIOGRAM;  Surgeon: Herby Abraham, MD;  Location: [facility] CATH LAB;  Service: Cardiovascular;  Laterality: N/A;   LUMBAR LAMINECTOMY WITH COFLEX 2 LEVEL N/A 01/03/2015   Procedure: Lumbar three-four,Lumbar four-five Laminectomy with Coflex;  Surgeon: Barnett Abu, MD;  Location: [facility] NEURO ORS;  Service: Neurosurgery;  Laterality: N/A;   POLYPECTOMY  01/23/2022   Procedure: POLYPECTOMY;  Surgeon: Dolores Frame, MD;  Location: [facility] END[facility] SUITE;   Service: Gastroenterology;;   TOTAL KNEE ARTHROPLASTY Right 11/16/2013   DR Turner Daniels   TOTAL KNEE ARTHROPLASTY Right 11/16/2013   Procedure: TOTAL KNEE ARTHROPLASTY;  Surgeon: Nestor Lewandowsky, MD;  Location: [facility] OR;  Service: Orthopedics;  Laterality: Right;   TOTAL KNEE ARTHROPLASTY Left  01/12/02     Allergies  Allergen Reactions   Codeine Nausea And Vomiting      Family History  Problem Relation Age of Onset   Heart attack Mother    Diabetes Mother    Emphysema Father    Atrial fibrillation Sister    Heart attack Brother    Heart attack Brother    Heart disease Brother    Breast cancer Sister      Social History Mr. Bourque reports that he quit smoking about 25 years ago. His smoking use included cigarettes. He started smoking about 65 years ago. He has never used smokeless tobacco. Mr. Watrous reports that he does not currently use alcohol.     Physical Examination Today's Vitals   11/11/23 1515  BP: 110/72  Pulse: 82  SpO2: 95%  Weight: 214 lb 6.4 oz (97.3 kg)  Height: 5\' 10"  (1.778 m)   Body mass index is 30.76 kg/m.  Gen: resting comfortably, no acute distress HEENT: no scleral icterus, pupils equal round and reactive, no palptable cervical adenopathy,  CV: RRR, no m/rg, no jvd Resp: Clear to auscultation bilaterally GI: abdomen is soft, non-tender, non-distended, normal bowel sounds, no hepatosplenomegaly MSK: extremities are warm, no edema.  Skin: warm, no rash Neuro:  no focal deficits Psych: appropriate affect   Diagnostic Studies  08/2003 Cath   FINDINGS:   1. Left main trunk: Large caliber vessel with mild irregularities.   2. LAD: This begins as a large caliber vessel and tapers in the distal   section. Provides two diagonal branches. The proximal LAD has mild   disease of 30%. The mid LAD has eccentric plaque of 50%. The distal LAD   has a high grade narrowing of 95% medially after the second diagonal   Cambrea Kirt with TIMI 2 flow distally. The first  diagonal Laprecious Austill has an   ostial narrowing of 50-60%. The second diagonal Frederick Marro has an ostial   narrowing of 30-40% and is a large caliber vessel.   3. Left circumflex artery: This is a medium caliber vessel. Provides   trivial first marginal Enola Siebers in the mid section and two marginal   branches distally. The proximal AV circumflex has high grade narrowing   of 70-80%. There is then further narrowing of 50-60% after the first   marginal Ming Mcmannis. The first marginal Fiana Gladu has long tubular narrowing of   50% in the proximal segment. The second marginal Radley Teston has an ostial   narrowing of 80%. The third marginal Allyna Pittsley has proximal narrowing of   70%.   4. Right coronary artery: Dominant. This is a large caliber vessel.   Provides the posterior descending artery and posterior ventricular Carollee Nussbaumer   in terminal segment. The right coronary artery has diffuse disease of 30-   40% in the mid and distal section.   5. LV: Normal end-systolic and end-diastolic dimensions. Overall left   ventricular function is well preserved. Ejection fraction greater than   55%. No mitral regurgitation. There is akinesis of a small portion of   the mid anterior wall. LV pressure is 110/10. Aortic is 110/60. LVEDP   equals 15.   DESCRIPTION OF PROCEDURE: With these findings we elected to proceed with   percutaneous intervention to the distal LAD. The patient was given heparin   systemically as well as Reopro to maintain an ACT of approximately 300   seconds using 600 mg of Plavix. During the case he developed some itching   and was given Benadryl  and Solu-Medrol with improvement of this. A 6-French   Q4 guide catheter was used to engage the left coronary artery and a 0.014   inch Asahi Prowater wire introduced. This was used to cross the stenosis   and position the distal LAD. A 2.5 x 15 mm Voyager balloon was introduced   and used to dilate the distal LAD lesion at 6 atmospheres for 120 seconds.   Repeat angiography  showed an improvement in vessel lumen. However, there   was severe residual plaque and it was felt that further intervention would   be required. We introduced a 2.75 x 15 mm cutting balloon and a total of   six inflations were performed within the distal LAD at up to 10 atmospheres   for 60 seconds. Repeat angiography showed modest improvement in vessel   lumen. However, in the LAO cranial views there still appeared to be   residual narrowing of 70-80% at the origin of the stenosis. Some plaque   shifting had occurred into the diagonal Tashiana Lamarca as well. While we tried to   avoid stent placement, this became unavoidable. We positioned a 2.5 x 18 mm   Cypher stent into the distal LAD and deployed this at 10 atmospheres for 30   seconds. A 2.75 x 15 mm Quantum Maverick balloon was then used to post   dilate the stent. Two inflations were performed up to 14 atmospheres for 30   seconds each. Repeat angiography was then performed after the   administration of intracoronary nitroglycerin showing excellent result with   no residual stenosis in the distal LAD and improvement of TIMI grade 2 to   TIMI grade 3 flow in the distal LAD. There was some plaque shift to the   origin of the second diagonal Emmagene Ortner of 70-80%. However, there was still   TIMI 3 flow in this vessel. We decided to accept this result. Final   angiography was performed in various projections showing no distal vessel   damage. The guide catheter was then removed. An AngioSeal closure device   was then deployed to the right femoral artery due to mild oozing around the   sheath. There was persistence of oozing and a FemStop was positioned until   hemostasis was achieved. The patient then transferred to the CCU in stable   condition. He tolerated the procedure well.   FINAL RESULTS: Successful PTCA and stent placement in the distal LAD with   reduction of 95% narrowing to 0% with placement of a 2.5 x 18 mm Cypher drug-   eluting stent  dilated to 2.75 mm and improvement of TIMI grade 2 to TIMI   grade 3 flow.     08/2011 Echo   LVEF 50-55%, grade I diastolic dysfunction, difficult study.     09/04/13 EKG   Normal sinus rhythm, no ischemic changes     Jan 2015 Stress MPI IMPRESSION: Low risk Lexiscan Cardiolite. No diagnostic ST segment changes were noted. Rare PACs observed. Perfusion imaging is consistent with diaphragmatic attenuation affecting the inferior wall, no clear evidence of scar or ischemia. LV volumes are normal and LVEF is 62% without wall motion abnormality.   09/25/14 Clinic EKG SR with PACs     06/2017 PFTs Mild ventilatory defect, +air trapping, moderately reduced DLCO     10/2020 Echo [facility] Summary   1. Technically difficult study.    2. The left ventricle is normal in size with mildly increased wall  thickness.   3.  The left ventricular systolic function is normal, LVEF is visually  estimated at 65-70%.    4. There is grade I diastolic dysfunction (impaired relaxation).    5. The aortic valve is trileaflet with mildly thickened leaflets with normal  excursion.   6. The left atrium is mildly dilated in size.    7. The right ventricle is upper normal in size, with normal systolic  function.    Assessment and Plan   1. CAD   -no chest pains, chronic SOB/DOE I think more related to COPD and recently being sedentary due to back surgery continue current meds     2 . Hyperlipidemia - reports muscle aches on more potent statins, has tolerated simvastatin - LDL above goal, add zetia 10mg  daily.      3. HTN - at goal, continue current meds      Antoine Poche, M.D.

## 2023-11-11 NOTE — Patient Instructions (Addendum)
 Medication Instructions:  Your physician has recommended you make the following change in your medication:  Start zetia 10 mg daily Continue all other medications as prescribed  Labwork: none  Testing/Procedures: none  Follow-Up: Your physician recommends that you schedule a follow-up appointment in: 6 months  Any Other Special Instructions Will Be Listed Below (If Applicable).  If you need a refill on your cardiac medications before your next appointment, please call your pharmacy.

## 2023-11-12 DIAGNOSIS — L570 Actinic keratosis: Secondary | ICD-10-CM | POA: Diagnosis not present

## 2023-11-13 ENCOUNTER — Ambulatory Visit (HOSPITAL_COMMUNITY)
Admission: RE | Admit: 2023-11-13 | Discharge: 2023-11-13 | Disposition: A | Discharge status: Home / Self Care | Payer: Medicare Other | Source: Ambulatory Visit | Attending: Neurological Surgery | Admitting: Neurological Surgery

## 2023-11-13 DIAGNOSIS — M5135 Other intervertebral disc degeneration, thoracolumbar region: Secondary | ICD-10-CM | POA: Diagnosis not present

## 2023-11-13 DIAGNOSIS — M48062 Spinal stenosis, lumbar region with neurogenic claudication: Secondary | ICD-10-CM | POA: Diagnosis not present

## 2023-11-13 DIAGNOSIS — M5136 Other intervertebral disc degeneration, lumbar region with discogenic back pain only: Secondary | ICD-10-CM | POA: Diagnosis not present

## 2023-11-13 DIAGNOSIS — M48061 Spinal stenosis, lumbar region without neurogenic claudication: Secondary | ICD-10-CM | POA: Diagnosis not present

## 2023-11-13 DIAGNOSIS — M47816 Spondylosis without myelopathy or radiculopathy, lumbar region: Secondary | ICD-10-CM | POA: Diagnosis not present

## 2023-11-13 MED ORDER — GADOBUTROL 1 MMOL/ML IV SOLN
10.0000 mL | Freq: Once | INTRAVENOUS | Status: AC | PRN
  Administered 2023-11-13: 10 mL via INTRAVENOUS

## 2023-11-27 ENCOUNTER — Other Ambulatory Visit: Payer: Self-pay | Admitting: Neurological Surgery

## 2023-11-28 NOTE — Progress Notes (Signed)
 Surgical Instructions   Your procedure is scheduled on Friday, December 10, 2023. Report to [facility] Main Entrance "A" at 7:00 A.M., then check in with the Admitting office. Any questions or running late day of surgery: call [phone]  Questions prior to your surgery date: call [phone], Monday-Friday, 8am-4pm. If you experience any cold or flu symptoms such as cough, fever, chills, shortness of breath, etc. between now and your scheduled surgery, please notify us at the above number.     Remember:  Do not eat or drink after midnight the night before your surgery   Take these medicines the morning of surgery with A SIP OF WATER    carvedilol (COREG)  cetirizine (ZYRTEC)  ezetimibe (ZETIA) omeprazole (PRILOSEC)    May take these medicines IF NEEDED: Albuterol inhaler - please bring with you on day of surgery benzonatate (TESSALON) HYDROcodone-acetaminophen (NORCO/VICODIN)  nitroGLYCERIN (NITROSTAT)     Follow your surgeon's instructions on when to stop taking your ASPIRIN. IF you did not receive those instructions from your surgeon, you will need to call the office and get them.   One week prior to surgery, STOP taking any Aleve, Naproxen, Ibuprofen, Motrin, Advil, Goody's, BC's, all herbal medications, fish oil, and non-prescription vitamins.                     Do NOT Smoke (Tobacco/Vaping) for 24 hours prior to your procedure.  If you use a CPAP at night, you may bring your mask/headgear for your overnight stay.   You will be asked to remove any contacts, glasses, piercing's, hearing aid's, dentures/partials prior to surgery. Please bring cases for these items if needed.    Patients discharged the day of surgery will not be allowed to drive home, and someone needs to stay with them for 24 hours.  SURGICAL WAITING ROOM VISITATION Patients may have no more than 2 support people in the waiting area - these visitors may rotate.   Pre-op nurse will coordinate an  appropriate time for 1 ADULT support person, who may not rotate, to accompany patient in pre-op.  Children under the age of 73 must have an adult with them who is not the patient and must remain in the main waiting area with an adult.  If the patient needs to stay at the hospital during part of their recovery, the visitor guidelines for inpatient rooms apply.  Please refer to the [facility] website for the visitor guidelines for any additional information.   If you received a COVID test during your pre-op visit  it is requested that you wear a mask when out in public, stay away from anyone that may not be feeling well and notify your surgeon if you develop symptoms. If you have been in contact with anyone that has tested positive in the last 10 days please notify you surgeon.      Pre-operative 5 CHG Bathing Instructions   You can play a key role in reducing the risk of infection after surgery. Your skin needs to be as free of germs as possible. You can reduce the number of germs on your skin by washing with CHG (chlorhexidine gluconate) soap before surgery. CHG is an antiseptic soap that kills germs and continues to kill germs even after washing.   DO NOT use if you have an allergy to chlorhexidine/CHG or antibacterial soaps. If your skin becomes reddened or irritated, stop using the CHG and notify one of our RNs at [phone].   Please  shower with the CHG soap starting 4 days before surgery using the following schedule:     Please keep in mind the following:  DO NOT shave, including legs and underarms, starting the day of your first shower.   You may shave your face at any point before/day of surgery.  Place clean sheets on your bed the day you start using CHG soap. Use a clean washcloth (not used since being washed) for each shower. DO NOT sleep with pets once you start using the CHG.   CHG Shower Instructions:  Wash your face and private area with normal soap. If you choose to  wash your hair, wash first with your normal shampoo.  After you use shampoo/soap, rinse your hair and body thoroughly to remove shampoo/soap residue.  Turn the water OFF and apply about 3 tablespoons (45 ml) of CHG soap to a CLEAN washcloth.  Apply CHG soap ONLY FROM YOUR NECK DOWN TO YOUR TOES (washing for 3-5 minutes)  DO NOT use CHG soap on face, private areas, open wounds, or sores.  Pay special attention to the area where your surgery is being performed.  If you are having back surgery, having someone wash your back for you may be helpful. Wait 2 minutes after CHG soap is applied, then you may rinse off the CHG soap.  Pat dry with a clean towel  Put on clean clothes/pajamas   If you choose to wear lotion, please use ONLY the CHG-compatible lotions that are listed below.  Additional instructions for the day of surgery: DO NOT APPLY any lotions, deodorants, cologne, or perfumes.   Do not bring valuables to the hospital. [facility] is not responsible for any belongings/valuables. Do not wear nail polish, gel polish, artificial nails, or any other type of covering on natural nails (fingers and toes) Do not wear jewelry or makeup Put on clean/comfortable clothes.  Please brush your teeth.  Ask your nurse before applying any prescription medications to the skin.     CHG Compatible Lotions   Aveeno Moisturizing lotion  Cetaphil Moisturizing Cream  Cetaphil Moisturizing Lotion  Clairol Herbal Essence Moisturizing Lotion, Dry Skin  Clairol Herbal Essence Moisturizing Lotion, Extra Dry Skin  Clairol Herbal Essence Moisturizing Lotion, Normal Skin  Curel Age Defying Therapeutic Moisturizing Lotion with Alpha Hydroxy  Curel Extreme Care Body Lotion  Curel Soothing Hands Moisturizing Hand Lotion  Curel Therapeutic Moisturizing Cream, Fragrance-Free  Curel Therapeutic Moisturizing Lotion, Fragrance-Free  Curel Therapeutic Moisturizing Lotion, Original Formula  Eucerin Daily Replenishing  Lotion  Eucerin Dry Skin Therapy Plus Alpha Hydroxy Crme  Eucerin Dry Skin Therapy Plus Alpha Hydroxy Lotion  Eucerin Original Crme  Eucerin Original Lotion  Eucerin Plus Crme Eucerin Plus Lotion  Eucerin TriLipid Replenishing Lotion  Keri Anti-Bacterial Hand Lotion  Keri Deep Conditioning Original Lotion Dry Skin Formula Softly Scented  Keri Deep Conditioning Original Lotion, Fragrance Free Sensitive Skin Formula  Keri Lotion Fast Absorbing Fragrance Free Sensitive Skin Formula  Keri Lotion Fast Absorbing Softly Scented Dry Skin Formula  Keri Original Lotion  Keri Skin Renewal Lotion Keri Silky Smooth Lotion  Keri Silky Smooth Sensitive Skin Lotion  Nivea Body Creamy Conditioning Oil  Nivea Body Extra Enriched Teacher, adult education Moisturizing Lotion Nivea Crme  Nivea Skin Firming Lotion  NutraDerm 30 Skin Lotion  NutraDerm Skin Lotion  NutraDerm Therapeutic Skin Cream  NutraDerm Therapeutic Skin Lotion  ProShield Protective Hand Cream  Provon moisturizing lotion  Please read over  the following fact sheets that you were given.

## 2023-11-29 ENCOUNTER — Other Ambulatory Visit: Payer: Self-pay

## 2023-11-29 ENCOUNTER — Encounter (HOSPITAL_COMMUNITY): Payer: Self-pay

## 2023-11-29 ENCOUNTER — Encounter (HOSPITAL_COMMUNITY)
Admission: RE | Admit: 2023-11-29 | Discharge: 2023-11-29 | Disposition: A | Discharge status: Home / Self Care | Source: Ambulatory Visit | Attending: Neurological Surgery | Admitting: Neurological Surgery

## 2023-11-29 VITALS — BP 158/82 | HR 65 | Temp 97.8°F | Resp 17 | Ht 70.0 in | Wt 212.4 lb

## 2023-11-29 DIAGNOSIS — E785 Hyperlipidemia, unspecified: Secondary | ICD-10-CM | POA: Insufficient documentation

## 2023-11-29 DIAGNOSIS — K219 Gastro-esophageal reflux disease without esophagitis: Secondary | ICD-10-CM | POA: Insufficient documentation

## 2023-11-29 DIAGNOSIS — I10 Essential (primary) hypertension: Secondary | ICD-10-CM | POA: Diagnosis not present

## 2023-11-29 DIAGNOSIS — M5126 Other intervertebral disc displacement, lumbar region: Secondary | ICD-10-CM | POA: Insufficient documentation

## 2023-11-29 DIAGNOSIS — J4489 Other specified chronic obstructive pulmonary disease: Secondary | ICD-10-CM | POA: Insufficient documentation

## 2023-11-29 DIAGNOSIS — I252 Old myocardial infarction: Secondary | ICD-10-CM | POA: Insufficient documentation

## 2023-11-29 DIAGNOSIS — Z01812 Encounter for preprocedural laboratory examination: Secondary | ICD-10-CM | POA: Insufficient documentation

## 2023-11-29 DIAGNOSIS — I2510 Atherosclerotic heart disease of native coronary artery without angina pectoris: Secondary | ICD-10-CM | POA: Insufficient documentation

## 2023-11-29 DIAGNOSIS — Z87891 Personal history of nicotine dependence: Secondary | ICD-10-CM | POA: Insufficient documentation

## 2023-11-29 DIAGNOSIS — Z01818 Encounter for other preprocedural examination: Secondary | ICD-10-CM

## 2023-11-29 DIAGNOSIS — E119 Type 2 diabetes mellitus without complications: Secondary | ICD-10-CM | POA: Insufficient documentation

## 2023-11-29 DIAGNOSIS — Z8673 Personal history of transient ischemic attack (TIA), and cerebral infarction without residual deficits: Secondary | ICD-10-CM | POA: Insufficient documentation

## 2023-11-29 LAB — BASIC METABOLIC PANEL
Anion gap: 10 (ref 5–15)
BUN: 11 mg/dL (ref 8–23)
CO2: 24 mmol/L (ref 22–32)
Calcium: 9.5 mg/dL (ref 8.9–10.3)
Chloride: 102 mmol/L (ref 98–111)
Creatinine, Ser: 1.05 mg/dL (ref 0.61–1.24)
GFR, Estimated: >60 mL/min (ref >60)
Glucose, Bld: 320 mg/dL — ABNORMAL HIGH (ref 70–99)
Potassium: 4.5 mmol/L (ref 3.5–5.1)
Sodium: 136 mmol/L (ref 135–145)

## 2023-11-29 LAB — SURGICAL PCR SCREEN
MRSA, PCR: POSITIVE — AB
Staphylococcus aureus: POSITIVE — AB

## 2023-11-29 LAB — CBC
HCT: 39.8 % (ref 39.0–52.0)
Hemoglobin: 13.1 g/dL (ref 13.0–17.0)
MCH: 28.7 pg (ref 26.0–34.0)
MCHC: 32.9 g/dL (ref 30.0–36.0)
MCV: 87.1 fL (ref 80.0–100.0)
Platelets: 227 10*3/uL (ref 150–400)
RBC: 4.57 MIL/uL (ref 4.22–5.81)
RDW: 13.7 % (ref 11.5–15.5)
WBC: 7.8 10*3/uL (ref 4.0–10.5)
nRBC: 0 % (ref 0.0–0.2)

## 2023-11-29 LAB — TYPE AND SCREEN
ABO/RH(D): O POS
Antibody Screen: NEGATIVE

## 2023-11-29 NOTE — Progress Notes (Signed)
 RN contacted Angie at Dr. Verlee Rossetti office and informed the patient's PCR results.

## 2023-11-29 NOTE — Progress Notes (Signed)
 PCP - [facility] in [place], whoever is there as they are in process of getting a new physician Cardiologist - Dina Rich LOV 11-11-23, follow up in 6 months  PPM/ICD - Denies Device Orders - n/a Rep Notified - n/a  Chest x-ray - N/A EKG - 08-28-23 Stress Test - 01-16-22 ECHO - 08-29-11 Cardiac Cath - 08-2003  Sleep Study - Denies CPAP - n/a  NON-diabetic  Last dose of GLP1 agonist-  Denies GLP1 instructions: N/A  Blood Thinner Instructions: Denis Aspirin Instructions: states he was instructed to stop 7 days prior with last dose on 12/02/23  ERAS Protcol - NPO PRE-SURGERY Ensure or G2- N/A  COVID TEST- N/A   Anesthesia review: Yes, HTN, COPD, STROKE, MI  Patient denies shortness of breath, fever, cough and chest pain at PAT appointment. Patient denies any respiratory issues at this time .   All instructions explained to the patient, with a verbal understanding of the material. Patient agrees to go over the instructions while at home for a better understanding. Patient also instructed to self quarantine after being tested for COVID-19. The opportunity to ask questions was provided.

## 2023-12-02 ENCOUNTER — Encounter (HOSPITAL_COMMUNITY): Payer: Self-pay

## 2023-12-02 NOTE — Anesthesia Preprocedure Evaluation (Addendum)
 Anesthesia Evaluation  Patient identified by MRN, date of birth, ID band Patient awake    Reviewed: Allergy & Precautions, H&P , NPO status , Patient's Chart, lab work & pertinent test results  Airway Mallampati: II   Neck ROM: full    Dental   Pulmonary shortness of breath, asthma , COPD, former smoker   breath sounds clear to auscultation       Cardiovascular hypertension, + CAD, + Past MI and + Cardiac Stents   Rhythm:regular Rate:Normal     Neuro/Psych    GI/Hepatic ,GERD  ,,  Endo/Other    Renal/GU      Musculoskeletal  (+) Arthritis ,    Abdominal   Peds  Hematology   Anesthesia Other Findings   Reproductive/Obstetrics                             Anesthesia Physical Anesthesia Plan  ASA: 3  Anesthesia Plan: General   Post-op Pain Management:    Induction: Intravenous  PONV Risk Score and Plan: 2 and Ondansetron, Dexamethasone and Treatment may vary due to age or medical condition  Airway Management Planned: Oral ETT  Additional Equipment:   Intra-op Plan:   Post-operative Plan: Extubation in OR  Informed Consent: I have reviewed the patients History and Physical, chart, labs and discussed the procedure including the risks, benefits and alternatives for the proposed anesthesia with the patient or authorized representative who has indicated his/her understanding and acceptance.     Dental advisory given  Plan Discussed with: Anesthesiologist, CRNA and Surgeon  Anesthesia Plan Comments: (See PAT note from 3/14 by Sherlie Ban PA-C )        Anesthesia Quick Evaluation

## 2023-12-02 NOTE — Progress Notes (Signed)
 Case: [id number] Date/Time: 12/10/23 0845   Procedure: FORAMINOTOMY 1 LEVEL (Bilateral) - Microdiscectomy - L1-L2 - bilateral   Anesthesia type: General   Diagnosis: Herniated nucleus pulposus, lumbar [M51.26]   Pre-op diagnosis: Herniated nucleus pulposus, lumbar   [facility]OM [facility] / [facility]   Surgeons: Barnett Abu, MD       DISCUSSION: [name] [name] is a 78 yo male who presents to PAT prior to surgery above. PMH of former smoking, HTN, hx of STEMI and CAD s/p PCI in 2004, asthma/COPD, hx of CVA (2022), GERD, diabetes, Pagets disease, arthritis, hx of lumbar fusion (2016, 08/26/2023).  78 yo male follows with cardiology for hx of CAD s/p STEMI in 2004 with stent to LAD and Lcx, HLD, HTN, CVA. Nuclear stress 01/2022 showed inferior infarct, no current ischemia.  Last echo in 2022 showed normal LVEF with grade I DD. Last seen by Dr. Wyline Mood on 11/11/23. Noted to have chronic SOB/DOE which is thought to be from deconditioning. Advised continue current medical management.  Patient with hx of Diabetes. Last A1c was 7.3 on 09/05/23 labs. He does not currently take any medicine for this. Also with hx of COPD. Prescribed albuterol.  VS: BP (!) 158/82   Pulse 65   Temp 36.6 C   Resp 17   Ht 5\' 10"  (1.778 m)   Wt 96.3 kg   SpO2 95%   BMI 30.48 kg/m   PROVIDERS: Carmine Savoy A, DO   LABS: Labs reviewed: Acceptable for surgery. (all labs ordered are listed, but only abnormal results are displayed)  Labs Reviewed  SURGICAL PCR SCREEN - Abnormal; Notable for the following components:      Result Value   MRSA, PCR POSITIVE (*)    Staphylococcus aureus POSITIVE (*)    All other components within normal limits  BASIC METABOLIC PANEL - Abnormal; Notable for the following components:   Glucose, Bld 320 (*)    All other components within normal limits  CBC  TYPE AND SCREEN     IMAGES:  CT chest 05/21/2023 [facility] Everywhere): 1. The lesion of concern at the right lung apex is a  calcified  granuloma and warrants no further workup.  2. Airway thickening is present, suggesting bronchitis or reactive  airways disease.  3. Punctate nonobstructive right kidney upper pole renal calculi.  4. Thoracic spondylosis and degenerative disc disease. Midline  posterior intervertebral spurring at the T8-9 level could contribute  to mild central narrowing of the thecal sac.  5. Atelectasis or scarring in the posterior basal segment left lower  lobe.  6. 6 by 3 by 2 mm (volume = 20 mm^3) solid noncalcified right  upper lobe nodule along the minor fissure is likely a subpleural  lymph node. No follow-up needed if patient is low-risk.This  recommendation follows the consensus statement: Guidelines for  Management of Incidental Pulmonary Nodules Detected on CT Images:  From the Fleischner Society 2017; Radiology 2017; 284:228-243.  7. Aortic, systemic, and coronary atherosclerosis.   EKG 08/28/23:  Normal sinus rhythm, rate 68 Low voltage QRS  CV:  Nuclear stress 01/16/22:   Findings are consistent with prior inferior myocardial infarction. There is no current ischemia The study is low risk.   No ST deviation was noted.   LV perfusion is abnormal. Large moderate intensity fixed inferior defect.   Left ventricular function is normal. Nuclear stress EF: 64 %. The left ventricular ejection fraction is normal (55-65%). End diastolic cavity size is normal.  Echo  10/31/2020 [facility]):  Summary   1. Technically difficult study.    2. The left ventricle is normal in size with mildly increased wall  thickness.   3. The left ventricular systolic function is normal, LVEF is visually  estimated at 65-70%.    4. There is grade I diastolic dysfunction (impaired relaxation).    5. The aortic valve is trileaflet with mildly thickened leaflets with normal  excursion.   6. The left atrium is mildly dilated in size.    7. The right ventricle is upper normal in size, with normal systolic   function.  Past Medical History:  Diagnosis Date   Anemia    Arthritis    Asthma    with exertion   Back pain    CAD (coronary artery disease)    a. s/p STEMI in 2004 with stenting to LAD and LCx b. low-risk NST in 2015   Cataracts, bilateral    immature   Colitis    just finished one antibiotic and Cipro will be finished up   COPD (chronic obstructive pulmonary disease) (HCC)    no meds required   Coronary artery disease    Diverticulosis    GERD (gastroesophageal reflux disease)    takes Omeprazole daily   Hemorrhoids    History of bronchitis    many yrs ago   History of colon polyps    Hyperlipidemia    takes Simvastatin nightly   Hypertension    takes Losartan and Metoprolol daily   Joint pain    Myocardial infarct (HCC) 09/17/2002   Paget disease of bone    takes Fosamax daily   Shortness of breath    URI (upper respiratory infection)    Dec 31,2014   Vertigo    takes Meclizine daily as needed    Past Surgical History:  Procedure Laterality Date   ABDOMINAL EXPLORATION SURGERY     ANTERIOR LAT LUMBAR FUSION N/A 08/26/2023   Procedure: Extreme Lateral INTERBODY FUSION LUMBAR ONE-TWO;  Surgeon: Barnett Abu, MD;  Location: [facility];  Service: Neurosurgery;  Laterality: N/A;  C3   APPENDECTOMY     BACK SURGERY     x 2   bilateral knee arthroscopies     x 3   CARPAL TUNNEL RELEASE Right    COLONOSCOPY     COLONOSCOPY WITH PROPOFOL N/A 01/23/2022   Procedure: COLONOSCOPY WITH PROPOFOL;  Surgeon: Dolores Frame, MD;  [facility]: [facility];  Service: Gastroenterology;  Laterality: N/A;  10:10 ASA 2   CORONARY ANGIOPLASTY     3 stents   DENTAL SURGERY     ESOPHAGOGASTRODUODENOSCOPY     HERNIA REPAIR     inguinal   JOINT REPLACEMENT Left    LEFT HEART CATHETERIZATION WITH CORONARY ANGIOGRAM N/A 08/27/2011   Procedure: LEFT HEART CATHETERIZATION WITH CORONARY ANGIOGRAM;  Surgeon: Herby Abraham, MD;  Location: [facility] CATH LAB;  Service: Cardiovascular;   Laterality: N/A;   LUMBAR LAMINECTOMY WITH COFLEX 2 LEVEL N/A 01/03/2015   Procedure: Lumbar three-four,Lumbar four-five Laminectomy with Coflex;  Surgeon: Barnett Abu, MD;  [facility] NEURO ORS;  Service: Neurosurgery;  Laterality: N/A;   POLYPECTOMY  01/23/2022   Procedure: POLYPECTOMY;  Surgeon: Dolores Frame, MD;  [facility];  Service: Gastroenterology;;   TOTAL KNEE ARTHROPLASTY Right 11/16/2013   DR Turner Daniels   TOTAL KNEE ARTHROPLASTY Right 11/16/2013   Procedure: TOTAL KNEE ARTHROPLASTY;  Surgeon: Nestor Lewandowsky, MD;  Location[facility] OR;  Service: Orthopedics;  Laterality: Right;   TOTAL KNEE ARTHROPLASTY Left 01/12/02    MEDICATIONS:  albuterol (PROVENTIL HFA;VENTOLIN HFA) 108 (90 Base) MCG/ACT inhaler   albuterol (PROVENTIL) (2.5 MG/3ML) 0.083% nebulizer solution   aspirin EC 325 MG tablet   benzonatate (TESSALON) 100 MG capsule   carvedilol (COREG) 6.25 MG tablet   cetirizine (ZYRTEC) 10 MG tablet   ezetimibe (ZETIA) 10 MG tablet   HYDROcodone-acetaminophen (NORCO/VICODIN) 5-325 MG tablet   losartan (COZAAR) 100 MG tablet   Multiple Vitamin (MULTIVITAMIN) capsule   nitroGLYCERIN (NITROSTAT) 0.4 MG SL tablet   omeprazole (PRILOSEC) 40 MG capsule   simvastatin (ZOCOR) 40 MG tablet   No current facility-administered medications for this encounter.   Marcille Blanco [facility] Surgical Short Stay/Anesthesiology [facility] Phone [phone] 12/02/2023 1:45 PM

## 2023-12-03 ENCOUNTER — Other Ambulatory Visit: Payer: Self-pay | Admitting: Neurological Surgery

## 2023-12-10 ENCOUNTER — Encounter (HOSPITAL_COMMUNITY)
Admission: RE | Disposition: A | Discharge status: Home / Self Care | Payer: Self-pay | Source: Ambulatory Visit | Attending: Neurological Surgery

## 2023-12-10 ENCOUNTER — Encounter (HOSPITAL_COMMUNITY): Payer: Self-pay | Admitting: Neurological Surgery

## 2023-12-10 ENCOUNTER — Other Ambulatory Visit: Payer: Self-pay

## 2023-12-10 ENCOUNTER — Ambulatory Visit (HOSPITAL_COMMUNITY)

## 2023-12-10 ENCOUNTER — Ambulatory Visit (HOSPITAL_BASED_OUTPATIENT_CLINIC_OR_DEPARTMENT_OTHER)

## 2023-12-10 ENCOUNTER — Observation Stay (HOSPITAL_COMMUNITY)
Admission: RE | Admit: 2023-12-10 | Discharge: 2023-12-11 | Disposition: A | Discharge status: Home / Self Care | Source: Ambulatory Visit | Attending: Neurological Surgery | Admitting: Neurological Surgery

## 2023-12-10 ENCOUNTER — Ambulatory Visit (HOSPITAL_COMMUNITY): Payer: Self-pay | Admitting: Medical

## 2023-12-10 DIAGNOSIS — Z7982 Long term (current) use of aspirin: Secondary | ICD-10-CM | POA: Diagnosis not present

## 2023-12-10 DIAGNOSIS — J449 Chronic obstructive pulmonary disease, unspecified: Secondary | ICD-10-CM | POA: Insufficient documentation

## 2023-12-10 DIAGNOSIS — I10 Essential (primary) hypertension: Secondary | ICD-10-CM | POA: Insufficient documentation

## 2023-12-10 DIAGNOSIS — Z87891 Personal history of nicotine dependence: Secondary | ICD-10-CM | POA: Diagnosis not present

## 2023-12-10 DIAGNOSIS — Z96653 Presence of artificial knee joint, bilateral: Secondary | ICD-10-CM | POA: Diagnosis not present

## 2023-12-10 DIAGNOSIS — I2510 Atherosclerotic heart disease of native coronary artery without angina pectoris: Secondary | ICD-10-CM

## 2023-12-10 DIAGNOSIS — G834 Cauda equina syndrome: Secondary | ICD-10-CM | POA: Insufficient documentation

## 2023-12-10 DIAGNOSIS — Z79899 Other long term (current) drug therapy: Secondary | ICD-10-CM | POA: Diagnosis not present

## 2023-12-10 DIAGNOSIS — M5126 Other intervertebral disc displacement, lumbar region: Secondary | ICD-10-CM | POA: Diagnosis not present

## 2023-12-10 DIAGNOSIS — M5186 Other intervertebral disc disorders, lumbar region: Secondary | ICD-10-CM | POA: Diagnosis not present

## 2023-12-10 DIAGNOSIS — Z981 Arthrodesis status: Secondary | ICD-10-CM | POA: Diagnosis not present

## 2023-12-10 LAB — HEMOGLOBIN A1C
Hgb A1c MFr Bld: 9.1 % — ABNORMAL HIGH (ref 4.8–5.6)
Mean Plasma Glucose: 214.47 mg/dL

## 2023-12-10 LAB — CBC
HCT: 37.7 % — ABNORMAL LOW (ref 39.0–52.0)
Hemoglobin: 12.6 g/dL — ABNORMAL LOW (ref 13.0–17.0)
MCH: 28.4 pg (ref 26.0–34.0)
MCHC: 33.4 g/dL (ref 30.0–36.0)
MCV: 84.9 fL (ref 80.0–100.0)
Platelets: 203 10*3/uL (ref 150–400)
RBC: 4.44 MIL/uL (ref 4.22–5.81)
RDW: 13.3 % (ref 11.5–15.5)
WBC: 10.5 10*3/uL (ref 4.0–10.5)
nRBC: 0 % (ref 0.0–0.2)

## 2023-12-10 LAB — GLUCOSE, CAPILLARY
Glucose-Capillary: 200 mg/dL — ABNORMAL HIGH (ref 70–99)
Glucose-Capillary: 180 mg/dL — ABNORMAL HIGH (ref 70–99)
Glucose-Capillary: 167 mg/dL — ABNORMAL HIGH (ref 70–99)
Glucose-Capillary: 348 mg/dL — ABNORMAL HIGH (ref 70–99)
Glucose-Capillary: 303 mg/dL — ABNORMAL HIGH (ref 70–99)

## 2023-12-10 LAB — CREATININE, SERUM
Creatinine, Ser: 1.06 mg/dL (ref 0.61–1.24)
GFR, Estimated: >60 mL/min (ref >60)

## 2023-12-10 SURGERY — FORAMINOTOMY 1 LEVEL
Anesthesia: General | Site: Spine Lumbar | Laterality: Bilateral

## 2023-12-10 MED ORDER — MUPIROCIN 2 % EX OINT: 1.0000 | TOPICAL_OINTMENT | Freq: Two times a day (BID) | CUTANEOUS | 0 refills | Status: AC

## 2023-12-10 MED ORDER — MENTHOL 3 MG MT LOZG: 1.0000 | LOZENGE | OROMUCOSAL | Status: DC | PRN

## 2023-12-10 MED ORDER — SODIUM CHLORIDE 0.9% FLUSH
3.0000 mL | Freq: Two times a day (BID) | INTRAVENOUS | Status: DC
  Administered 2023-12-10 (×2): 3 mL via INTRAVENOUS

## 2023-12-10 MED ORDER — INSULIN ASPART 100 UNIT/ML IJ SOLN
0.0000 [IU] | Freq: Three times a day (TID) | INTRAMUSCULAR | Status: DC
  Administered 2023-12-11: 7 [IU] via SUBCUTANEOUS

## 2023-12-10 MED ORDER — BENZONATATE 100 MG PO CAPS
100.0000 mg | ORAL_CAPSULE | Freq: Three times a day (TID) | ORAL | Status: DC | PRN
  Filled 2023-12-10: qty 1

## 2023-12-10 MED ORDER — PANTOPRAZOLE SODIUM 40 MG PO TBEC
80.0000 mg | DELAYED_RELEASE_TABLET | Freq: Every day | ORAL | Status: DC
  Administered 2023-12-11: 80 mg via ORAL
  Filled 2023-12-10: qty 2

## 2023-12-10 MED ORDER — CHLORHEXIDINE GLUCONATE 0.12 % MT SOLN
15.0000 mL | Freq: Once | OROMUCOSAL | Status: AC
  Administered 2023-12-10: 15 mL via OROMUCOSAL
  Filled 2023-12-10: qty 15

## 2023-12-10 MED ORDER — LIVING WELL WITH DIABETES BOOK
Freq: Once | Status: DC
  Filled 2023-12-10: qty 1

## 2023-12-10 MED ORDER — NITROGLYCERIN 0.4 MG SL SUBL: 0.4000 mg | SUBLINGUAL_TABLET | SUBLINGUAL | Status: DC | PRN

## 2023-12-10 MED ORDER — FENTANYL CITRATE (PF) 100 MCG/2ML IJ SOLN: 25.0000 ug | INTRAMUSCULAR | Status: DC | PRN

## 2023-12-10 MED ORDER — ROCURONIUM BROMIDE 10 MG/ML (PF) SYRINGE
PREFILLED_SYRINGE | INTRAVENOUS | Status: DC | PRN
Start: 2023-12-10 — End: 2023-12-10
  Administered 2023-12-10 (×2): 10 mg via INTRAVENOUS
  Administered 2023-12-10: 50 mg via INTRAVENOUS

## 2023-12-10 MED ORDER — SODIUM CHLORIDE 0.9 % IV SOLN
250.0000 mL | INTRAVENOUS | Status: DC
Start: 2023-12-10 — End: 2023-12-11

## 2023-12-10 MED ORDER — PHENOL 1.4 % MT LIQD: 1.0000 | OROMUCOSAL | Status: DC | PRN

## 2023-12-10 MED ORDER — THROMBIN 5000 UNITS EX KIT
PACK | CUTANEOUS | Status: AC
  Filled 2023-12-10: qty 1

## 2023-12-10 MED ORDER — 0.9 % SODIUM CHLORIDE (POUR BTL) OPTIME
TOPICAL | Status: DC | PRN
  Administered 2023-12-10: 1000 mL

## 2023-12-10 MED ORDER — ROCURONIUM BROMIDE 10 MG/ML (PF) SYRINGE
PREFILLED_SYRINGE | INTRAVENOUS | Status: AC
  Filled 2023-12-10: qty 10

## 2023-12-10 MED ORDER — ACETAMINOPHEN 325 MG PO TABS: 650.0000 mg | ORAL_TABLET | ORAL | Status: DC | PRN

## 2023-12-10 MED ORDER — PROPOFOL 10 MG/ML IV BOLUS
INTRAVENOUS | Status: DC | PRN
Start: 2023-12-10 — End: 2023-12-10
  Administered 2023-12-10: 110 mg via INTRAVENOUS

## 2023-12-10 MED ORDER — INSULIN ASPART 100 UNIT/ML IJ SOLN
0.0000 [IU] | Freq: Three times a day (TID) | INTRAMUSCULAR | Status: DC
  Administered 2023-12-10: 15 [IU] via SUBCUTANEOUS

## 2023-12-10 MED ORDER — ONDANSETRON HCL 4 MG/2ML IJ SOLN: 4.0000 mg | Freq: Four times a day (QID) | INTRAMUSCULAR | Status: DC | PRN

## 2023-12-10 MED ORDER — PHENYLEPHRINE 80 MCG/ML (10ML) SYRINGE FOR IV PUSH (FOR BLOOD PRESSURE SUPPORT)
PREFILLED_SYRINGE | INTRAVENOUS | Status: DC | PRN
  Administered 2023-12-10: 80 ug via INTRAVENOUS
  Administered 2023-12-10: 160 ug via INTRAVENOUS
  Administered 2023-12-10 (×3): 80 ug via INTRAVENOUS

## 2023-12-10 MED ORDER — EZETIMIBE 10 MG PO TABS
10.0000 mg | ORAL_TABLET | Freq: Every day | ORAL | Status: DC
  Administered 2023-12-11: 10 mg via ORAL
  Filled 2023-12-10: qty 1

## 2023-12-10 MED ORDER — ACETAMINOPHEN 10 MG/ML IV SOLN
INTRAVENOUS | Status: DC | PRN
  Administered 2023-12-10: 1000 mg via INTRAVENOUS

## 2023-12-10 MED ORDER — FENTANYL CITRATE (PF) 250 MCG/5ML IJ SOLN
INTRAMUSCULAR | Status: DC | PRN
Start: 2023-12-10 — End: 2023-12-10
  Administered 2023-12-10 (×3): 50 ug via INTRAVENOUS

## 2023-12-10 MED ORDER — ENOXAPARIN SODIUM 40 MG/0.4ML IJ SOSY
40.0000 mg | PREFILLED_SYRINGE | INTRAMUSCULAR | Status: DC
  Administered 2023-12-11: 40 mg via SUBCUTANEOUS
  Filled 2023-12-10: qty 0.4

## 2023-12-10 MED ORDER — LACTATED RINGERS IV SOLN: INTRAVENOUS | Status: DC

## 2023-12-10 MED ORDER — ALBUTEROL SULFATE (2.5 MG/3ML) 0.083% IN NEBU: 2.5000 mg | INHALATION_SOLUTION | Freq: Four times a day (QID) | RESPIRATORY_TRACT | Status: DC | PRN

## 2023-12-10 MED ORDER — SIMVASTATIN 20 MG PO TABS
40.0000 mg | ORAL_TABLET | Freq: Every day | ORAL | Status: DC
  Administered 2023-12-10: 40 mg via ORAL
  Filled 2023-12-10: qty 2

## 2023-12-10 MED ORDER — LIDOCAINE 2% (20 MG/ML) 5 ML SYRINGE
INTRAMUSCULAR | Status: AC
  Filled 2023-12-10: qty 5

## 2023-12-10 MED ORDER — DOCUSATE SODIUM 100 MG PO CAPS
100.0000 mg | ORAL_CAPSULE | Freq: Two times a day (BID) | ORAL | Status: DC
  Administered 2023-12-10 – 2023-12-11 (×2): 100 mg via ORAL
  Filled 2023-12-10 (×2): qty 1

## 2023-12-10 MED ORDER — BUPIVACAINE HCL (PF) 0.5 % IJ SOLN
INTRAMUSCULAR | Status: AC
  Filled 2023-12-10: qty 30

## 2023-12-10 MED ORDER — DEXAMETHASONE SODIUM PHOSPHATE 10 MG/ML IJ SOLN
INTRAMUSCULAR | Status: AC
  Filled 2023-12-10: qty 1

## 2023-12-10 MED ORDER — HYDROCODONE-ACETAMINOPHEN 5-325 MG PO TABS
1.0000 | ORAL_TABLET | Freq: Four times a day (QID) | ORAL | Status: DC | PRN
  Administered 2023-12-10: 1 via ORAL
  Filled 2023-12-10: qty 1

## 2023-12-10 MED ORDER — ORAL CARE MOUTH RINSE: 15.0000 mL | Freq: Once | OROMUCOSAL | Status: AC

## 2023-12-10 MED ORDER — ONDANSETRON HCL 4 MG/2ML IJ SOLN
INTRAMUSCULAR | Status: AC
  Filled 2023-12-10: qty 2

## 2023-12-10 MED ORDER — LIDOCAINE-EPINEPHRINE 1 %-1:100000 IJ SOLN
INTRAMUSCULAR | Status: AC
  Filled 2023-12-10: qty 1

## 2023-12-10 MED ORDER — CARVEDILOL 6.25 MG PO TABS
6.2500 mg | ORAL_TABLET | Freq: Two times a day (BID) | ORAL | Status: DC
  Administered 2023-12-10 – 2023-12-11 (×2): 6.25 mg via ORAL
  Filled 2023-12-10 (×2): qty 1

## 2023-12-10 MED ORDER — CHLORHEXIDINE GLUCONATE CLOTH 2 % EX PADS: 6.0000 | MEDICATED_PAD | Freq: Once | CUTANEOUS | Status: DC

## 2023-12-10 MED ORDER — CHLORHEXIDINE GLUCONATE 4 % EX SOLN: 1.0000 | CUTANEOUS | 1 refills | Status: AC

## 2023-12-10 MED ORDER — PROPOFOL 10 MG/ML IV BOLUS
INTRAVENOUS | Status: AC
  Filled 2023-12-10: qty 20

## 2023-12-10 MED ORDER — LORATADINE 10 MG PO TABS
10.0000 mg | ORAL_TABLET | Freq: Every day | ORAL | Status: DC
  Administered 2023-12-11: 10 mg via ORAL
  Filled 2023-12-10: qty 1

## 2023-12-10 MED ORDER — OXYCODONE HCL 5 MG/5ML PO SOLN: 5.0000 mg | Freq: Once | ORAL | Status: DC | PRN

## 2023-12-10 MED ORDER — KETOROLAC TROMETHAMINE 15 MG/ML IJ SOLN
7.5000 mg | Freq: Four times a day (QID) | INTRAMUSCULAR | Status: AC
  Administered 2023-12-10 – 2023-12-11 (×4): 7.5 mg via INTRAVENOUS
  Filled 2023-12-10 (×4): qty 1

## 2023-12-10 MED ORDER — BUPIVACAINE HCL (PF) 0.5 % IJ SOLN
INTRAMUSCULAR | Status: DC | PRN
  Administered 2023-12-10: 20 mL
  Administered 2023-12-10: 5 mL

## 2023-12-10 MED ORDER — METHOCARBAMOL 500 MG PO TABS
500.0000 mg | ORAL_TABLET | Freq: Four times a day (QID) | ORAL | Status: DC | PRN
  Administered 2023-12-10 – 2023-12-11 (×4): 500 mg via ORAL
  Filled 2023-12-10 (×4): qty 1

## 2023-12-10 MED ORDER — HYDROMORPHONE HCL 1 MG/ML IJ SOLN: 0.5000 mg | INTRAMUSCULAR | Status: DC | PRN

## 2023-12-10 MED ORDER — FLEET ENEMA RE ENEM: 1.0000 | ENEMA | Freq: Once | RECTAL | Status: DC | PRN

## 2023-12-10 MED ORDER — SUGAMMADEX SODIUM 200 MG/2ML IV SOLN
INTRAVENOUS | Status: DC | PRN
  Administered 2023-12-10: 200 mg via INTRAVENOUS

## 2023-12-10 MED ORDER — MUPIROCIN 2 % EX OINT
1.0000 | TOPICAL_OINTMENT | Freq: Two times a day (BID) | CUTANEOUS | Status: DC
  Administered 2023-12-10: 1 via NASAL
  Filled 2023-12-10: qty 22

## 2023-12-10 MED ORDER — INSULIN ASPART 100 UNIT/ML IJ SOLN: 0.0000 [IU] | Freq: Three times a day (TID) | INTRAMUSCULAR | Status: DC

## 2023-12-10 MED ORDER — LIDOCAINE-EPINEPHRINE 1 %-1:100000 IJ SOLN
INTRAMUSCULAR | Status: DC | PRN
  Administered 2023-12-10: 5 mL

## 2023-12-10 MED ORDER — LIDOCAINE 2% (20 MG/ML) 5 ML SYRINGE
INTRAMUSCULAR | Status: DC | PRN
Start: 2023-12-10 — End: 2023-12-10
  Administered 2023-12-10: 60 mg via INTRAVENOUS

## 2023-12-10 MED ORDER — METHOCARBAMOL 1000 MG/10ML IJ SOLN: 500.0000 mg | Freq: Four times a day (QID) | INTRAMUSCULAR | Status: DC | PRN

## 2023-12-10 MED ORDER — INSULIN ASPART 100 UNIT/ML IJ SOLN
INTRAMUSCULAR | Status: AC
  Administered 2023-12-10: 2 [IU] via SUBCUTANEOUS
  Filled 2023-12-10: qty 1

## 2023-12-10 MED ORDER — ONDANSETRON HCL 4 MG/2ML IJ SOLN
INTRAMUSCULAR | Status: DC | PRN
Start: 2023-12-10 — End: 2023-12-10
  Administered 2023-12-10: 4 mg via INTRAVENOUS

## 2023-12-10 MED ORDER — CEFAZOLIN SODIUM-DEXTROSE 2-4 GM/100ML-% IV SOLN
2.0000 g | Freq: Three times a day (TID) | INTRAVENOUS | Status: AC
  Administered 2023-12-10 – 2023-12-11 (×2): 2 g via INTRAVENOUS
  Filled 2023-12-10 (×2): qty 100

## 2023-12-10 MED ORDER — SUCCINYLCHOLINE CHLORIDE 200 MG/10ML IV SOSY
PREFILLED_SYRINGE | INTRAVENOUS | Status: AC
Start: 2023-12-10 — End: ?
  Filled 2023-12-10: qty 10

## 2023-12-10 MED ORDER — PHENYLEPHRINE HCL-NACL 20-0.9 MG/250ML-% IV SOLN
INTRAVENOUS | Status: DC | PRN
  Administered 2023-12-10: 80 ug/min via INTRAVENOUS

## 2023-12-10 MED ORDER — ACETAMINOPHEN 650 MG RE SUPP: 650.0000 mg | RECTAL | Status: DC | PRN

## 2023-12-10 MED ORDER — LOSARTAN POTASSIUM 50 MG PO TABS
100.0000 mg | ORAL_TABLET | Freq: Every day | ORAL | Status: DC
  Administered 2023-12-11: 100 mg via ORAL
  Filled 2023-12-10: qty 2

## 2023-12-10 MED ORDER — INSULIN ASPART 100 UNIT/ML IJ SOLN
0.0000 [IU] | Freq: Every day | INTRAMUSCULAR | Status: DC
  Administered 2023-12-10: 4 [IU] via SUBCUTANEOUS

## 2023-12-10 MED ORDER — FENTANYL CITRATE (PF) 250 MCG/5ML IJ SOLN
INTRAMUSCULAR | Status: AC
  Filled 2023-12-10: qty 5

## 2023-12-10 MED ORDER — HYDROCODONE-ACETAMINOPHEN 5-325 MG PO TABS
1.0000 | ORAL_TABLET | Freq: Four times a day (QID) | ORAL | Status: DC | PRN
  Administered 2023-12-10 – 2023-12-11 (×3): 1 via ORAL
  Filled 2023-12-10: qty 2
  Filled 2023-12-10 (×2): qty 1

## 2023-12-10 MED ORDER — BISACODYL 10 MG RE SUPP
10.0000 mg | Freq: Every day | RECTAL | Status: DC | PRN
  Filled 2023-12-10: qty 1

## 2023-12-10 MED ORDER — OXYCODONE HCL 5 MG PO TABS: 5.0000 mg | ORAL_TABLET | Freq: Once | ORAL | Status: DC | PRN

## 2023-12-10 MED ORDER — SENNA 8.6 MG PO TABS
1.0000 | ORAL_TABLET | Freq: Two times a day (BID) | ORAL | Status: DC
  Administered 2023-12-10 – 2023-12-11 (×2): 8.6 mg via ORAL
  Filled 2023-12-10 (×2): qty 1

## 2023-12-10 MED ORDER — SODIUM CHLORIDE 0.9% FLUSH: 3.0000 mL | INTRAVENOUS | Status: DC | PRN

## 2023-12-10 MED ORDER — INSULIN ASPART 100 UNIT/ML IJ SOLN: 0.0000 [IU] | INTRAMUSCULAR | Status: DC | PRN

## 2023-12-10 MED ORDER — ONDANSETRON HCL 4 MG PO TABS: 4.0000 mg | ORAL_TABLET | Freq: Four times a day (QID) | ORAL | Status: DC | PRN

## 2023-12-10 MED ORDER — DEXAMETHASONE SODIUM PHOSPHATE 4 MG/ML IJ SOLN
INTRAMUSCULAR | Status: DC | PRN
  Administered 2023-12-10: 6 mg via INTRAVENOUS

## 2023-12-10 MED ORDER — POLYETHYLENE GLYCOL 3350 17 G PO PACK: 17.0000 g | PACK | Freq: Every day | ORAL | Status: DC | PRN

## 2023-12-10 MED ORDER — THROMBIN 5000 UNITS EX SOLR
OROMUCOSAL | Status: DC | PRN
  Administered 2023-12-10: 5 mL via TOPICAL

## 2023-12-10 MED ORDER — CEFAZOLIN SODIUM-DEXTROSE 2-4 GM/100ML-% IV SOLN
2.0000 g | INTRAVENOUS | Status: AC
  Administered 2023-12-10: 2 g via INTRAVENOUS
  Filled 2023-12-10: qty 100

## 2023-12-10 SURGICAL SUPPLY — 44 items
BAG COUNTER SPONGE SURGICOUNT (BAG) ×2 IMPLANT
BAND RUBBER #18 3X1/16 STRL (MISCELLANEOUS) ×4 IMPLANT
BLADE CLIPPER SURG (BLADE) IMPLANT
BUR ACORN 6.0 (BURR) IMPLANT
BUR MATCHSTICK NEURO 3.0 LAGG (BURR) ×2 IMPLANT
CANISTER SUCT 3000ML PPV (MISCELLANEOUS) ×2 IMPLANT
DERMABOND ADVANCED .7 DNX12 (GAUZE/BANDAGES/DRESSINGS) ×2 IMPLANT
DEVICE DISSECT PLASMABLAD 3.0S (MISCELLANEOUS) ×2 IMPLANT
DRAPE HALF SHEET 40X57 (DRAPES) IMPLANT
DRAPE LAPAROTOMY T 102X78X121 (DRAPES) ×2 IMPLANT
DRAPE MICROSCOPE SLANT 54X150 (MISCELLANEOUS) IMPLANT
DRSG OPSITE POSTOP 4X6 (GAUZE/BANDAGES/DRESSINGS) IMPLANT
DURAPREP 26ML APPLICATOR (WOUND CARE) ×2 IMPLANT
ELECT REM PT RETURN 9FT ADLT (ELECTROSURGICAL) ×1 IMPLANT
ELECTRODE REM PT RTRN 9FT ADLT (ELECTROSURGICAL) ×2 IMPLANT
GAUZE 4X4 16PLY ~~LOC~~+RFID DBL (SPONGE) IMPLANT
GAUZE SPONGE 4X4 12PLY STRL (GAUZE/BANDAGES/DRESSINGS) ×2 IMPLANT
GLOVE BIOGEL PI IND STRL 8.5 (GLOVE) ×2 IMPLANT
GLOVE ECLIPSE 8.5 STRL (GLOVE) ×2 IMPLANT
GOWN STRL REUS W/ TWL LRG LVL3 (GOWN DISPOSABLE) IMPLANT
GOWN STRL REUS W/ TWL XL LVL3 (GOWN DISPOSABLE) IMPLANT
GOWN STRL REUS W/TWL 2XL LVL3 (GOWN DISPOSABLE) ×2 IMPLANT
HEMOSTAT POWDER KIT SURGIFOAM (HEMOSTASIS) ×2 IMPLANT
KIT BASIN OR (CUSTOM PROCEDURE TRAY) ×2 IMPLANT
KIT TURNOVER KIT B (KITS) ×2 IMPLANT
NDL HYPO 22X1.5 SAFETY MO (MISCELLANEOUS) ×2 IMPLANT
NDL SPNL 20GX3.5 QUINCKE YW (NEEDLE) IMPLANT
NEEDLE HYPO 22X1.5 SAFETY MO (MISCELLANEOUS) ×1 IMPLANT
NEEDLE SPNL 20GX3.5 QUINCKE YW (NEEDLE) IMPLANT
NS IRRIG 1000ML POUR BTL (IV SOLUTION) ×2 IMPLANT
PACK LAMINECTOMY NEURO (CUSTOM PROCEDURE TRAY) ×2 IMPLANT
PAD ARMBOARD POSITIONER FOAM (MISCELLANEOUS) ×6 IMPLANT
PATTIES SURGICAL .5 X1 (DISPOSABLE) ×2 IMPLANT
PLASMABLADE 3.0S (MISCELLANEOUS) ×1 IMPLANT
SPIKE FLUID TRANSFER (MISCELLANEOUS) ×2 IMPLANT
SPONGE SURGIFOAM ABS GEL SZ50 (HEMOSTASIS) IMPLANT
SUT VIC AB 1 CT1 18XBRD ANBCTR (SUTURE) ×2 IMPLANT
SUT VIC AB 2-0 CP2 18 (SUTURE) ×2 IMPLANT
SUT VIC AB 3-0 SH 8-18 (SUTURE) ×2 IMPLANT
SUT VIC AB 4-0 RB1 18 (SUTURE) ×2 IMPLANT
TOWEL GREEN STERILE (TOWEL DISPOSABLE) ×2 IMPLANT
TOWEL GREEN STERILE FF (TOWEL DISPOSABLE) ×2 IMPLANT
TUBING FEATHERFLOW (TUBING) ×2 IMPLANT
WATER STERILE IRR 1000ML POUR (IV SOLUTION) ×2 IMPLANT

## 2023-12-10 NOTE — Transfer of Care (Signed)
 Immediate Anesthesia Transfer of Care Note  Patient: [name] [name]  Procedure(s) Performed: Microdiscectomy - Lumbar one-Lumbar two - bilateral (Bilateral: Spine Lumbar)  Patient Location: PACU  Anesthesia Type:General  Level of Consciousness: drowsy and patient cooperative  Airway & Oxygen Therapy: Patient Spontanous Breathing and Patient connected to face mask oxygen  Post-op Assessment: Report given to RN and Post -op Vital signs reviewed and stable  Post vital signs: Reviewed and stable  Last Vitals:  Vitals Value Taken Time  BP 144/69 12/10/23 1120  Temp    Pulse 69 12/10/23 1122  Resp 20 12/10/23 1122  SpO2 97 % 12/10/23 1122  Vitals shown include unfiled device data.  Last Pain:  Vitals:   12/10/23 0726  TempSrc:   PainSc: 5       Patients Stated Pain Goal: 3 (12/10/23 0726)  Complications: No notable events documented.

## 2023-12-10 NOTE — Anesthesia Procedure Notes (Addendum)
 Procedure Name: Intubation Date/Time: 12/10/2023 9:12 AM  Performed by: Achille Rich, MDPre-anesthesia Checklist: Patient identified, Emergency Drugs available, Suction available, Patient being monitored and Timeout performed Patient Re-evaluated:Patient Re-evaluated prior to induction Oxygen Delivery Method: Circle system utilized Preoxygenation: Pre-oxygenation with 100% oxygen Induction Type: IV induction Ventilation: Mask ventilation without difficulty Laryngoscope Size: Mac and 3 Grade View: Grade I Tube type: Oral Tube size: 7.5 mm Number of attempts: 1 Airway Equipment and Method: Stylet Placement Confirmation: ETT inserted through vocal cords under direct vision, positive ETCO2 and breath sounds checked- equal and bilateral Secured at: 22 cm Tube secured with: Tape Dental Injury: Teeth and Oropharynx as per pre-operative assessment

## 2023-12-10 NOTE — Op Note (Signed)
 Date of surgery: 12/10/2023 Preoperative diagnosis: Herniated nucleus pulposus L1-L2 with cauda equina syndrome Postoperative diagnosis: Same Procedure: Bilateral laminotomies and discectomy L1-L2 decompression of the L1 and L2 nerve roots. Surgeon: Barnett Abu First Assistant: Lisbeth Renshaw MD Anesthesia: General endotracheal Indications: Jeral Zick is a 78 year old individual who previously undergone an anterolateral decompression with an XLIF procedure at L1-L2.  During his recovery he noted that he was having more weakness and pain in his legs and he was noted on the MRI that there was now a large central disc herniation posteriorly at the L1-L2 level.  He has been advised regarding surgical decompression.  Procedure the patient was brought to the operating room supine on the stretcher.  After the smooth induction of general tracheal anesthesia he was carefully turned prone.  Bony prominences were appropriately padded and protected.  The back was prepped with alcohol DuraPrep and draped in a sterile fashion.  Midline incision was created and carried down to the lumbodorsal fascia localizing radiograph identified the L1-L2 interspace positively and then a subperiosteal dissection was carried out at L1-L2 bilaterally.  Self-retaining retractor was placed and the wound.  Then from the right side I created a laminotomy removing the inferior margin lamina of L1 out to and including the medial facetectomy at the L1-L2 level.  Yellow ligament was taken up and was noted to be very thickened and redundant.  The dura was exposed and then carefully dissected down to the lateral aspect across the level of the space at L1-L2.  The superior portion of the laminar arch of L2 was removed to facilitate the dissection.  Then by dissecting laterally we controlled hemostasis carefully with the bipolar cautery and by reflecting the dura medially a large mass was encountered.  This was easily incised and the  substantial quantities of severely degenerated desiccated disc material was encountered.  This was removed in a piecemeal fashion as the dissection increased more freedom of the common dural tube and the L2 nerve root was obtained the dissection was carried superiorly across the disc space to release the lateral aspect and the foramen at L1.  Once this was accomplished from the right side laminotomy was created on the left side and similar procedure was carried out identifying more disc serial.  This was done combination with Dr. Conchita Paris who provided retraction and exposure while I worked in the disc base and on the opposite side the procedure was reversed.  Once the dissection was completed hemostasis was achieved with bipolar cautery and some small pledgets of Surgifoam which was irrigated away.  The area was inspected and no remnants of disc could be obtained the disc base was entered and some small quantities of disc material removed from within it at this point was felt that the decompression was good and solid and the retractor was then removed 20 cc of half percent Marcaine was injected into the paraspinous fascia and then the lumbodorsal fascia was closed with #1 Vicryl in interrupted fashion 2-0 Vicryl in the subcutaneous tissues 3-0 and 4-0 Vicryl and subcuticular skin Dermabond was placed on the skin.  Blood loss was estimated at 75 cc.

## 2023-12-10 NOTE — H&P (Signed)
 [name] [name] is an 78 y.o. male.   Chief Complaint: Back and bilateral leg pain HPI: The patient is a 78 year old individual who has had advanced degenerative changes at the L1-L2 level patient has had an anterolateral decompression arthrodesis using an XLIF technique a couple of months ago.  Though he has some initial improvement he soon developed recurrent back and leg pain and ultimately an MRI performed a few weeks ago demonstrates that he has now herniated the residual disc at the L1-L2 level with a large posterior fragment causing canal compromise and cord compression.  Patient is not taken to the operating room to undergo bilateral laminotomy and discectomy.  This is at the L1-L2 level.  Past Medical History:  Diagnosis Date   Anemia    Arthritis    Asthma    with exertion   Back pain    CAD (coronary artery disease)    a. s/p STEMI in 2004 with stenting to LAD and LCx b. low-risk NST in 2015   Cataracts, bilateral    immature   Colitis    just finished one antibiotic and Cipro will be finished up   COPD (chronic obstructive pulmonary disease) (HCC)    no meds required   Diverticulosis    GERD (gastroesophageal reflux disease)    takes Omeprazole daily   Hemorrhoids    History of bronchitis    many yrs ago   History of colon polyps    Hyperlipidemia    takes Simvastatin nightly   Hypertension    takes Losartan and Metoprolol daily   Joint pain    Myocardial infarct (HCC) 09/17/2002   Paget disease of bone    takes Fosamax daily   Shortness of breath    Vertigo    takes Meclizine daily as needed    Past Surgical History:  Procedure Laterality Date   ABDOMINAL EXPLORATION SURGERY     ANTERIOR LAT LUMBAR FUSION N/A 08/26/2023   Procedure: Extreme Lateral INTERBODY FUSION LUMBAR ONE-TWO;  Surgeon: Barnett Abu, MD;  [facility];  Service: Neurosurgery;  Laterality: N/A;  C3   APPENDECTOMY     BACK SURGERY     x 2   bilateral knee arthroscopies     x 3    CARPAL TUNNEL RELEASE Right    COLONOSCOPY     COLONOSCOPY WITH PROPOFOL N/A 01/23/2022   Procedure: COLONOSCOPY WITH PROPOFOL;  Surgeon: Dolores Frame, MD;  [facility];  Service: Gastroenterology;  Laterality: N/A;  10:10 ASA 2   CORONARY ANGIOPLASTY     3 stents   DENTAL SURGERY     ESOPHAGOGASTRODUODENOSCOPY     HERNIA REPAIR     inguinal   JOINT REPLACEMENT Left    LEFT HEART CATHETERIZATION WITH CORONARY ANGIOGRAM N/A 08/27/2011   Procedure: LEFT HEART CATHETERIZATION WITH CORONARY ANGIOGRAM;  Surgeon: Herby Abraham, MD;  Location: [facility] CATH LAB;  Service: Cardiovascular;  Laterality: N/A;   LUMBAR LAMINECTOMY WITH COFLEX 2 LEVEL N/A 01/03/2015   Procedure: Lumbar three-four,Lumbar four-five Laminectomy with Coflex;  Surgeon: Barnett Abu, MD;  [facility] NEURO [facility]S;  Service: Neurosurgery;  Laterality: N/A;   POLYPECTOMY  01/23/2022   Procedure: POLYPECTOMY;  Surgeon: Dolores Frame, MD;  [facility];  Service: Gastroenterology;;   TOTAL KNEE ARTHROPLASTY Right 11/16/2013   DR Turner Daniels   TOTAL KNEE ARTHROPLASTY Right 11/16/2013   Procedure: TOTAL KNEE ARTHROPLASTY;  Surgeon: Nestor Lewandowsky, MD;  Location: [facility] OR;  Service: Orthopedics;  Laterality: Right;   TOTAL KNEE ARTHROPLASTY Left 01/12/02    Family History  Problem Relation Age of Onset   Heart attack Mother    Diabetes Mother    Emphysema Father    Atrial fibrillation Sister    Heart attack Brother    Heart attack Brother    Heart disease Brother    Breast cancer Sister    Social History:  reports that he quit smoking about 25 years ago. His smoking use included cigarettes. He started smoking about 65 years ago. He has never used smokeless tobacco. He reports that he does not currently use alcohol. He reports that he does not use drugs.  Allergies:  Allergies  Allergen Reactions   Codeine Nausea And Vomiting    Medications Prior to Admission  Medication Sig Dispense Refill    albuterol (PROVENTIL HFA;VENTOLIN HFA) 108 (90 Base) MCG/ACT inhaler Inhale 2 puffs into the lungs every 6 (six) hours as needed for wheezing or shortness of breath.     albuterol (PROVENTIL) (2.5 MG/3ML) 0.083% nebulizer solution Take 2.5 mg by nebulization every 6 (six) hours as needed for wheezing or shortness of breath.     aspirin EC 325 MG tablet Take 325 mg by mouth daily with lunch.     benzonatate (TESSALON) 100 MG capsule Take 1 capsule (100 mg total) by mouth every 8 (eight) hours. (Patient taking differently: Take 100 mg by mouth 3 (three) times daily as needed for cough.) 21 capsule 0   carvedilol (COREG) 6.25 MG tablet Take 6.25 mg by mouth 2 (two) times daily.     cetirizine (ZYRTEC) 10 MG tablet Take 10 mg by mouth daily.     ezetimibe (ZETIA) 10 MG tablet Take 1 tablet (10 mg total) by mouth daily. 90 tablet 3   HYDROcodone-acetaminophen (NORCO/VICODIN) 5-325 MG tablet Take 1 tablet by mouth every 6 (six) hours as needed for moderate pain (pain score 4-6).     losartan (COZAAR) 100 MG tablet TAKE 1 TABLET BY MOUTH DAILY 90 tablet 2   Multiple Vitamin (MULTIVITAMIN) capsule Take 1 capsule by mouth daily in the afternoon.     nitroGLYCERIN (NITROSTAT) 0.4 MG SL tablet Place 1 tablet (0.4 mg total) under the tongue every 5 (five) minutes as needed. Chest pain 25 tablet 3   omeprazole (PRILOSEC) 40 MG capsule Take 40 mg by mouth daily.     simvastatin (ZOCOR) 40 MG tablet TAKE 1 TABLET BY MOUTH DAILY AT BEDTIME 90 tablet 1    Results for orders placed or performed during the hospital encounter of 12/10/23 (from the past 48 hours)  Glucose, capillary     Status: Abnormal   Collection Time: 12/10/23  7:37 AM  Result Value Ref Range   Glucose-Capillary 200 (H) 70 - 99 mg/dL    Comment: Glucose reference range applies only to samples taken after fasting for at least 8 hours.   Comment 1 Notify RN    No results found.  Review of Systems  Constitutional:  Positive for activity  change.  Musculoskeletal:  Positive for back pain, gait problem and myalgias.  Neurological:  Positive for weakness and numbness.  All other systems reviewed and are negative.   Blood pressure 136/71, pulse 68, temperature 97.8 F (36.6 C), temperature source Tympanic, resp. rate 18, height 5\' 10"  (1.778 m), weight 96.2 kg, SpO2 94%. Physical Exam Constitutional:      Appearance: Normal appearance. He is obese.  HENT:     Head: Normocephalic and atraumatic.  Right Ear: Tympanic membrane, ear canal and external ear normal.     Left Ear: Tympanic membrane, ear canal and external ear normal.     Nose: Nose normal.     Mouth/Throat:     Mouth: Mucous membranes are moist.     Pharynx: Oropharynx is clear.  Eyes:     Extraocular Movements: Extraocular movements intact.     Conjunctiva/sclera: Conjunctivae normal.     Pupils: Pupils are equal, round, and reactive to light.  Cardiovascular:     Rate and Rhythm: Normal rate and regular rhythm.     Pulses: Normal pulses.     Heart sounds: Normal heart sounds.  Pulmonary:     Effort: Pulmonary effort is normal.     Breath sounds: Normal breath sounds.  Abdominal:     General: Abdomen is flat. Bowel sounds are normal.     Palpations: Abdomen is soft.  Musculoskeletal:     Comments: Positive straight leg raising at 15 degrees in either lower extremity Patrick's maneuver is negative.  Skin:    General: Skin is warm and dry.     Capillary Refill: Capillary refill takes less than 2 seconds.  Neurological:     Mental Status: He is alert.     Comments: Cranial nerve examination is normal.  Upper extremity strength is normal decompressed reflexes in the biceps and triceps absent reflexes in the patella and the Achilles are also noted marked weakness in the iliopsoas quad tibialis anterior and gastrocs noted 3+ out of 5.  Psychiatric:        Mood and Affect: Mood normal.        Behavior: Behavior normal.        Thought Content: Thought  content normal.        Judgment: Judgment normal.      Assessment/Plan Herniated nucleus pulposus L1-L2 with cauda equina syndrome  Plan: Bilateral laminotomies and decompression of L1-L2.  Stefani Dama, MD 12/10/2023, 8:52 AM

## 2023-12-11 ENCOUNTER — Encounter (HOSPITAL_COMMUNITY): Payer: Self-pay | Admitting: Neurological Surgery

## 2023-12-11 ENCOUNTER — Other Ambulatory Visit: Payer: Self-pay

## 2023-12-11 DIAGNOSIS — Z79899 Other long term (current) drug therapy: Secondary | ICD-10-CM | POA: Diagnosis not present

## 2023-12-11 DIAGNOSIS — I2510 Atherosclerotic heart disease of native coronary artery without angina pectoris: Secondary | ICD-10-CM | POA: Diagnosis not present

## 2023-12-11 DIAGNOSIS — J449 Chronic obstructive pulmonary disease, unspecified: Secondary | ICD-10-CM | POA: Diagnosis not present

## 2023-12-11 DIAGNOSIS — Z7982 Long term (current) use of aspirin: Secondary | ICD-10-CM | POA: Diagnosis not present

## 2023-12-11 DIAGNOSIS — I10 Essential (primary) hypertension: Secondary | ICD-10-CM | POA: Diagnosis not present

## 2023-12-11 DIAGNOSIS — Z96653 Presence of artificial knee joint, bilateral: Secondary | ICD-10-CM | POA: Diagnosis not present

## 2023-12-11 DIAGNOSIS — G834 Cauda equina syndrome: Secondary | ICD-10-CM | POA: Diagnosis not present

## 2023-12-11 DIAGNOSIS — M5186 Other intervertebral disc disorders, lumbar region: Secondary | ICD-10-CM | POA: Diagnosis not present

## 2023-12-11 LAB — GLUCOSE, CAPILLARY: Glucose-Capillary: 211 mg/dL — ABNORMAL HIGH (ref 70–99)

## 2023-12-11 MED ORDER — METHOCARBAMOL 500 MG PO TABS: 500.0000 mg | ORAL_TABLET | Freq: Four times a day (QID) | ORAL | 3 refills | Status: AC | PRN

## 2023-12-11 MED ORDER — HYDROCODONE-ACETAMINOPHEN 5-325 MG PO TABS: 1.0000 | ORAL_TABLET | Freq: Four times a day (QID) | ORAL | 0 refills | Status: AC | PRN

## 2023-12-11 NOTE — Evaluation (Signed)
 Physical Therapy Evaluation  Patient Details Name: [name] [name] MRN: [id number] DOB: Jan 15, 1946 Today's Date: 12/11/2023  History of Present Illness  Pt is a 78 y/o male who presents s/p bilateral laminotomies and discectomy L1-L2 for decompression of the L1 and L2 nerve roots on 12/10/2023. PMH significant for XLIF L1-L2 08/2023, asthma, CAD, COPD, HTN, MI, Paget disease of bone, vertigo (on Meclizine), B TKA.   Clinical Impression  Patient evaluated by Physical Therapy with no further acute PT needs identified. All education has been completed and the patient has no further questions. Pt was able to demonstrate transfers and ambulation with gross modified independence and no AD. Pt was educated on precautions, positioning recommendations, appropriate activity progression, and car transfer. See below for any follow-up Physical Therapy or equipment needs. PT is signing off. Thank you for this referral.         If plan is discharge home, recommend the following: Assistance with cooking/housework;Assist for transportation;Help with stairs or ramp for entrance   Can travel by private vehicle        Equipment Recommendations None recommended by PT  Recommendations for Other Services       Functional Status Assessment Patient has had a recent decline in their functional status and demonstrates the ability to make significant improvements in function in a reasonable and predictable amount of time.     Precautions / Restrictions Precautions Precautions: Back Precaution Booklet Issued: Yes (comment) Recall of Precautions/Restrictions: Intact Required Braces or Orthoses:  (no brace need) Restrictions Weight Bearing Restrictions Per Provider Order: No      Mobility  Bed Mobility Overal bed mobility: Modified Independent Bed Mobility: Rolling, Sidelying to Sit           General bed mobility comments: VC's for log roll technique. HOB flat and rails lowered to simulate home  environment.    Transfers Overall transfer level: Modified independent Equipment used: None               General transfer comment: Pt demonstrated good posture with power up to full stand.    Ambulation/Gait Ambulation/Gait assistance: Modified independent (Device/Increase time) Gait Distance (Feet): 300 Feet Assistive device: None Gait Pattern/deviations: Step-through pattern, Decreased stride length Gait velocity: Decreased Gait velocity interpretation: >2.62 ft/sec, indicative of community ambulatory   General Gait Details: Pt with good posture throughout and no unsteadiness or LOB noted.  Stairs Stairs: Yes Stairs assistance: Supervision Stair Management: One rail Left, Alternating pattern, Forwards Number of Stairs: 10 General stair comments: VC's for general safety.  Wheelchair Mobility     Tilt Bed    Modified Rankin (Stroke Patients Only)       Balance Overall balance assessment: Mild deficits observed, not formally tested                                           Pertinent Vitals/Pain Pain Assessment Pain Assessment: Faces Faces Pain Scale: Hurts little more Pain Location: incisional Pain Descriptors / Indicators: Sore, Operative site guarding Pain Intervention(s): Limited activity within patient's tolerance, Monitored during session, Repositioned    Home Living Family/patient expects to be discharged to:: Private residence Living Arrangements: Spouse/significant other Available Help at Discharge: Family Type of Home: House Home Access: Stairs to enter Entrance Stairs-Rails: Right Entrance Stairs-Number of Steps: 4   Home Layout: Able to live on main level with bedroom/bathroom Home Equipment: Shower seat -  built in;[employer] - single point;BSC/3in1 Additional Comments: built in seat is a Environmental education officer    Prior Function Prior Level of Function : Independent/Modified Independent;Needs assist              Mobility Comments: Independent with mobility ADLs Comments: A for some LB ADLs     Extremity/Trunk Assessment   Upper Extremity Assessment Upper Extremity Assessment: Overall WFL for tasks assessed    Lower Extremity Assessment Lower Extremity Assessment: Generalized weakness (Mild)    Cervical / Trunk Assessment Cervical / Trunk Assessment: Back Surgery  Communication   Communication Communication: No apparent difficulties    Cognition Arousal: Alert Behavior During Therapy: WFL for tasks assessed/performed   PT - Cognitive impairments: No apparent impairments                         Following commands: Intact       Cueing Cueing Techniques: Verbal cues     General Comments      Exercises     Assessment/Plan    PT Assessment Patient does not need any further PT services  PT Problem List         PT Treatment Interventions      PT Goals (Current goals can be found in the Care Plan section)  Acute Rehab PT Goals Patient Stated Goal: Home today PT Goal Formulation: All assessment and education complete, DC therapy    Frequency       Co-evaluation               AM-PAC PT "6 Clicks" Mobility  Outcome Measure Help needed turning from your back to your side while in a flat bed without using bedrails?: None Help needed moving from lying on your back to sitting on the side of a flat bed without using bedrails?: None Help needed moving to and from a bed to a chair (including a wheelchair)?: None Help needed standing up from a chair using your arms (e.g., wheelchair or bedside chair)?: None Help needed to walk in hospital room?: None Help needed climbing 3-5 steps with a railing? : A Little 6 Click Score: 23    End of Session Equipment Utilized During Treatment: Gait belt Activity Tolerance: Patient tolerated treatment well Patient left: in bed;with call bell/phone within reach;with family/visitor present Nurse Communication: Mobility  status PT Visit Diagnosis: Unsteadiness on feet (R26.81);Pain Pain - part of body:  (back)    Time: 1610-9604 PT Time Calculation (min) (ACUTE ONLY): 20 min   Charges:   PT Evaluation $PT Eval Low Complexity: 1 Low   PT General Charges $$ ACUTE PT VISIT: 1 Visit         Conni Slipper, PT, DPT Acute Rehabilitation Services Secure Chat Preferred Office: [phone]   Marylynn Pearson 12/11/2023, 11:26 AM

## 2023-12-11 NOTE — Progress Notes (Signed)
 Discharge information given to patient by RN.  RN educated pt and answered all questions asked.  Pt and wife have no further questions at this time.  All pt belongings were returned to pt and pt wheeled down to wife's car.

## 2023-12-11 NOTE — Progress Notes (Signed)
 Patient ID: [name] [name], male   DOB: [birth year], 78 y.o.   MRN: [id number] Vital signs are stable blood sugars have steadily decreased overnight now 211.  Patient to be seen by diabetic coordinator today.  He will need follow-up for his diabetes.  Discussed the importance of this with the patient.  Also discussed wound care.  He can shower does not need a brace needs to sit straight walk straight stand straight minders posture.  I will see him in 2 weeks for follow-up.

## 2023-12-11 NOTE — Anesthesia Postprocedure Evaluation (Signed)
 Anesthesia Post Note  Patient: [name] [name]  Procedure(s) Performed: Microdiscectomy - Lumbar one-Lumbar two - bilateral (Bilateral: Spine Lumbar)     Patient location during evaluation: PACU Anesthesia Type: General Level of consciousness: awake and alert Pain management: pain level controlled Vital Signs Assessment: post-procedure vital signs reviewed and stable Respiratory status: spontaneous breathing, nonlabored ventilation, respiratory function stable and patient connected to nasal cannula oxygen Cardiovascular status: blood pressure returned to baseline and stable Postop Assessment: no apparent nausea or vomiting Anesthetic complications: no   No notable events documented.  Last Vitals:  Vitals:   12/10/23 2317 12/11/23 0352  BP: 117/62 (!) 121/59  Pulse: 74 70  Resp: 18 18  Temp: 36.9 C 36.7 C  SpO2: 93% 96%    Last Pain:  Vitals:   12/11/23 0407  TempSrc:   PainSc: 6                  Karell Tukes S

## 2023-12-11 NOTE — Evaluation (Signed)
 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 PMHx: L1-2 fusion 12/24, anemia, CAD, colitis, COPD, CAD, HTN, MI, paget disease of bone, URI, vertigo, bil TKA.     Clinical Impressions This 78 yo male admitted and underwent above presents to acute OT with all education completed with pt and wife as well as post op back handout reviewed. Acute OT will sign off.     If plan is discharge home, recommend the following:   A little help with bathing/dressing/bathroom;Assistance with cooking/housework;Assist for transportation;Help with stairs or ramp for entrance     Functional Status Assessment   Patient has had a recent decline in their functional status and demonstrates the ability to make significant improvements in function in a reasonable and predictable amount of time. (without further need for skilled OT)     Equipment Recommendations   None recommended by OT      Precautions/Restrictions   Precautions Precautions: Back Precaution Booklet Issued: Yes (comment) Recall of Precautions/Restrictions: Intact Required Braces or Orthoses:  (no brace need) Restrictions Weight Bearing Restrictions Per Provider Order: No     Mobility Bed Mobility Overal bed mobility: Needs Assistance Bed Mobility: Rolling, Sidelying to Sit, Sit to Sidelying Rolling: Supervision Sidelying to sit: Supervision     Sit to sidelying: Supervision General bed mobility comments: VCs for proper technique in and OOB    Transfers Overall transfer level: Modified independent                 General transfer  comment: educated on stance and looking up for sit<>stand      Balance Overall balance assessment: Mild deficits observed, not formally tested                                         ADL either performed or assessed with clinical judgement   ADL Overall ADL's : Needs assistance/impaired Eating/Feeding: Independent;Sitting     Grooming Details (indicate cue type and reason): Educated on use of 2 cups for brushing teeth Upper Body Bathing: Set up;Sitting   Lower Body Bathing: Moderate assistance Lower Body Bathing Details (indicate cue type and reason): Mod I sit<>stand Upper Body Dressing : Set up;Sitting   Lower Body Dressing: Moderate assistance Lower Body Dressing Details (indicate cue type and reason): Mod I sit<>stand Toilet Transfer: Modified Independent;Comfort height toilet;Grab bars Toilet Transfer Details (indicate cue type and reason): Pt has sink he can use at home to help sit<>stand prn Toileting- Clothing Manipulation and Hygiene: Modified independent;Sit to/from stand Toileting - Clothing Manipulation Details (indicate cue type and reason): educated on use of wet wipes   Tub/Shower Transfer Details (indicate cue type and reason): Educated to stand under water and turn to get rinsed off, not twisting/bending around to get rinsed off         Vision Baseline Vision/History: 1 Wears glasses Ability to See in Adequate Light: 0 Adequate Patient Visual Report: No change from baseline              Pertinent Vitals/Pain Pain Assessment Pain Assessment:  0-10 Pain Score: 4  Pain Location: incisional Pain Descriptors / Indicators: Aching, Sore Pain Intervention(s): Limited activity within patient's tolerance, Monitored during session, Premedicated before session     Extremity/Trunk Assessment Upper Extremity Assessment Upper Extremity Assessment: Overall WFL for tasks assessed           Communication Communication Communication: No  apparent difficulties   Cognition Arousal: Alert Behavior During Therapy: WFL for tasks assessed/performed Cognition: No apparent impairments                               Following commands: Intact       Cueing    Cueing Techniques: Verbal cues              Home Living Family/patient expects to be discharged to:: Private residence Living Arrangements: Spouse/significant other Available Help at Discharge: Family Type of Home: House Home Access: Stairs to enter Secretary/administrator of Steps: 4 Entrance Stairs-Rails: Right Home Layout: Able to live on main level with bedroom/bathroom     Bathroom Shower/Tub: Producer, television/film/video: Handicapped height     Home Equipment: Shower seat - built in;Standard Theme park manager - single point;BSC/3in1   Additional Comments: built in seat is a Environmental education officer      Prior Functioning/Environment Prior Level of Function : Independent/Modified Independent;Needs assist             Mobility Comments: Independent with mobility ADLs Comments: A for some LB ADLs    OT Problem List: Decreased range of motion;Impaired balance (sitting and/or standing);Pain        OT Goals(Current goals can be found in the care plan section)   Acute Rehab OT Goals Patient Stated Goal: to go home todasy         AM-PAC OT "6 Clicks" Daily Activity     Outcome Measure Help from another person eating meals?: None Help from another person taking care of personal grooming?: A Little Help from another person toileting, which includes using toliet, bedpan, or urinal?: A Little Help from another person bathing (including washing, rinsing, drying)?: A Lot Help from another person to put on and taking off regular upper body clothing?: A Little Help from another person to put on and taking off regular lower body clothing?: A Lot 6 Click Score: 17   End of Session Nurse Communication:  (no further OT needs)  Activity Tolerance:  Patient tolerated treatment well Patient left: with family/visitor present (sitting on EOB)  OT Visit Diagnosis: Other abnormalities of gait and mobility (R26.89);Pain Pain - part of body:  (incisional)                Time: 1610-9604 OT Time Calculation (min): 25 min Charges:  OT General Charges $OT Visit: 1 Visit OT Evaluation $OT Eval Moderate Complexity: 1 Mod OT Treatments $Self Care/Home Management : 8-22 mins  Lindon Romp OT Acute Rehabilitation Services Office [phone]    Evette Georges 12/11/2023, 8:10 AM

## 2023-12-11 NOTE — Discharge Instructions (Signed)
 Wound Care Leave incision open to air. You may shower. Do not scrub directly on incision.  Do not put any creams, lotions, or ointments on incision. Activity Walk each and every day, increasing distance each day. No lifting greater than 8 lbs.  Avoid bending, arching, and twisting. No driving for 2 weeks; may ride as a passenger locally.  Diet Resume your normal diet.   Call Your Doctor If Any of These Occur Redness, drainage, or swelling at the wound.  Temperature greater than 101 degrees. Severe pain not relieved by pain medication. Incision starts to come apart. Follow Up Appt Call today for appointment in 3 weeks ([phone]) or for problems.  If you have any hardware placed in your spine, you will need an x-ray before your appointment.

## 2023-12-11 NOTE — Inpatient Diabetes Management (Signed)
 Inpatient Diabetes Program Recommendations  AACE/ADA: New Consensus Statement on Inpatient Glycemic Control (2015)  Target Ranges:  Prepandial:   less than 140 mg/dL      Peak postprandial:   less than 180 mg/dL (1-2 hours)      Critically ill patients:  140 - 180 mg/dL   Lab Results  Component Value Date   GLUCAP 211 (H) 12/11/2023   HGBA1C 9.1 (H) 12/10/2023    Diabetes history: No prior hx Current orders for Inpatient glycemic control: Novolog 0-20 units tid, 0-5 units hs  Decadron 6 mg 933 am on 12/10/23  Inpatient Diabetes Program Recommendations:   Spoke with pt and spouse about new diagnosis. Discussed A1C results with them and explained what an A1C is, basic pathophysiology of DM Type 2, basic home care, basic diabetes diet nutrition principles, importance of checking CBGs and maintaining good CBG control to prevent long-term and short-term complications. Reviewed signs and symptoms of hyperglycemia and hypoglycemia and how to treat hypoglycemia at home. Also reviewed blood sugar goals at home.  RNs to provide ongoing basic DM education at bedside with this patient. Patient received Living Well With Diabetes and wife and patient have started reviewing.  MD ordered application of Freestyle CGM at discharge for patient. Education done regarding application and changing CGM sensor (alternate every 15 days on back of arms), 1 hour warm-up, use of glucometer when alert displays, how to scan CGM for glucose reading and information for PCP. Patient has also been given educational packet regarding use CGM sensor including the 1-800 toll free number for any questions, problems or needs related to the [facility] sensors or reader.    Sensor applied by patient to (R) Arm at 1000 am.  Explained that glucose readings will not be available until 1 hour after application. Reviewed use of CGM including how to scan, changing Sensor, Vitamin C warning, arrows with glucose readings, and Freestyle  app. Patient very appreciative.   Discussed basic plate method and nutrition information. Patients wife to call PCP office and request prescription for glucose meter and supplies. Placed order recs for glucose meter and supplies (#[id number]).  Thank you, Billy Fischer. Shaquana Buel, RN, MSN, CDCES  Diabetes Coordinator Inpatient Glycemic Control Team Team Pager [phone] (8am-5pm) 12/11/2023 10:31 AM

## 2023-12-11 NOTE — Discharge Summary (Signed)
 Physician Discharge Summary  Patient ID: [name] [name] MRN: [id number] DOB/AGE: 80-Jul-1947 80 y.o.  Admit date: 12/10/2023 Discharge date: 12/11/2023  Admission Diagnoses: Herniated nucleus pulposus L1-L2 with cauda equina syndrome  Discharge Diagnoses: Herniated nucleus pulposus L1-L2.  Cauda equina syndrome. Principal Problem:   Herniated nucleus pulposus, L1-2   Discharged Condition: good  Hospital Course: Patient tolerated surgery well  Consults:  Diabetic coordinator  Significant Diagnostic Studies: None  Treatments: surgery: See op note  Discharge Exam: Blood pressure 132/76, pulse 67, temperature 98 F (36.7 C), temperature source Oral, resp. rate 18, height 5\' 10"  (1.778 m), weight 96.2 kg, SpO2 95%. Incision is clean dry Station and gait are improved patient still has a slight forward stooped posture of 10 degrees when he ambulates.  Disposition: Discharge disposition: 01-Home or Self Care       Discharge Instructions     Call MD for:  redness, tenderness, or signs of infection (pain, swelling, redness, odor or green/yellow discharge around incision site)   Complete by: As directed    Call MD for:  severe uncontrolled pain   Complete by: As directed    Call MD for:  temperature >100.4   Complete by: As directed    Diet - low sodium heart healthy   Complete by: As directed    Incentive spirometry RT   Complete by: As directed    Increase activity slowly   Complete by: As directed       Allergies as of 12/11/2023       Reactions   Codeine Nausea And Vomiting        Medication List     TAKE these medications    albuterol 108 (90 Base) MCG/ACT inhaler Commonly known as: VENTOLIN HFA Inhale 2 puffs into the lungs every 6 (six) hours as needed for wheezing or shortness of breath.   albuterol (2.5 MG/3ML) 0.083% nebulizer solution Commonly known as: PROVENTIL Take 2.5 mg by nebulization every 6 (six) hours as needed for wheezing or shortness  of breath.   aspirin EC 325 MG tablet Take 325 mg by mouth daily with lunch.   benzonatate 100 MG capsule Commonly known as: TESSALON Take 1 capsule (100 mg total) by mouth every 8 (eight) hours. What changed:  when to take this reasons to take this   carvedilol 6.25 MG tablet Commonly known as: COREG Take 6.25 mg by mouth 2 (two) times daily.   cetirizine 10 MG tablet Commonly known as: ZYRTEC Take 10 mg by mouth daily.   chlorhexidine 4 % external liquid Commonly known as: HIBICLENS Apply 15 mLs (1 Application total) topically as directed for 30 doses. Use as directed daily for 5 days every other week for 6 weeks.   ezetimibe 10 MG tablet Commonly known as: ZETIA Take 1 tablet (10 mg total) by mouth daily.   HYDROcodone-acetaminophen 5-325 MG tablet Commonly known as: NORCO/VICODIN Take 1-2 tablets by mouth every 6 (six) hours as needed for moderate pain (pain score 4-6) or severe pain (pain score 7-10). What changed:  how much to take reasons to take this   losartan 100 MG tablet Commonly known as: COZAAR TAKE 1 TABLET BY MOUTH DAILY   methocarbamol 500 MG tablet Commonly known as: ROBAXIN Take 1 tablet (500 mg total) by mouth every 6 (six) hours as needed for muscle spasms.   multivitamin capsule Take 1 capsule by mouth daily in the afternoon.   mupirocin ointment 2 % Commonly known as: BACTROBAN Place 1 Application  into the nose 2 (two) times daily for 60 doses. Use as directed 2 times daily for 5 days every other week for 6 weeks.   nitroGLYCERIN 0.4 MG SL tablet Commonly known as: NITROSTAT Place 1 tablet (0.4 mg total) under the tongue every 5 (five) minutes as needed. Chest pain   omeprazole 40 MG capsule Commonly known as: PRILOSEC Take 40 mg by mouth daily.   simvastatin 40 MG tablet Commonly known as: ZOCOR TAKE 1 TABLET BY MOUTH DAILY AT BEDTIME         Signed: Stefani Dama 12/11/2023, 8:57 AM

## 2023-12-11 NOTE — Care Management Obs Status (Signed)
 MEDICARE OBSERVATION STATUS NOTIFICATION   Patient Details  Name: DAYNA GEURTS MRN: [id number] Date of Birth: April 22, 1946   Medicare Observation Status Notification Given:  Yes    Lawerance Sabal, RN 12/11/2023, 10:20 AM

## 2023-12-12 DIAGNOSIS — E119 Type 2 diabetes mellitus without complications: Secondary | ICD-10-CM | POA: Diagnosis not present

## 2024-01-28 DIAGNOSIS — E119 Type 2 diabetes mellitus without complications: Secondary | ICD-10-CM | POA: Diagnosis not present

## 2024-02-13 DIAGNOSIS — D485 Neoplasm of uncertain behavior of skin: Secondary | ICD-10-CM | POA: Diagnosis not present

## 2024-02-13 DIAGNOSIS — C44321 Squamous cell carcinoma of skin of nose: Secondary | ICD-10-CM | POA: Diagnosis not present

## 2024-02-13 DIAGNOSIS — D0430 Carcinoma in situ of skin of unspecified part of face: Secondary | ICD-10-CM | POA: Diagnosis not present

## 2024-02-13 DIAGNOSIS — C44329 Squamous cell carcinoma of skin of other parts of face: Secondary | ICD-10-CM | POA: Diagnosis not present

## 2024-02-26 DIAGNOSIS — M48062 Spinal stenosis, lumbar region with neurogenic claudication: Secondary | ICD-10-CM | POA: Diagnosis not present

## 2024-02-27 DIAGNOSIS — C44329 Squamous cell carcinoma of skin of other parts of face: Secondary | ICD-10-CM | POA: Diagnosis not present

## 2024-03-12 DIAGNOSIS — C44329 Squamous cell carcinoma of skin of other parts of face: Secondary | ICD-10-CM | POA: Diagnosis not present

## 2024-03-13 ENCOUNTER — Encounter: Payer: Self-pay | Admitting: Emergency Medicine

## 2024-03-13 ENCOUNTER — Ambulatory Visit
Admission: EM | Admit: 2024-03-13 | Discharge: 2024-03-13 | Disposition: A | Discharge status: Home / Self Care | Attending: Family Medicine | Admitting: Family Medicine

## 2024-03-13 DIAGNOSIS — R112 Nausea with vomiting, unspecified: Secondary | ICD-10-CM | POA: Diagnosis not present

## 2024-03-13 DIAGNOSIS — R42 Dizziness and giddiness: Secondary | ICD-10-CM | POA: Diagnosis not present

## 2024-03-13 DIAGNOSIS — H6123 Impacted cerumen, bilateral: Secondary | ICD-10-CM

## 2024-03-13 LAB — POCT FASTING CBG KUC MANUAL ENTRY: POCT Glucose (KUC): 162 mg/dL — AB (ref 70–99)

## 2024-03-13 MED ORDER — ONDANSETRON 4 MG PO TBDP
4.0000 mg | ORAL_TABLET | Freq: Three times a day (TID) | ORAL | 0 refills | Status: AC | PRN
Start: 2024-03-13 — End: ?

## 2024-03-13 MED ORDER — MECLIZINE HCL 25 MG PO TABS: 25.0000 mg | ORAL_TABLET | Freq: Three times a day (TID) | ORAL | 0 refills | Status: AC | PRN

## 2024-03-13 MED ORDER — ONDANSETRON 4 MG PO TBDP
4.0000 mg | ORAL_TABLET | Freq: Once | ORAL | Status: AC
  Administered 2024-03-13: 4 mg via ORAL

## 2024-03-13 NOTE — Discharge Instructions (Signed)
 I have prescribed meclizine  and Zofran , nausea medication to help with your vertigo symptoms and given you a handout on how to do the Epley maneuvers at home.  Follow-up with your primary care provider for a recheck and go to the emergency department if your symptoms significantly worsen.  Use your Debrox wax softening drops about twice a day to the left ear until you are able to clear the rest of the wax impaction.  The right ear canal is clear of wax after rinsing them out today in office.

## 2024-03-13 NOTE — ED Triage Notes (Signed)
 Dizziness started 3 days ago.  Feels nauseated.  Wife reports patient has fallen down to knees 3 times today.  C/o headache that he woke up with.  Wife gave patient a meclizine  this morning that was 80 years old.  States he threw up right after.   States dizziness is worse with movement.

## 2024-03-15 NOTE — ED Provider Notes (Signed)
 [facility]    CSN: [id number] Arrival date & time: 03/13/24  1316      History   Chief Complaint No chief complaint on file.   HPI [name] [name] is a 78 y.o. male.   Patient presenting today with 3-day history of severe room spinning dizziness, nausea worse with movement of his head.  States he has stumbled 3 times today due to the dizziness.  Felt a bit better yesterday but has been significantly worse since waking this morning.  Tried a meclizine  that was expired from 3 years ago when he had a vertigo flare but could not keep the medication down and threw up directly after.  Denies chest pain, shortness of breath, palpitations, diaphoresis.  History of vertigo that has felt very similar.    Past Medical History:  Diagnosis Date   Anemia    Arthritis    Asthma    with exertion   Back pain    CAD (coronary artery disease)    a. s/p STEMI in 2004 with stenting to LAD and LCx b. low-risk NST in 2015   Cataracts, bilateral    immature   Colitis    just finished one antibiotic and Cipro will be finished up   COPD (chronic obstructive pulmonary disease) (HCC)    no meds required   Diverticulosis    GERD (gastroesophageal reflux disease)    takes Omeprazole  daily   Hemorrhoids    History of bronchitis    many yrs ago   History of colon polyps    Hyperlipidemia    takes Simvastatin  nightly   Hypertension    takes Losartan  and Metoprolol  daily   Joint pain    Myocardial infarct (HCC) 09/17/2002   Paget disease of bone    takes Fosamax daily   Shortness of breath    Vertigo    takes Meclizine  daily as needed    Patient Active Problem List   Diagnosis Date Noted   Herniated nucleus pulposus, L1-2 12/10/2023   Spinal stenosis of lumbar region with neurogenic claudication 08/26/2023   Lumbar spinal stenosis 01/03/2015   Arthritis of right knee 11/16/2013   Hyperlipidemia with target low density lipoprotein (LDL) cholesterol less than 100 mg/dL  88/79/7986   Essential hypertension 08/06/2012   Atypical chest pain 08/29/2011   CAD, NATIVE VESSEL 07/10/2010   GERD 05/09/2009   ARTHRITIS 05/09/2009    Past Surgical History:  Procedure Laterality Date   ABDOMINAL EXPLORATION SURGERY     ANTERIOR LAT LUMBAR FUSION N/A 08/26/2023   Procedure: Extreme Lateral INTERBODY FUSION LUMBAR ONE-TWO;  Surgeon: Colon Shove, MD;  Location: [facility] OR;  Service: Neurosurgery;  Laterality: N/A;  C3   APPENDECTOMY     BACK SURGERY     x 2   bilateral knee arthroscopies     x 3   CARPAL TUNNEL RELEASE Right    COLONOSCOPY     COLONOSCOPY WITH PROPOFOL  N/A 01/23/2022   Procedure: COLONOSCOPY WITH PROPOFOL ;  Surgeon: Eartha Angelia Sieving, MD;  Location: [facility];  Service: Gastroenterology;  Laterality: N/A;  10:10 ASA 2   CORONARY ANGIOPLASTY     3 stents   DENTAL SURGERY     ESOPHAGOGASTRODUODENOSCOPY     FORAMINOTOMY 1 LEVEL Bilateral 12/10/2023   Procedure: Microdiscectomy - Lumbar one-Lumbar two - bilateral;  Surgeon: Colon Shove, MD;  Location: [facility] OR;  Service: Neurosurgery;  Laterality: Bilateral;   HERNIA REPAIR     inguinal   JOINT REPLACEMENT Left  LEFT HEART CATHETERIZATION WITH CORONARY ANGIOGRAM N/A 08/27/2011   Procedure: LEFT HEART CATHETERIZATION WITH CORONARY ANGIOGRAM;  Surgeon: Debby JONETTA Como, MD;  Location: [facility] CATH LAB;  Service: Cardiovascular;  Laterality: N/A;   LUMBAR LAMINECTOMY WITH COFLEX 2 LEVEL N/A 01/03/2015   Procedure: Lumbar three-four,Lumbar four-five Laminectomy with Coflex;  Surgeon: Victory Gens, MD;  Location: [facility];  Service: Neurosurgery;  Laterality: N/A;   POLYPECTOMY  01/23/2022   Procedure: POLYPECTOMY;  Surgeon: Eartha Angelia Sieving, MD;  Location: [facility];  Service: Gastroenterology;;   TOTAL KNEE ARTHROPLASTY Right 11/16/2013   DR LIAM   TOTAL KNEE ARTHROPLASTY Right 11/16/2013   Procedure: TOTAL KNEE ARTHROPLASTY;  Surgeon: Dempsey JINNY LIAM, MD;  Location: [facility] OR;  Service:  Orthopedics;  Laterality: Right;   TOTAL KNEE ARTHROPLASTY Left 01/12/02       Home Medications    Prior to Admission medications   Medication Sig Start Date End Date Taking? Authorizing Provider  meclizine  (ANTIVERT ) 25 MG tablet Take 1 tablet (25 mg total) by mouth 3 (three) times daily as needed for dizziness. May cause drowsiness 03/13/24  Yes Stuart Vernell Norris, PA-C  ondansetron  (ZOFRAN -ODT) 4 MG disintegrating tablet Take 1 tablet (4 mg total) by mouth every 8 (eight) hours as needed for nausea or vomiting. 03/13/24  Yes Stuart Vernell Norris, PA-C  albuterol  (PROVENTIL  HFA;VENTOLIN  HFA) 108 (90 Base) MCG/ACT inhaler Inhale 2 puffs into the lungs every 6 (six) hours as needed for wheezing or shortness of breath.    [provider]  albuterol  (PROVENTIL ) (2.5 MG/3ML) 0.083% nebulizer solution Take 2.5 mg by nebulization every 6 (six) hours as needed for wheezing or shortness of breath.    [provider]  aspirin  EC 325 MG tablet Take 325 mg by mouth daily with lunch.    [provider]  benzonatate  (TESSALON ) 100 MG capsule Take 1 capsule (100 mg total) by mouth every 8 (eight) hours. Patient taking differently: Take 100 mg by mouth 3 (three) times daily as needed for cough. 07/13/23   Enedelia Dorna HERO, FNP  carvedilol  (COREG ) 6.25 MG tablet Take 6.25 mg by mouth 2 (two) times daily.    [provider]  cetirizine (ZYRTEC) 10 MG tablet Take 10 mg by mouth daily.    [provider]  chlorhexidine  (HIBICLENS ) 4 % external liquid Apply 15 mLs (1 Application total) topically as directed for 30 doses. Use as directed daily for 5 days every other week for 6 weeks. 12/10/23   Gens Victory, MD  ezetimibe  (ZETIA ) 10 MG tablet Take 1 tablet (10 mg total) by mouth daily. 11/11/23   Alvan Dorn FALCON, MD  HYDROcodone -acetaminophen  (NORCO/VICODIN) 5-325 MG tablet Take 1-2 tablets by mouth every 6 (six) hours as needed for moderate pain (pain score  4-6) or severe pain (pain score 7-10). 12/11/23   Gens Victory, MD  losartan  (COZAAR ) 100 MG tablet TAKE 1 TABLET BY MOUTH DAILY 08/07/23   Alvan Dorn FALCON, MD  methocarbamol  (ROBAXIN ) 500 MG tablet Take 1 tablet (500 mg total) by mouth every 6 (six) hours as needed for muscle spasms. 12/11/23   Gens Victory, MD  Multiple Vitamin (MULTIVITAMIN) capsule Take 1 capsule by mouth daily in the afternoon.    [provider]  nitroGLYCERIN  (NITROSTAT ) 0.4 MG SL tablet Place 1 tablet (0.4 mg total) under the tongue every 5 (five) minutes as needed. Chest pain 07/23/19   Alvan Dorn FALCON, MD  omeprazole  (PRILOSEC) 40 MG capsule Take 40 mg by  mouth daily.    [provider]  simvastatin  (ZOCOR ) 40 MG tablet TAKE 1 TABLET BY MOUTH DAILY AT BEDTIME 10/28/23   Branch, Dorn FALCON, MD    Family History Family History  Problem Relation Age of Onset   Heart attack Mother    Diabetes Mother    Emphysema Father    Atrial fibrillation Sister    Heart attack Brother    Heart attack Brother    Heart disease Brother    Breast cancer Sister     Social History Social History   Tobacco Use   Smoking status: Former    Current packs/day: 0.00    Types: Cigarettes    Start date: 01/01/1958    Quit date: 09/17/1998    Years since quitting: 25.5   Smokeless tobacco: Never   Tobacco comments:    quit smoking in 2001  Vaping Use   Vaping status: Never Used  Substance Use Topics   Alcohol use: Not Currently    Comment: occasionally wine   Drug use: No     Allergies   Codeine   Review of Systems Review of Systems Per HPI  Physical Exam Triage Vital Signs ED Triage Vitals  Encounter Vitals Group     BP 03/13/24 1325 137/76     Girls Systolic BP Percentile --      Girls Diastolic BP Percentile --      Boys Systolic BP Percentile --      Boys Diastolic BP Percentile --      Pulse Rate 03/13/24 1325 67     Resp 03/13/24 1325 20     Temp 03/13/24 1325 (!) 96.7 F (35.9 C)      Temp Source 03/13/24 1325 Temporal     SpO2 03/13/24 1325 92 %     Weight --      Height --      Head Circumference --      Peak Flow --      Pain Score 03/13/24 1326 5     Pain Loc --      Pain Education --      Exclude from Growth Chart --    No data found.  Updated Vital Signs BP 137/76 (BP Location: Left Arm)   Pulse 67   Temp (!) 96.7 F (35.9 C) (Temporal)   Resp 20   SpO2 92%   Visual Acuity Right Eye Distance:   Left Eye Distance:   Bilateral Distance:    Right Eye Near:   Left Eye Near:    Bilateral Near:     Physical Exam Vitals and nursing note reviewed.  Constitutional:      Appearance: Normal appearance.  HENT:     Head: Atraumatic.     Right Ear: Tympanic membrane normal. There is impacted cerumen.     Left Ear: Tympanic membrane normal. There is impacted cerumen.     Mouth/Throat:     Mouth: Mucous membranes are moist.   Eyes:     Extraocular Movements: Extraocular movements intact.     Conjunctiva/sclera: Conjunctivae normal.     Pupils: Pupils are equal, round, and reactive to light.    Cardiovascular:     Rate and Rhythm: Normal rate and regular rhythm.  Pulmonary:     Effort: Pulmonary effort is normal.     Breath sounds: Normal breath sounds.   Musculoskeletal:     Cervical back: Normal range of motion and neck supple.     Comments: Currently in  wheelchair due to dizziness, ambulatory without assistance at baseline   Skin:    General: Skin is warm and dry.   Neurological:     General: No focal deficit present.     Mental Status: He is oriented to person, place, and time.     Cranial Nerves: No cranial nerve deficit.     Motor: No weakness.     Gait: Gait normal.   Psychiatric:        Mood and Affect: Mood normal.        Thought Content: Thought content normal.        Judgment: Judgment normal.      UC Treatments / Results  Labs (all labs ordered are listed, but only abnormal results are displayed) Labs Reviewed  POCT  FASTING CBG [facility] MANUAL ENTRY - Abnormal; Notable for the following components:      Result Value   POCT Glucose ([facility]) 162 (*)    All other components within normal limits    EKG   Radiology No results found.  Procedures Procedures (including critical care time)  Medications Ordered in UC Medications  ondansetron  (ZOFRAN -ODT) disintegrating tablet 4 mg (4 mg Oral Given 03/13/24 1423)    Initial Impression / Assessment and Plan / UC Course  I have reviewed the triage vital signs and the nursing notes.  Pertinent labs & imaging results that were available during my care of the patient were reviewed by me and considered in my medical decision making (see chart for details).     Exam very reassuring today, vitals within normal limits.  No focal neurologic deficits or other concerning features but did have bilateral cerumen impaction so lavage was performed today with clearance of impactions.  TMs benign and procedure well-tolerated.  Point-of-care random glucose 162, declines EKG or blood work today.  Suspect vertigo, Zofran  given for active nausea and will refill meclizine  as prior prescription is expired, give Zofran  for as needed use and handout given for Epley maneuvers.  Close PCP follow-up first thing next week recommended, ED for worsening symptoms at any time.  Final Clinical Impressions(s) / UC Diagnoses   Final diagnoses:  Vertigo  Nausea and vomiting, unspecified vomiting type  Bilateral impacted cerumen     Discharge Instructions      I have prescribed meclizine  and Zofran , nausea medication to help with your vertigo symptoms and given you a handout on how to do the Epley maneuvers at home.  Follow-up with your primary care provider for a recheck and go to the emergency department if your symptoms significantly worsen.  Use your Debrox wax softening drops about twice a day to the left ear until you are able to clear the rest of the wax impaction.  The right ear canal is  clear of wax after rinsing them out today in office.    ED Prescriptions     Medication Sig Dispense Auth. Provider   ondansetron  (ZOFRAN -ODT) 4 MG disintegrating tablet Take 1 tablet (4 mg total) by mouth every 8 (eight) hours as needed for nausea or vomiting. 20 tablet Stuart Vernell Norris, PA-C   meclizine  (ANTIVERT ) 25 MG tablet Take 1 tablet (25 mg total) by mouth 3 (three) times daily as needed for dizziness. May cause drowsiness 30 tablet Stuart Vernell Norris, [place]      PDMP not reviewed this encounter.   Stuart Vernell Norris, [place] 03/15/24 1520

## 2024-03-17 DIAGNOSIS — E119 Type 2 diabetes mellitus without complications: Secondary | ICD-10-CM | POA: Diagnosis not present

## 2024-03-17 DIAGNOSIS — E785 Hyperlipidemia, unspecified: Secondary | ICD-10-CM | POA: Diagnosis not present

## 2024-03-17 DIAGNOSIS — I10 Essential (primary) hypertension: Secondary | ICD-10-CM | POA: Diagnosis not present

## 2024-03-19 DIAGNOSIS — H6692 Otitis media, unspecified, left ear: Secondary | ICD-10-CM | POA: Diagnosis not present

## 2024-03-19 DIAGNOSIS — R42 Dizziness and giddiness: Secondary | ICD-10-CM | POA: Diagnosis not present

## 2024-04-27 ENCOUNTER — Other Ambulatory Visit: Payer: Self-pay | Admitting: Cardiology

## 2024-05-07 DIAGNOSIS — E1165 Type 2 diabetes mellitus with hyperglycemia: Secondary | ICD-10-CM | POA: Diagnosis not present

## 2024-06-02 DIAGNOSIS — Z23 Encounter for immunization: Secondary | ICD-10-CM | POA: Diagnosis not present

## 2024-06-02 DIAGNOSIS — L989 Disorder of the skin and subcutaneous tissue, unspecified: Secondary | ICD-10-CM | POA: Diagnosis not present

## 2024-06-03 DIAGNOSIS — L309 Dermatitis, unspecified: Secondary | ICD-10-CM | POA: Diagnosis not present

## 2024-06-09 DIAGNOSIS — L570 Actinic keratosis: Secondary | ICD-10-CM | POA: Diagnosis not present

## 2024-06-11 DIAGNOSIS — E1165 Type 2 diabetes mellitus with hyperglycemia: Secondary | ICD-10-CM | POA: Diagnosis not present

## 2024-07-07 ENCOUNTER — Encounter: Payer: Self-pay | Admitting: Cardiology

## 2024-07-07 ENCOUNTER — Ambulatory Visit: Attending: Cardiology | Admitting: Cardiology

## 2024-07-07 VITALS — BP 150/82 | HR 66 | Ht 70.0 in | Wt 206.2 lb

## 2024-07-07 DIAGNOSIS — I10 Essential (primary) hypertension: Secondary | ICD-10-CM | POA: Diagnosis not present

## 2024-07-07 DIAGNOSIS — I2510 Atherosclerotic heart disease of native coronary artery without angina pectoris: Secondary | ICD-10-CM

## 2024-07-07 DIAGNOSIS — E782 Mixed hyperlipidemia: Secondary | ICD-10-CM

## 2024-07-07 NOTE — Patient Instructions (Addendum)
 Medication Instructions:  Continue all current medications.   Labwork: none  Testing/Procedures: none  Follow-Up: 6 months   Any Other Special Instructions Will Be Listed Below (If Applicable).   If you need a refill on your cardiac medications before your next appointment, please call your pharmacy.

## 2024-07-07 NOTE — Progress Notes (Signed)
 Clinical Summary [name] [name] is a 78 y.o.male seen today for follow up of the following medical problems.      1. CAD   - prior STEMI in 2004, received stent to LAD and LCX   - 08/2011 Echo LVEF 50-55%   - Jan 2015 Lexiscan  MPI no ischemia    [facility] 2022 echo: LVEF 65-70%, grade I dd -12/2021 visit with PA Strader reported some recent DOE - 01/2022 nuclear stress: inferior infarct, no current ischemia.   - no chest pains, chronic SOB/DOE overall unchanged - compliant with meds       2. Hyperlipidemia - followed by pcp  -Reports tried on more potent statins but caused muscle aches. Tolerating simva 40mg  daily well. At last visit we added zetia  10mg  daily.   03/2024 TC 120 HDL 41 TG 99 LDL 65   3. HTN - compliant with meds - checks home bp's reports are at goal     4. COPD/Tobacco history - abnormal PFTs 06/2017, managed by pcp - 07/2018 no AAA   5. Subdural hematoma - 07/2020 SDH after fall    6. 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  He was on daily aspirin , Plavix  was added and statin was continued.  - neurology stopped plavix  and started high dose aspirin      7. Dizzienss with standing - typically with getting  - caffeine free soda and coffee. 2 bottles of water per day, one 16 oz soda caffeine free, 1.5 cups of coffee in AM.  8. Carotid bruits - CTA 2022 plaque but no significant stenosis  Past Medical History:  Diagnosis Date   Anemia    Arthritis    Asthma    with exertion   Back pain    CAD (coronary artery disease)    a. s/p STEMI in 2004 with stenting to LAD and LCx b. low-risk NST in 2015   Cataracts, bilateral    immature   Colitis    just finished one antibiotic and Cipro will be finished up   COPD (chronic obstructive pulmonary disease) (HCC)    no meds required   Diverticulosis    GERD (gastroesophageal reflux disease)    takes  Omeprazole  daily   Hemorrhoids    History of bronchitis    many yrs ago   History of colon polyps    Hyperlipidemia    takes Simvastatin  nightly   Hypertension    takes Losartan  and Metoprolol  daily   Joint pain    Myocardial infarct (HCC) 09/17/2002   Paget disease of bone    takes Fosamax daily   Shortness of breath    Vertigo    takes Meclizine  daily as needed     Allergies  Allergen Reactions   Codeine Nausea And Vomiting     Current Outpatient Medications  Medication Sig Dispense Refill   albuterol  (PROVENTIL  HFA;VENTOLIN  HFA) 108 (90 Base) MCG/ACT inhaler Inhale 2 puffs into the lungs every 6 (six) hours as needed for wheezing or shortness of breath.     albuterol  (PROVENTIL ) (2.5 MG/3ML) 0.083% nebulizer solution Take 2.5 mg by nebulization every 6 (six) hours as needed for wheezing or shortness of breath.     aspirin  EC 325 MG tablet Take 325 mg by mouth daily with lunch.     benzonatate  (TESSALON ) 100 MG capsule Take 1 capsule (100 mg total)  by mouth every 8 (eight) hours. (Patient taking differently: Take 100 mg by mouth 3 (three) times daily as needed for cough.) 21 capsule 0   carvedilol  (COREG ) 6.25 MG tablet Take 6.25 mg by mouth 2 (two) times daily.     cetirizine (ZYRTEC) 10 MG tablet Take 10 mg by mouth daily.     chlorhexidine  (HIBICLENS ) 4 % external liquid Apply 15 mLs (1 Application total) topically as directed for 30 doses. Use as directed daily for 5 days every other week for 6 weeks. 946 mL 1   ezetimibe  (ZETIA ) 10 MG tablet Take 1 tablet (10 mg total) by mouth daily. 90 tablet 3   HYDROcodone -acetaminophen  (NORCO/VICODIN) 5-325 MG tablet Take 1-2 tablets by mouth every 6 (six) hours as needed for moderate pain (pain score 4-6) or severe pain (pain score 7-10). 50 tablet 0   losartan  (COZAAR ) 100 MG tablet TAKE 1 TABLET BY MOUTH DAILY 90 tablet 2   meclizine  (ANTIVERT ) 25 MG tablet Take 1 tablet (25 mg total) by mouth 3 (three) times daily as needed for  dizziness. May cause drowsiness 30 tablet 0   methocarbamol  (ROBAXIN ) 500 MG tablet Take 1 tablet (500 mg total) by mouth every 6 (six) hours as needed for muscle spasms. 30 tablet 3   Multiple Vitamin (MULTIVITAMIN) capsule Take 1 capsule by mouth daily in the afternoon.     nitroGLYCERIN  (NITROSTAT ) 0.4 MG SL tablet Place 1 tablet (0.4 mg total) under the tongue every 5 (five) minutes as needed. Chest pain 25 tablet 3   omeprazole  (PRILOSEC) 40 MG capsule Take 40 mg by mouth daily.     ondansetron  (ZOFRAN -ODT) 4 MG disintegrating tablet Take 1 tablet (4 mg total) by mouth every 8 (eight) hours as needed for nausea or vomiting. 20 tablet 0   simvastatin  (ZOCOR ) 40 MG tablet TAKE 1 TABLET BY MOUTH DAILY AT BEDTIME 90 tablet 2   No current facility-administered medications for this visit.     Past Surgical History:  Procedure Laterality Date   ABDOMINAL EXPLORATION SURGERY     ANTERIOR LAT LUMBAR FUSION N/A 08/26/2023   Procedure: Extreme Lateral INTERBODY FUSION LUMBAR ONE-TWO;  Surgeon: Colon Shove, MD;  Location: [facility] OR;  Service: Neurosurgery;  Laterality: N/A;  C3   APPENDECTOMY     BACK SURGERY     x 2   bilateral knee arthroscopies     x 3   CARPAL TUNNEL RELEASE Right    COLONOSCOPY     COLONOSCOPY WITH PROPOFOL  N/A 01/23/2022   Procedure: COLONOSCOPY WITH PROPOFOL ;  Surgeon: Eartha Angelia Sieving, MD;  Location: [facility];  Service: Gastroenterology;  Laterality: N/A;  10:10 ASA 2   CORONARY ANGIOPLASTY     3 stents   DENTAL SURGERY     ESOPHAGOGASTRODUODENOSCOPY     FORAMINOTOMY 1 LEVEL Bilateral 12/10/2023   Procedure: Microdiscectomy - Lumbar one-Lumbar two - bilateral;  Surgeon: Colon Shove, MD;  Location: [facility] OR;  Service: Neurosurgery;  Laterality: Bilateral;   HERNIA REPAIR     inguinal   JOINT REPLACEMENT Left    LEFT HEART CATHETERIZATION WITH CORONARY ANGIOGRAM N/A 08/27/2011   Procedure: LEFT HEART CATHETERIZATION WITH CORONARY ANGIOGRAM;  Surgeon: Debby JONETTA Como, MD;  Location: [facility] CATH LAB;  Service: Cardiovascular;  Laterality: N/A;   LUMBAR LAMINECTOMY WITH COFLEX 2 LEVEL N/A 01/03/2015   Procedure: Lumbar three-four,Lumbar four-five Laminectomy with Coflex;  Surgeon: Shove Colon, MD;  Location: [facility] NEURO ORS;  Service: Neurosurgery;  Laterality: N/A;  POLYPECTOMY  01/23/2022   Procedure: POLYPECTOMY;  Surgeon: Eartha Angelia Sieving, MD;  Location: [facility];  Service: Gastroenterology;;   TOTAL KNEE ARTHROPLASTY Right 11/16/2013   DR LIAM   TOTAL KNEE ARTHROPLASTY Right 11/16/2013   Procedure: TOTAL KNEE ARTHROPLASTY;  Surgeon: Dempsey JINNY LIAM, MD;  Location: [facility] OR;  Service: Orthopedics;  Laterality: Right;   TOTAL KNEE ARTHROPLASTY Left 01/12/02     Allergies  Allergen Reactions   Codeine Nausea And Vomiting      Family History  Problem Relation Age of Onset   Heart attack Mother    Diabetes Mother    Emphysema Father    Atrial fibrillation Sister    Heart attack Brother    Heart attack Brother    Heart disease Brother    Breast cancer Sister      Social History Mr. Wilford reports that he quit smoking about 25 years ago. His smoking use included cigarettes. He started smoking about 66 years ago. He has never used smokeless tobacco. Mr. Danis reports that he does not currently use alcohol.    Physical Examination Today's Vitals   07/07/24 1026  BP: (!) 150/82  Pulse: 66  SpO2: 95%  Weight: 206 lb 3.2 oz (93.5 kg)  Height: 5' 10 (1.778 m)   Body mass index is 29.59 kg/m.  Gen: resting comfortably, no acute distress HEENT: no scleral icterus, pupils equal round and reactive, no palptable cervical adenopathy,  CV: RRR, no m/rg, no jvd Resp: Clear to auscultation bilaterally GI: abdomen is soft, non-tender, non-distended, normal bowel sounds, no hepatosplenomegaly MSK: extremities are warm, no edema.  Skin: warm, no rash Neuro:  no focal deficits Psych: appropriate affect   Diagnostic  Studies 08/2003 Cath   FINDINGS:   1. Left main trunk: Large caliber vessel with mild irregularities.   2. LAD: This begins as a large caliber vessel and tapers in the distal   section. Provides two diagonal branches. The proximal LAD has mild   disease of 30%. The mid LAD has eccentric plaque of 50%. The distal LAD   has a high grade narrowing of 95% medially after the second diagonal   Laurabeth Yip with TIMI 2 flow distally. The first diagonal Kyliah Deanda has an   ostial narrowing of 50-60%. The second diagonal Edilia Ghuman has an ostial   narrowing of 30-40% and is a large caliber vessel.   3. Left circumflex artery: This is a medium caliber vessel. Provides   trivial first marginal Ardath Lepak in the mid section and two marginal   branches distally. The proximal AV circumflex has high grade narrowing   of 70-80%. There is then further narrowing of 50-60% after the first   marginal Everlee Quakenbush. The first marginal Karlisa Gaubert has long tubular narrowing of   50% in the proximal segment. The second marginal Leamon Palau has an ostial   narrowing of 80%. The third marginal Glendell Fouse has proximal narrowing of   70%.   4. Right coronary artery: Dominant. This is a large caliber vessel.   Provides the posterior descending artery and posterior ventricular Neilah Fulwider   in terminal segment. The right coronary artery has diffuse disease of 30-   40% in the mid and distal section.   5. LV: Normal end-systolic and end-diastolic dimensions. Overall left   ventricular function is well preserved. Ejection fraction greater than   55%. No mitral regurgitation. There is akinesis of a small portion of   the mid anterior wall. LV pressure is 110/10. Aortic is 110/60.  LVEDP   equals 15.   DESCRIPTION OF PROCEDURE: With these findings we elected to proceed with   percutaneous intervention to the distal LAD. The patient was given heparin    systemically as well as Reopro to maintain an ACT of approximately 300   seconds using 600 mg of Plavix . During  the case he developed some itching   and was given Benadryl  and Solu-Medrol  with improvement of this. A 6-French   Q4 guide catheter was used to engage the left coronary artery and a 0.014   inch Asahi Prowater wire introduced. This was used to cross the stenosis   and position the distal LAD. A 2.5 x 15 mm Voyager balloon was introduced   and used to dilate the distal LAD lesion at 6 atmospheres for 120 seconds.   Repeat angiography showed an improvement in vessel lumen. However, there   was severe residual plaque and it was felt that further intervention would   be required. We introduced a 2.75 x 15 mm cutting balloon and a total of   six inflations were performed within the distal LAD at up to 10 atmospheres   for 60 seconds. Repeat angiography showed modest improvement in vessel   lumen. However, in the LAO cranial views there still appeared to be   residual narrowing of 70-80% at the origin of the stenosis. Some plaque   shifting had occurred into the diagonal Bobbette Eakes as well. While we tried to   avoid stent placement, this became unavoidable. We positioned a 2.5 x 18 mm   Cypher stent into the distal LAD and deployed this at 10 atmospheres for 30   seconds. A 2.75 x 15 mm Quantum Maverick balloon was then used to post   dilate the stent. Two inflations were performed up to 14 atmospheres for 30   seconds each. Repeat angiography was then performed after the   administration of intracoronary nitroglycerin  showing excellent result with   no residual stenosis in the distal LAD and improvement of TIMI grade 2 to   TIMI grade 3 flow in the distal LAD. There was some plaque shift to the   origin of the second diagonal Gautham Hewins of 70-80%. However, there was still   TIMI 3 flow in this vessel. We decided to accept this result. Final   angiography was performed in various projections showing no distal vessel   damage. The guide catheter was then removed. An AngioSeal closure device   was then  deployed to the right femoral artery due to mild oozing around the   sheath. There was persistence of oozing and a FemStop was positioned until   hemostasis was achieved. The patient then transferred to the CCU in stable   condition. He tolerated the procedure well.   FINAL RESULTS: Successful PTCA and stent placement in the distal LAD with   reduction of 95% narrowing to 0% with placement of a 2.5 x 18 mm Cypher drug-   eluting stent dilated to 2.75 mm and improvement of TIMI grade 2 to TIMI   grade 3 flow.     08/2011 Echo   LVEF 50-55%, grade I diastolic dysfunction, difficult study.     09/04/13 EKG   Normal sinus rhythm, no ischemic changes     Jan 2015 Stress MPI IMPRESSION: Low risk Lexiscan  Cardiolite . No diagnostic ST segment changes were noted. Rare PACs observed. Perfusion imaging is consistent with diaphragmatic attenuation affecting the inferior wall, no clear evidence of scar or ischemia. LV volumes are  normal and LVEF is 62% without wall motion abnormality.   09/25/14 Clinic EKG SR with PACs     06/2017 PFTs Mild ventilatory defect, +air trapping, moderately reduced DLCO     10/2020 Echo [facility] Summary   1. Technically difficult study.    2. The left ventricle is normal in size with mildly increased wall  thickness.   3. The left ventricular systolic function is normal, LVEF is visually  estimated at 65-70%.    4. There is grade I diastolic dysfunction (impaired relaxation).    5. The aortic valve is trileaflet with mildly thickened leaflets with normal  excursion.   6. The left atrium is mildly dilated in size.    7. The right ventricle is upper normal in size, with normal systolic  function.     Assessment and Plan   1. CAD   -chronic SOB/DOE I think more related to COPD and recently being sedentary due to back surgery - most recent echo and stress test were benign. If progression of symptoms could consider repeating - no chest pains - continue  current meds     2 . Hyperlipidemia - reports muscle aches on more potent statins, has tolerated simvastatin  40mg . We added zetia  10mg  daily - follow LDL for now, ideal LDL would be <55. If remains above goal can discuss if he is willing to retry alternative statin or if he would be open to a [id number]     3. HTN - elevated here but just took meds, home bp's at goal - continue current meds   F/u 6 months  Dorn PHEBE Ross, M.D

## 2024-07-08 ENCOUNTER — Other Ambulatory Visit (HOSPITAL_COMMUNITY): Payer: Self-pay | Admitting: Student

## 2024-07-08 DIAGNOSIS — R159 Full incontinence of feces: Secondary | ICD-10-CM

## 2024-07-08 DIAGNOSIS — M48061 Spinal stenosis, lumbar region without neurogenic claudication: Secondary | ICD-10-CM

## 2024-07-13 NOTE — Progress Notes (Unsigned)
   07/13/2024  Patient ID: [name] [name], male   DOB: 08/11/1946, 78 y.o.   MRN: [id number]  This patient is appearing on a report for being at risk of failing the adherence measure for diabetes medications this calendar year.   Medication: metformin 500 mg tablets Patient discontinued medication.   Insurance report was not up to date. No action needed at this time.   Zelma Mazariego C. Makayah Pauli [facility] PharmD Candidate Class of [phone]

## 2024-07-16 ENCOUNTER — Ambulatory Visit (HOSPITAL_COMMUNITY)
Admission: RE | Admit: 2024-07-16 | Discharge: 2024-07-16 | Disposition: A | Discharge status: Home / Self Care | Source: Ambulatory Visit | Attending: Student | Admitting: Student

## 2024-07-16 DIAGNOSIS — M48061 Spinal stenosis, lumbar region without neurogenic claudication: Secondary | ICD-10-CM | POA: Diagnosis present

## 2024-07-16 DIAGNOSIS — M51369 Other intervertebral disc degeneration, lumbar region without mention of lumbar back pain or lower extremity pain: Secondary | ICD-10-CM

## 2024-07-16 DIAGNOSIS — M47816 Spondylosis without myelopathy or radiculopathy, lumbar region: Secondary | ICD-10-CM | POA: Diagnosis not present

## 2024-07-16 DIAGNOSIS — R159 Full incontinence of feces: Secondary | ICD-10-CM | POA: Diagnosis present

## 2024-07-16 DIAGNOSIS — S32059A Unspecified fracture of fifth lumbar vertebra, initial encounter for closed fracture: Secondary | ICD-10-CM | POA: Diagnosis not present

## 2024-07-16 MED ORDER — GADOBUTROL 1 MMOL/ML IV SOLN
9.0000 mL | Freq: Once | INTRAVENOUS | Status: AC | PRN
  Administered 2024-07-16: 9 mL via INTRAVENOUS
